# Patient Record
Sex: Female | Born: 1950 | Race: White | Hispanic: No | Marital: Married
Health system: Southern US, Academic
[De-identification: ages and names within clinical notes are randomized; demographics above are authoritative.]

## PROBLEM LIST (undated history)

## (undated) ENCOUNTER — Encounter: Attending: Nephrology | Primary: Nephrology

## (undated) ENCOUNTER — Encounter

## (undated) ENCOUNTER — Telehealth

## (undated) ENCOUNTER — Ambulatory Visit: Payer: MEDICARE

## (undated) ENCOUNTER — Ambulatory Visit: Payer: MEDICARE | Attending: Internal Medicine | Primary: Internal Medicine

## (undated) ENCOUNTER — Encounter: Attending: Cardiovascular Disease | Primary: Cardiovascular Disease

## (undated) ENCOUNTER — Encounter: Attending: Adult Health | Primary: Adult Health

## (undated) ENCOUNTER — Telehealth: Attending: Hematology & Oncology | Primary: Hematology & Oncology

## (undated) ENCOUNTER — Encounter: Attending: Infectious Disease | Primary: Infectious Disease

## (undated) ENCOUNTER — Encounter: Attending: Oncology | Primary: Oncology

## (undated) ENCOUNTER — Ambulatory Visit

## (undated) ENCOUNTER — Ambulatory Visit: Attending: Clinical | Primary: Clinical

## (undated) ENCOUNTER — Ambulatory Visit: Payer: MEDICARE | Attending: Nephrology | Primary: Nephrology

## (undated) ENCOUNTER — Encounter: Attending: Internal Medicine | Primary: Internal Medicine

## (undated) ENCOUNTER — Encounter: Attending: Hematology & Oncology | Primary: Hematology & Oncology

## (undated) ENCOUNTER — Other Ambulatory Visit: Attending: Clinical | Primary: Clinical

## (undated) ENCOUNTER — Ambulatory Visit: Attending: Nurse Practitioner | Primary: Nurse Practitioner

## (undated) ENCOUNTER — Ambulatory Visit: Payer: MEDICARE | Attending: Adult Health | Primary: Adult Health

## (undated) ENCOUNTER — Encounter: Attending: Dermatology | Primary: Dermatology

## (undated) ENCOUNTER — Ambulatory Visit
Payer: MEDICARE | Attending: Adult Reconstructive Orthopaedic Surgery | Primary: Adult Reconstructive Orthopaedic Surgery

## (undated) ENCOUNTER — Ambulatory Visit: Payer: MEDICARE | Attending: MOHS-Micrographic Surgery | Primary: MOHS-Micrographic Surgery

## (undated) ENCOUNTER — Ambulatory Visit: Payer: MEDICARE | Attending: Oncology | Primary: Oncology

## (undated) ENCOUNTER — Telehealth: Attending: Nephrology | Primary: Nephrology

## (undated) ENCOUNTER — Telehealth: Attending: Internal Medicine | Primary: Internal Medicine

## (undated) ENCOUNTER — Encounter: Attending: Clinical | Primary: Clinical

## (undated) ENCOUNTER — Encounter: Attending: Nurse Practitioner | Primary: Nurse Practitioner

## (undated) ENCOUNTER — Encounter
Attending: Student in an Organized Health Care Education/Training Program | Primary: Student in an Organized Health Care Education/Training Program

## (undated) ENCOUNTER — Ambulatory Visit: Payer: MEDICARE | Attending: Gerontology | Primary: Gerontology

## (undated) ENCOUNTER — Telehealth: Attending: Adult Health | Primary: Adult Health

## (undated) ENCOUNTER — Ambulatory Visit: Attending: Radiation Oncology | Primary: Radiation Oncology

## (undated) ENCOUNTER — Ambulatory Visit: Payer: MEDICARE | Attending: Dermatology | Primary: Dermatology

## (undated) ENCOUNTER — Ambulatory Visit
Payer: MEDICARE | Attending: Student in an Organized Health Care Education/Training Program | Primary: Student in an Organized Health Care Education/Training Program

## (undated) ENCOUNTER — Encounter: Attending: Diagnostic Radiology | Primary: Diagnostic Radiology

## (undated) ENCOUNTER — Ambulatory Visit
Payer: MEDICARE | Attending: Rehabilitative and Restorative Service Providers" | Primary: Rehabilitative and Restorative Service Providers"

## (undated) ENCOUNTER — Telehealth: Attending: MOHS-Micrographic Surgery | Primary: MOHS-Micrographic Surgery

## (undated) ENCOUNTER — Other Ambulatory Visit

## (undated) DIAGNOSIS — N19 Unspecified kidney failure: Secondary | ICD-10-CM

## (undated) DIAGNOSIS — M109 Gout, unspecified: Secondary | ICD-10-CM

## (undated) DIAGNOSIS — D473 Essential (hemorrhagic) thrombocythemia: Secondary | ICD-10-CM

## (undated) DIAGNOSIS — D649 Anemia, unspecified: Secondary | ICD-10-CM

## (undated) DIAGNOSIS — G4730 Sleep apnea, unspecified: Secondary | ICD-10-CM

## (undated) DIAGNOSIS — N189 Chronic kidney disease, unspecified: Secondary | ICD-10-CM

## (undated) DIAGNOSIS — R011 Cardiac murmur, unspecified: Secondary | ICD-10-CM

## (undated) DIAGNOSIS — M1990 Unspecified osteoarthritis, unspecified site: Secondary | ICD-10-CM

## (undated) DIAGNOSIS — I509 Heart failure, unspecified: Secondary | ICD-10-CM

## (undated) DIAGNOSIS — J811 Chronic pulmonary edema: Principal | ICD-10-CM

## (undated) DIAGNOSIS — N390 Urinary tract infection, site not specified: Secondary | ICD-10-CM

## (undated) DIAGNOSIS — I10 Essential (primary) hypertension: Secondary | ICD-10-CM

## (undated) HISTORY — PX: TUBAL LIGATION: SHX77

## (undated) HISTORY — DX: Chronic kidney disease, unspecified: N18.9

## (undated) HISTORY — DX: Sleep apnea, unspecified: G47.30

## (undated) HISTORY — DX: Chronic pulmonary edema: J81.1

## (undated) HISTORY — DX: Anemia, unspecified: D64.9

## (undated) HISTORY — PX: HAND SURGERY: SHX662

## (undated) HISTORY — DX: Gout, unspecified: M10.9

## (undated) HISTORY — PX: COLONOSCOPY: SHX174

## (undated) HISTORY — PX: PORT-A-CATH REMOVAL: SHX5289

## (undated) HISTORY — DX: Unspecified kidney failure: N19

## (undated) HISTORY — DX: Essential (hemorrhagic) thrombocythemia: D47.3

## (undated) HISTORY — PX: PORTACATH PLACEMENT: SHX2246

## (undated) MED ORDER — HYDROXYUREA 500 MG CAPSULE: 0 days

## (undated) MED ORDER — OXYGEN-AIR DELIVERY SYSTEMS MISC: 0 days

## (undated) MED ORDER — MEDICAL SUPPLY ITEM: Freq: Every evening | NASAL | 0 days

## (undated) MED ORDER — ARANESP 200 MCG/0.4 ML (IN POLYSORBATE) INJECTION SYRINGE: Freq: Once | SUBCUTANEOUS | 0.00000 days | PRN

---
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
## 1984-07-12 HISTORY — PX: BREAST BIOPSY: SHX20

## 2004-04-11 ENCOUNTER — Ambulatory Visit: Payer: Self-pay | Admitting: Internal Medicine

## 2004-06-12 ENCOUNTER — Ambulatory Visit: Payer: Self-pay | Admitting: Internal Medicine

## 2004-07-27 ENCOUNTER — Ambulatory Visit: Payer: Self-pay | Admitting: Internal Medicine

## 2004-10-09 ENCOUNTER — Ambulatory Visit: Payer: Self-pay | Admitting: Internal Medicine

## 2005-03-10 ENCOUNTER — Ambulatory Visit: Payer: Self-pay | Admitting: Internal Medicine

## 2005-03-12 ENCOUNTER — Ambulatory Visit: Payer: Self-pay | Admitting: Internal Medicine

## 2005-05-21 ENCOUNTER — Ambulatory Visit: Payer: Self-pay | Admitting: Internal Medicine

## 2005-06-07 ENCOUNTER — Ambulatory Visit: Payer: Self-pay | Admitting: Internal Medicine

## 2005-07-12 DIAGNOSIS — N189 Chronic kidney disease, unspecified: Secondary | ICD-10-CM

## 2005-07-12 HISTORY — DX: Chronic kidney disease, unspecified: N18.9

## 2005-08-23 ENCOUNTER — Ambulatory Visit: Payer: Self-pay | Admitting: Internal Medicine

## 2005-09-09 ENCOUNTER — Ambulatory Visit: Payer: Self-pay | Admitting: Internal Medicine

## 2005-10-10 ENCOUNTER — Ambulatory Visit: Payer: Self-pay | Admitting: Internal Medicine

## 2005-11-23 ENCOUNTER — Ambulatory Visit: Payer: Self-pay | Admitting: Internal Medicine

## 2006-01-13 ENCOUNTER — Ambulatory Visit: Payer: Self-pay | Admitting: Internal Medicine

## 2006-04-07 ENCOUNTER — Ambulatory Visit: Payer: Self-pay | Admitting: Internal Medicine

## 2006-04-11 ENCOUNTER — Ambulatory Visit: Payer: Self-pay | Admitting: Internal Medicine

## 2006-05-12 ENCOUNTER — Ambulatory Visit: Payer: Self-pay | Admitting: Internal Medicine

## 2006-06-11 ENCOUNTER — Ambulatory Visit: Payer: Self-pay | Admitting: Internal Medicine

## 2006-06-22 ENCOUNTER — Ambulatory Visit: Payer: Self-pay | Admitting: Gastroenterology

## 2006-07-12 ENCOUNTER — Ambulatory Visit: Payer: Self-pay | Admitting: Internal Medicine

## 2006-08-10 ENCOUNTER — Ambulatory Visit: Payer: Self-pay | Admitting: Internal Medicine

## 2006-08-12 ENCOUNTER — Ambulatory Visit: Payer: Self-pay | Admitting: Internal Medicine

## 2006-09-10 ENCOUNTER — Ambulatory Visit: Payer: Self-pay | Admitting: Internal Medicine

## 2006-10-11 ENCOUNTER — Ambulatory Visit: Payer: Self-pay | Admitting: Internal Medicine

## 2006-11-02 ENCOUNTER — Ambulatory Visit: Payer: Self-pay | Admitting: Internal Medicine

## 2006-11-10 ENCOUNTER — Ambulatory Visit: Payer: Self-pay | Admitting: Internal Medicine

## 2006-12-11 ENCOUNTER — Ambulatory Visit: Payer: Self-pay | Admitting: Internal Medicine

## 2007-01-16 ENCOUNTER — Ambulatory Visit: Payer: Self-pay | Admitting: Internal Medicine

## 2007-02-10 ENCOUNTER — Ambulatory Visit: Payer: Self-pay | Admitting: Internal Medicine

## 2007-03-13 ENCOUNTER — Ambulatory Visit: Payer: Self-pay | Admitting: Internal Medicine

## 2007-04-12 ENCOUNTER — Ambulatory Visit: Payer: Self-pay | Admitting: Internal Medicine

## 2007-05-13 ENCOUNTER — Ambulatory Visit: Payer: Self-pay | Admitting: Internal Medicine

## 2007-06-12 ENCOUNTER — Ambulatory Visit: Payer: Self-pay | Admitting: Internal Medicine

## 2007-07-13 ENCOUNTER — Ambulatory Visit: Payer: Self-pay | Admitting: Internal Medicine

## 2007-08-13 ENCOUNTER — Ambulatory Visit: Payer: Self-pay | Admitting: Internal Medicine

## 2007-09-10 ENCOUNTER — Ambulatory Visit: Payer: Self-pay | Admitting: Internal Medicine

## 2007-10-11 ENCOUNTER — Ambulatory Visit: Payer: Self-pay | Admitting: Internal Medicine

## 2007-11-10 ENCOUNTER — Ambulatory Visit: Payer: Self-pay | Admitting: Internal Medicine

## 2007-12-11 ENCOUNTER — Ambulatory Visit: Payer: Self-pay | Admitting: Internal Medicine

## 2008-01-10 ENCOUNTER — Ambulatory Visit: Payer: Self-pay | Admitting: Internal Medicine

## 2008-02-10 ENCOUNTER — Ambulatory Visit: Payer: Self-pay | Admitting: Internal Medicine

## 2008-03-12 ENCOUNTER — Ambulatory Visit: Payer: Self-pay | Admitting: Internal Medicine

## 2008-04-11 ENCOUNTER — Ambulatory Visit: Payer: Self-pay | Admitting: Internal Medicine

## 2008-05-12 ENCOUNTER — Ambulatory Visit: Payer: Self-pay | Admitting: Internal Medicine

## 2008-05-15 ENCOUNTER — Ambulatory Visit: Payer: Self-pay | Admitting: Internal Medicine

## 2008-06-11 ENCOUNTER — Ambulatory Visit: Payer: Self-pay | Admitting: Internal Medicine

## 2008-07-12 ENCOUNTER — Ambulatory Visit: Payer: Self-pay | Admitting: Internal Medicine

## 2008-07-12 LAB — HM COLONOSCOPY

## 2008-08-12 ENCOUNTER — Ambulatory Visit: Payer: Self-pay | Admitting: Internal Medicine

## 2008-08-28 ENCOUNTER — Ambulatory Visit: Payer: Self-pay | Admitting: General Practice

## 2008-09-09 ENCOUNTER — Ambulatory Visit: Payer: Self-pay | Admitting: Internal Medicine

## 2008-10-10 ENCOUNTER — Ambulatory Visit: Payer: Self-pay | Admitting: Internal Medicine

## 2008-11-09 ENCOUNTER — Ambulatory Visit: Payer: Self-pay | Admitting: Internal Medicine

## 2008-12-10 ENCOUNTER — Ambulatory Visit: Payer: Self-pay | Admitting: Internal Medicine

## 2009-01-09 ENCOUNTER — Ambulatory Visit: Payer: Self-pay | Admitting: Internal Medicine

## 2009-02-09 ENCOUNTER — Ambulatory Visit: Payer: Self-pay | Admitting: Internal Medicine

## 2009-03-12 ENCOUNTER — Ambulatory Visit: Payer: Self-pay | Admitting: Internal Medicine

## 2009-04-08 ENCOUNTER — Ambulatory Visit: Payer: Self-pay | Admitting: Internal Medicine

## 2009-04-11 ENCOUNTER — Ambulatory Visit: Payer: Self-pay | Admitting: Internal Medicine

## 2009-05-15 ENCOUNTER — Emergency Department: Payer: Self-pay | Admitting: Emergency Medicine

## 2009-07-12 ENCOUNTER — Ambulatory Visit: Payer: Self-pay | Admitting: Internal Medicine

## 2009-07-28 ENCOUNTER — Ambulatory Visit: Payer: Self-pay | Admitting: Internal Medicine

## 2009-08-12 ENCOUNTER — Ambulatory Visit: Payer: Self-pay | Admitting: Internal Medicine

## 2009-09-02 ENCOUNTER — Ambulatory Visit: Payer: Self-pay | Admitting: Internal Medicine

## 2009-11-09 ENCOUNTER — Ambulatory Visit: Payer: Self-pay | Admitting: Internal Medicine

## 2009-11-17 ENCOUNTER — Ambulatory Visit: Payer: Self-pay | Admitting: Internal Medicine

## 2010-01-09 ENCOUNTER — Ambulatory Visit: Payer: Self-pay | Admitting: Internal Medicine

## 2010-05-04 ENCOUNTER — Ambulatory Visit: Payer: Self-pay | Admitting: Internal Medicine

## 2010-05-12 ENCOUNTER — Ambulatory Visit: Payer: Self-pay | Admitting: Internal Medicine

## 2010-09-09 ENCOUNTER — Ambulatory Visit: Payer: Self-pay | Admitting: Internal Medicine

## 2010-10-14 ENCOUNTER — Ambulatory Visit: Payer: Self-pay | Admitting: Internal Medicine

## 2010-11-10 ENCOUNTER — Ambulatory Visit: Payer: Self-pay | Admitting: Internal Medicine

## 2011-03-31 ENCOUNTER — Ambulatory Visit: Payer: Self-pay | Admitting: Internal Medicine

## 2011-04-12 ENCOUNTER — Ambulatory Visit: Payer: Self-pay | Admitting: Internal Medicine

## 2011-07-15 DIAGNOSIS — N189 Chronic kidney disease, unspecified: Secondary | ICD-10-CM | POA: Diagnosis not present

## 2011-07-15 DIAGNOSIS — D631 Anemia in chronic kidney disease: Secondary | ICD-10-CM | POA: Diagnosis not present

## 2011-07-27 DIAGNOSIS — N039 Chronic nephritic syndrome with unspecified morphologic changes: Secondary | ICD-10-CM | POA: Diagnosis not present

## 2011-07-27 DIAGNOSIS — N184 Chronic kidney disease, stage 4 (severe): Secondary | ICD-10-CM | POA: Diagnosis not present

## 2011-07-27 DIAGNOSIS — N189 Chronic kidney disease, unspecified: Secondary | ICD-10-CM | POA: Diagnosis not present

## 2011-08-10 DIAGNOSIS — N039 Chronic nephritic syndrome with unspecified morphologic changes: Secondary | ICD-10-CM | POA: Diagnosis not present

## 2011-08-10 DIAGNOSIS — N189 Chronic kidney disease, unspecified: Secondary | ICD-10-CM | POA: Diagnosis not present

## 2011-08-17 DIAGNOSIS — D631 Anemia in chronic kidney disease: Secondary | ICD-10-CM | POA: Diagnosis not present

## 2011-08-17 DIAGNOSIS — N189 Chronic kidney disease, unspecified: Secondary | ICD-10-CM | POA: Diagnosis not present

## 2011-08-24 DIAGNOSIS — N039 Chronic nephritic syndrome with unspecified morphologic changes: Secondary | ICD-10-CM | POA: Diagnosis not present

## 2011-08-24 DIAGNOSIS — N189 Chronic kidney disease, unspecified: Secondary | ICD-10-CM | POA: Diagnosis not present

## 2011-08-24 DIAGNOSIS — N184 Chronic kidney disease, stage 4 (severe): Secondary | ICD-10-CM | POA: Diagnosis not present

## 2011-09-01 DIAGNOSIS — N184 Chronic kidney disease, stage 4 (severe): Secondary | ICD-10-CM | POA: Diagnosis not present

## 2011-09-07 DIAGNOSIS — N184 Chronic kidney disease, stage 4 (severe): Secondary | ICD-10-CM | POA: Diagnosis not present

## 2011-09-07 DIAGNOSIS — N189 Chronic kidney disease, unspecified: Secondary | ICD-10-CM | POA: Diagnosis not present

## 2011-09-07 DIAGNOSIS — N039 Chronic nephritic syndrome with unspecified morphologic changes: Secondary | ICD-10-CM | POA: Diagnosis not present

## 2011-09-07 DIAGNOSIS — N200 Calculus of kidney: Secondary | ICD-10-CM | POA: Diagnosis not present

## 2011-09-15 ENCOUNTER — Ambulatory Visit: Payer: Self-pay | Admitting: Internal Medicine

## 2011-09-15 DIAGNOSIS — Z1231 Encounter for screening mammogram for malignant neoplasm of breast: Secondary | ICD-10-CM | POA: Diagnosis not present

## 2011-09-15 LAB — HM MAMMOGRAPHY: HM MAMMO: NEGATIVE

## 2011-09-20 ENCOUNTER — Ambulatory Visit: Payer: Self-pay | Admitting: Internal Medicine

## 2011-09-20 DIAGNOSIS — I129 Hypertensive chronic kidney disease with stage 1 through stage 4 chronic kidney disease, or unspecified chronic kidney disease: Secondary | ICD-10-CM | POA: Diagnosis not present

## 2011-09-20 DIAGNOSIS — N186 End stage renal disease: Secondary | ICD-10-CM | POA: Diagnosis not present

## 2011-09-20 DIAGNOSIS — E669 Obesity, unspecified: Secondary | ICD-10-CM | POA: Diagnosis not present

## 2011-09-20 DIAGNOSIS — Z79899 Other long term (current) drug therapy: Secondary | ICD-10-CM | POA: Diagnosis not present

## 2011-09-20 DIAGNOSIS — D473 Essential (hemorrhagic) thrombocythemia: Secondary | ICD-10-CM | POA: Diagnosis not present

## 2011-09-20 DIAGNOSIS — D631 Anemia in chronic kidney disease: Secondary | ICD-10-CM | POA: Diagnosis not present

## 2011-09-21 DIAGNOSIS — N039 Chronic nephritic syndrome with unspecified morphologic changes: Secondary | ICD-10-CM | POA: Diagnosis not present

## 2011-09-21 DIAGNOSIS — D473 Essential (hemorrhagic) thrombocythemia: Secondary | ICD-10-CM | POA: Diagnosis not present

## 2011-09-28 DIAGNOSIS — N039 Chronic nephritic syndrome with unspecified morphologic changes: Secondary | ICD-10-CM | POA: Diagnosis not present

## 2011-09-28 DIAGNOSIS — M109 Gout, unspecified: Secondary | ICD-10-CM | POA: Diagnosis not present

## 2011-09-28 DIAGNOSIS — D473 Essential (hemorrhagic) thrombocythemia: Secondary | ICD-10-CM | POA: Diagnosis not present

## 2011-10-11 ENCOUNTER — Ambulatory Visit: Payer: Self-pay | Admitting: Internal Medicine

## 2011-10-12 DIAGNOSIS — N039 Chronic nephritic syndrome with unspecified morphologic changes: Secondary | ICD-10-CM | POA: Diagnosis not present

## 2011-10-12 DIAGNOSIS — D473 Essential (hemorrhagic) thrombocythemia: Secondary | ICD-10-CM | POA: Diagnosis not present

## 2011-10-26 DIAGNOSIS — M109 Gout, unspecified: Secondary | ICD-10-CM | POA: Diagnosis not present

## 2011-10-26 DIAGNOSIS — D631 Anemia in chronic kidney disease: Secondary | ICD-10-CM | POA: Diagnosis not present

## 2011-11-09 DIAGNOSIS — N184 Chronic kidney disease, stage 4 (severe): Secondary | ICD-10-CM | POA: Diagnosis not present

## 2011-11-09 DIAGNOSIS — D631 Anemia in chronic kidney disease: Secondary | ICD-10-CM | POA: Diagnosis not present

## 2011-11-09 DIAGNOSIS — D473 Essential (hemorrhagic) thrombocythemia: Secondary | ICD-10-CM | POA: Diagnosis not present

## 2011-11-23 DIAGNOSIS — D473 Essential (hemorrhagic) thrombocythemia: Secondary | ICD-10-CM | POA: Diagnosis not present

## 2011-11-23 DIAGNOSIS — D631 Anemia in chronic kidney disease: Secondary | ICD-10-CM | POA: Diagnosis not present

## 2011-12-07 DIAGNOSIS — N039 Chronic nephritic syndrome with unspecified morphologic changes: Secondary | ICD-10-CM | POA: Diagnosis not present

## 2011-12-07 DIAGNOSIS — N184 Chronic kidney disease, stage 4 (severe): Secondary | ICD-10-CM | POA: Diagnosis not present

## 2011-12-07 DIAGNOSIS — D473 Essential (hemorrhagic) thrombocythemia: Secondary | ICD-10-CM | POA: Diagnosis not present

## 2011-12-08 DIAGNOSIS — M1990 Unspecified osteoarthritis, unspecified site: Secondary | ICD-10-CM | POA: Diagnosis not present

## 2011-12-21 DIAGNOSIS — D631 Anemia in chronic kidney disease: Secondary | ICD-10-CM | POA: Diagnosis not present

## 2011-12-21 DIAGNOSIS — D473 Essential (hemorrhagic) thrombocythemia: Secondary | ICD-10-CM | POA: Diagnosis not present

## 2012-01-04 DIAGNOSIS — D473 Essential (hemorrhagic) thrombocythemia: Secondary | ICD-10-CM | POA: Diagnosis not present

## 2012-01-04 DIAGNOSIS — N039 Chronic nephritic syndrome with unspecified morphologic changes: Secondary | ICD-10-CM | POA: Diagnosis not present

## 2012-01-05 DIAGNOSIS — N184 Chronic kidney disease, stage 4 (severe): Secondary | ICD-10-CM | POA: Diagnosis not present

## 2012-01-25 DIAGNOSIS — N184 Chronic kidney disease, stage 4 (severe): Secondary | ICD-10-CM | POA: Diagnosis not present

## 2012-01-25 DIAGNOSIS — D631 Anemia in chronic kidney disease: Secondary | ICD-10-CM | POA: Diagnosis not present

## 2012-01-25 DIAGNOSIS — D473 Essential (hemorrhagic) thrombocythemia: Secondary | ICD-10-CM | POA: Diagnosis not present

## 2012-02-01 DIAGNOSIS — N184 Chronic kidney disease, stage 4 (severe): Secondary | ICD-10-CM | POA: Diagnosis not present

## 2012-02-02 DIAGNOSIS — D473 Essential (hemorrhagic) thrombocythemia: Secondary | ICD-10-CM | POA: Diagnosis not present

## 2012-02-02 DIAGNOSIS — N039 Chronic nephritic syndrome with unspecified morphologic changes: Secondary | ICD-10-CM | POA: Diagnosis not present

## 2012-02-15 DIAGNOSIS — D473 Essential (hemorrhagic) thrombocythemia: Secondary | ICD-10-CM | POA: Diagnosis not present

## 2012-02-15 DIAGNOSIS — D631 Anemia in chronic kidney disease: Secondary | ICD-10-CM | POA: Diagnosis not present

## 2012-02-29 DIAGNOSIS — D473 Essential (hemorrhagic) thrombocythemia: Secondary | ICD-10-CM | POA: Diagnosis not present

## 2012-02-29 DIAGNOSIS — D631 Anemia in chronic kidney disease: Secondary | ICD-10-CM | POA: Diagnosis not present

## 2012-03-01 DIAGNOSIS — D473 Essential (hemorrhagic) thrombocythemia: Secondary | ICD-10-CM | POA: Diagnosis not present

## 2012-03-01 DIAGNOSIS — D631 Anemia in chronic kidney disease: Secondary | ICD-10-CM | POA: Diagnosis not present

## 2012-03-06 ENCOUNTER — Ambulatory Visit: Payer: Self-pay | Admitting: Internal Medicine

## 2012-03-06 DIAGNOSIS — Z79899 Other long term (current) drug therapy: Secondary | ICD-10-CM | POA: Diagnosis not present

## 2012-03-06 DIAGNOSIS — D473 Essential (hemorrhagic) thrombocythemia: Secondary | ICD-10-CM | POA: Diagnosis not present

## 2012-03-06 DIAGNOSIS — N189 Chronic kidney disease, unspecified: Secondary | ICD-10-CM | POA: Diagnosis not present

## 2012-03-06 DIAGNOSIS — D631 Anemia in chronic kidney disease: Secondary | ICD-10-CM | POA: Diagnosis not present

## 2012-03-06 DIAGNOSIS — E669 Obesity, unspecified: Secondary | ICD-10-CM | POA: Diagnosis not present

## 2012-03-06 DIAGNOSIS — I129 Hypertensive chronic kidney disease with stage 1 through stage 4 chronic kidney disease, or unspecified chronic kidney disease: Secondary | ICD-10-CM | POA: Diagnosis not present

## 2012-03-07 DIAGNOSIS — N186 End stage renal disease: Secondary | ICD-10-CM | POA: Diagnosis not present

## 2012-03-07 DIAGNOSIS — N184 Chronic kidney disease, stage 4 (severe): Secondary | ICD-10-CM | POA: Diagnosis not present

## 2012-03-07 DIAGNOSIS — D473 Essential (hemorrhagic) thrombocythemia: Secondary | ICD-10-CM | POA: Diagnosis not present

## 2012-03-07 DIAGNOSIS — D631 Anemia in chronic kidney disease: Secondary | ICD-10-CM | POA: Diagnosis not present

## 2012-03-12 ENCOUNTER — Ambulatory Visit: Payer: Self-pay | Admitting: Internal Medicine

## 2012-03-21 DIAGNOSIS — D473 Essential (hemorrhagic) thrombocythemia: Secondary | ICD-10-CM | POA: Diagnosis not present

## 2012-03-21 DIAGNOSIS — N039 Chronic nephritic syndrome with unspecified morphologic changes: Secondary | ICD-10-CM | POA: Diagnosis not present

## 2012-03-28 DIAGNOSIS — N039 Chronic nephritic syndrome with unspecified morphologic changes: Secondary | ICD-10-CM | POA: Diagnosis not present

## 2012-03-28 DIAGNOSIS — D473 Essential (hemorrhagic) thrombocythemia: Secondary | ICD-10-CM | POA: Diagnosis not present

## 2012-03-28 DIAGNOSIS — N184 Chronic kidney disease, stage 4 (severe): Secondary | ICD-10-CM | POA: Diagnosis not present

## 2012-04-10 DIAGNOSIS — N039 Chronic nephritic syndrome with unspecified morphologic changes: Secondary | ICD-10-CM | POA: Diagnosis not present

## 2012-04-10 DIAGNOSIS — D473 Essential (hemorrhagic) thrombocythemia: Secondary | ICD-10-CM | POA: Diagnosis not present

## 2012-04-25 DIAGNOSIS — D473 Essential (hemorrhagic) thrombocythemia: Secondary | ICD-10-CM | POA: Diagnosis not present

## 2012-04-25 DIAGNOSIS — N039 Chronic nephritic syndrome with unspecified morphologic changes: Secondary | ICD-10-CM | POA: Diagnosis not present

## 2012-05-01 DIAGNOSIS — R05 Cough: Secondary | ICD-10-CM | POA: Diagnosis not present

## 2012-05-01 DIAGNOSIS — Z23 Encounter for immunization: Secondary | ICD-10-CM | POA: Diagnosis not present

## 2012-05-02 ENCOUNTER — Ambulatory Visit: Payer: Self-pay | Admitting: Internal Medicine

## 2012-05-02 DIAGNOSIS — R059 Cough, unspecified: Secondary | ICD-10-CM | POA: Diagnosis not present

## 2012-05-02 DIAGNOSIS — D473 Essential (hemorrhagic) thrombocythemia: Secondary | ICD-10-CM | POA: Diagnosis not present

## 2012-05-02 DIAGNOSIS — I517 Cardiomegaly: Secondary | ICD-10-CM | POA: Diagnosis not present

## 2012-05-02 DIAGNOSIS — N039 Chronic nephritic syndrome with unspecified morphologic changes: Secondary | ICD-10-CM | POA: Diagnosis not present

## 2012-05-02 DIAGNOSIS — R05 Cough: Secondary | ICD-10-CM | POA: Diagnosis not present

## 2012-05-02 DIAGNOSIS — N184 Chronic kidney disease, stage 4 (severe): Secondary | ICD-10-CM | POA: Diagnosis not present

## 2012-05-16 DIAGNOSIS — I517 Cardiomegaly: Secondary | ICD-10-CM | POA: Diagnosis not present

## 2012-05-16 DIAGNOSIS — Z01818 Encounter for other preprocedural examination: Secondary | ICD-10-CM | POA: Diagnosis not present

## 2012-05-16 DIAGNOSIS — R9431 Abnormal electrocardiogram [ECG] [EKG]: Secondary | ICD-10-CM | POA: Diagnosis not present

## 2012-05-16 DIAGNOSIS — N184 Chronic kidney disease, stage 4 (severe): Secondary | ICD-10-CM | POA: Diagnosis not present

## 2012-05-16 DIAGNOSIS — I2510 Atherosclerotic heart disease of native coronary artery without angina pectoris: Secondary | ICD-10-CM | POA: Diagnosis not present

## 2012-05-23 DIAGNOSIS — N039 Chronic nephritic syndrome with unspecified morphologic changes: Secondary | ICD-10-CM | POA: Diagnosis not present

## 2012-05-23 DIAGNOSIS — D473 Essential (hemorrhagic) thrombocythemia: Secondary | ICD-10-CM | POA: Diagnosis not present

## 2012-06-13 DIAGNOSIS — D473 Essential (hemorrhagic) thrombocythemia: Secondary | ICD-10-CM | POA: Diagnosis not present

## 2012-06-13 DIAGNOSIS — N184 Chronic kidney disease, stage 4 (severe): Secondary | ICD-10-CM | POA: Diagnosis not present

## 2012-06-13 DIAGNOSIS — D631 Anemia in chronic kidney disease: Secondary | ICD-10-CM | POA: Diagnosis not present

## 2012-06-27 DIAGNOSIS — D473 Essential (hemorrhagic) thrombocythemia: Secondary | ICD-10-CM | POA: Diagnosis not present

## 2012-06-27 DIAGNOSIS — D631 Anemia in chronic kidney disease: Secondary | ICD-10-CM | POA: Diagnosis not present

## 2012-07-03 DIAGNOSIS — D473 Essential (hemorrhagic) thrombocythemia: Secondary | ICD-10-CM | POA: Diagnosis not present

## 2012-07-03 DIAGNOSIS — D631 Anemia in chronic kidney disease: Secondary | ICD-10-CM | POA: Diagnosis not present

## 2012-07-12 DIAGNOSIS — N19 Unspecified kidney failure: Secondary | ICD-10-CM

## 2012-07-12 DIAGNOSIS — J811 Chronic pulmonary edema: Secondary | ICD-10-CM

## 2012-07-12 HISTORY — DX: Unspecified kidney failure: N19

## 2012-07-12 HISTORY — DX: Chronic pulmonary edema: J81.1

## 2012-07-17 DIAGNOSIS — N039 Chronic nephritic syndrome with unspecified morphologic changes: Secondary | ICD-10-CM | POA: Diagnosis not present

## 2012-07-17 DIAGNOSIS — D473 Essential (hemorrhagic) thrombocythemia: Secondary | ICD-10-CM | POA: Diagnosis not present

## 2012-07-25 DIAGNOSIS — N184 Chronic kidney disease, stage 4 (severe): Secondary | ICD-10-CM | POA: Diagnosis not present

## 2012-07-25 DIAGNOSIS — N039 Chronic nephritic syndrome with unspecified morphologic changes: Secondary | ICD-10-CM | POA: Diagnosis not present

## 2012-08-07 DIAGNOSIS — D631 Anemia in chronic kidney disease: Secondary | ICD-10-CM | POA: Diagnosis not present

## 2012-08-07 DIAGNOSIS — D473 Essential (hemorrhagic) thrombocythemia: Secondary | ICD-10-CM | POA: Diagnosis not present

## 2012-08-12 ENCOUNTER — Ambulatory Visit: Payer: Self-pay | Admitting: Internal Medicine

## 2012-08-12 DIAGNOSIS — E669 Obesity, unspecified: Secondary | ICD-10-CM | POA: Diagnosis not present

## 2012-08-12 DIAGNOSIS — D631 Anemia in chronic kidney disease: Secondary | ICD-10-CM | POA: Diagnosis not present

## 2012-08-12 DIAGNOSIS — D473 Essential (hemorrhagic) thrombocythemia: Secondary | ICD-10-CM | POA: Diagnosis not present

## 2012-08-12 DIAGNOSIS — I129 Hypertensive chronic kidney disease with stage 1 through stage 4 chronic kidney disease, or unspecified chronic kidney disease: Secondary | ICD-10-CM | POA: Diagnosis not present

## 2012-08-12 DIAGNOSIS — N189 Chronic kidney disease, unspecified: Secondary | ICD-10-CM | POA: Diagnosis not present

## 2012-08-12 DIAGNOSIS — Z79899 Other long term (current) drug therapy: Secondary | ICD-10-CM | POA: Diagnosis not present

## 2012-08-12 DIAGNOSIS — R5381 Other malaise: Secondary | ICD-10-CM | POA: Diagnosis not present

## 2012-08-12 LAB — HM MAMMOGRAPHY

## 2012-08-15 DIAGNOSIS — N184 Chronic kidney disease, stage 4 (severe): Secondary | ICD-10-CM | POA: Diagnosis not present

## 2012-08-15 DIAGNOSIS — D631 Anemia in chronic kidney disease: Secondary | ICD-10-CM | POA: Diagnosis not present

## 2012-08-15 DIAGNOSIS — D473 Essential (hemorrhagic) thrombocythemia: Secondary | ICD-10-CM | POA: Diagnosis not present

## 2012-08-17 DIAGNOSIS — N189 Chronic kidney disease, unspecified: Secondary | ICD-10-CM | POA: Diagnosis not present

## 2012-08-23 DIAGNOSIS — D631 Anemia in chronic kidney disease: Secondary | ICD-10-CM | POA: Diagnosis not present

## 2012-08-23 DIAGNOSIS — D473 Essential (hemorrhagic) thrombocythemia: Secondary | ICD-10-CM | POA: Diagnosis not present

## 2012-08-25 DIAGNOSIS — D631 Anemia in chronic kidney disease: Secondary | ICD-10-CM | POA: Diagnosis not present

## 2012-08-25 DIAGNOSIS — Z79899 Other long term (current) drug therapy: Secondary | ICD-10-CM | POA: Diagnosis not present

## 2012-08-25 DIAGNOSIS — D473 Essential (hemorrhagic) thrombocythemia: Secondary | ICD-10-CM | POA: Diagnosis not present

## 2012-08-25 DIAGNOSIS — N189 Chronic kidney disease, unspecified: Secondary | ICD-10-CM | POA: Diagnosis not present

## 2012-08-29 DIAGNOSIS — N189 Chronic kidney disease, unspecified: Secondary | ICD-10-CM | POA: Diagnosis not present

## 2012-08-29 DIAGNOSIS — N184 Chronic kidney disease, stage 4 (severe): Secondary | ICD-10-CM | POA: Diagnosis not present

## 2012-08-29 DIAGNOSIS — D62 Acute posthemorrhagic anemia: Secondary | ICD-10-CM | POA: Diagnosis not present

## 2012-08-29 DIAGNOSIS — Z79899 Other long term (current) drug therapy: Secondary | ICD-10-CM | POA: Diagnosis not present

## 2012-08-29 DIAGNOSIS — D473 Essential (hemorrhagic) thrombocythemia: Secondary | ICD-10-CM | POA: Diagnosis not present

## 2012-08-29 DIAGNOSIS — I129 Hypertensive chronic kidney disease with stage 1 through stage 4 chronic kidney disease, or unspecified chronic kidney disease: Secondary | ICD-10-CM | POA: Diagnosis not present

## 2012-08-29 DIAGNOSIS — M109 Gout, unspecified: Secondary | ICD-10-CM | POA: Diagnosis not present

## 2012-09-05 DIAGNOSIS — D62 Acute posthemorrhagic anemia: Secondary | ICD-10-CM | POA: Diagnosis not present

## 2012-09-09 ENCOUNTER — Ambulatory Visit: Payer: Self-pay | Admitting: Internal Medicine

## 2012-09-12 DIAGNOSIS — D473 Essential (hemorrhagic) thrombocythemia: Secondary | ICD-10-CM | POA: Diagnosis not present

## 2012-09-21 ENCOUNTER — Ambulatory Visit: Payer: Self-pay | Admitting: Internal Medicine

## 2012-09-21 DIAGNOSIS — Z1231 Encounter for screening mammogram for malignant neoplasm of breast: Secondary | ICD-10-CM | POA: Diagnosis not present

## 2012-09-22 ENCOUNTER — Ambulatory Visit: Payer: Self-pay | Admitting: Internal Medicine

## 2012-09-22 DIAGNOSIS — Z79899 Other long term (current) drug therapy: Secondary | ICD-10-CM | POA: Diagnosis not present

## 2012-09-22 DIAGNOSIS — I129 Hypertensive chronic kidney disease with stage 1 through stage 4 chronic kidney disease, or unspecified chronic kidney disease: Secondary | ICD-10-CM | POA: Diagnosis not present

## 2012-09-22 DIAGNOSIS — R5381 Other malaise: Secondary | ICD-10-CM | POA: Diagnosis not present

## 2012-09-22 DIAGNOSIS — E669 Obesity, unspecified: Secondary | ICD-10-CM | POA: Diagnosis not present

## 2012-09-22 DIAGNOSIS — N186 End stage renal disease: Secondary | ICD-10-CM | POA: Diagnosis not present

## 2012-09-22 DIAGNOSIS — D631 Anemia in chronic kidney disease: Secondary | ICD-10-CM | POA: Diagnosis not present

## 2012-09-22 DIAGNOSIS — D473 Essential (hemorrhagic) thrombocythemia: Secondary | ICD-10-CM | POA: Diagnosis not present

## 2012-10-03 ENCOUNTER — Ambulatory Visit: Payer: Self-pay | Admitting: Internal Medicine

## 2012-10-03 DIAGNOSIS — J811 Chronic pulmonary edema: Secondary | ICD-10-CM | POA: Diagnosis not present

## 2012-10-03 DIAGNOSIS — R059 Cough, unspecified: Secondary | ICD-10-CM | POA: Diagnosis not present

## 2012-10-03 DIAGNOSIS — I517 Cardiomegaly: Secondary | ICD-10-CM | POA: Diagnosis not present

## 2012-10-06 DIAGNOSIS — I319 Disease of pericardium, unspecified: Secondary | ICD-10-CM | POA: Diagnosis not present

## 2012-10-06 DIAGNOSIS — Z87891 Personal history of nicotine dependence: Secondary | ICD-10-CM | POA: Diagnosis not present

## 2012-10-06 DIAGNOSIS — J984 Other disorders of lung: Secondary | ICD-10-CM | POA: Diagnosis not present

## 2012-10-06 DIAGNOSIS — I517 Cardiomegaly: Secondary | ICD-10-CM | POA: Diagnosis not present

## 2012-10-06 DIAGNOSIS — N184 Chronic kidney disease, stage 4 (severe): Secondary | ICD-10-CM | POA: Diagnosis not present

## 2012-10-06 DIAGNOSIS — R079 Chest pain, unspecified: Secondary | ICD-10-CM | POA: Diagnosis not present

## 2012-10-06 DIAGNOSIS — J90 Pleural effusion, not elsewhere classified: Secondary | ICD-10-CM | POA: Diagnosis not present

## 2012-10-06 DIAGNOSIS — N186 End stage renal disease: Secondary | ICD-10-CM | POA: Diagnosis not present

## 2012-10-06 DIAGNOSIS — N19 Unspecified kidney failure: Secondary | ICD-10-CM | POA: Diagnosis not present

## 2012-10-06 DIAGNOSIS — I2789 Other specified pulmonary heart diseases: Secondary | ICD-10-CM | POA: Diagnosis not present

## 2012-10-06 DIAGNOSIS — K7689 Other specified diseases of liver: Secondary | ICD-10-CM | POA: Diagnosis not present

## 2012-10-06 DIAGNOSIS — N039 Chronic nephritic syndrome with unspecified morphologic changes: Secondary | ICD-10-CM | POA: Diagnosis not present

## 2012-10-06 DIAGNOSIS — Z7682 Awaiting organ transplant status: Secondary | ICD-10-CM | POA: Diagnosis not present

## 2012-10-06 DIAGNOSIS — G4733 Obstructive sleep apnea (adult) (pediatric): Secondary | ICD-10-CM | POA: Diagnosis present

## 2012-10-06 DIAGNOSIS — R0602 Shortness of breath: Secondary | ICD-10-CM | POA: Diagnosis not present

## 2012-10-06 DIAGNOSIS — N008 Acute nephritic syndrome with other morphologic changes: Secondary | ICD-10-CM | POA: Diagnosis not present

## 2012-10-06 DIAGNOSIS — R0609 Other forms of dyspnea: Secondary | ICD-10-CM | POA: Diagnosis not present

## 2012-10-06 DIAGNOSIS — N179 Acute kidney failure, unspecified: Secondary | ICD-10-CM | POA: Diagnosis not present

## 2012-10-06 DIAGNOSIS — E669 Obesity, unspecified: Secondary | ICD-10-CM | POA: Diagnosis present

## 2012-10-06 DIAGNOSIS — I428 Other cardiomyopathies: Secondary | ICD-10-CM | POA: Diagnosis not present

## 2012-10-06 DIAGNOSIS — J810 Acute pulmonary edema: Secondary | ICD-10-CM | POA: Diagnosis present

## 2012-10-06 DIAGNOSIS — Q2572 Congenital pulmonary arteriovenous malformation: Secondary | ICD-10-CM | POA: Diagnosis not present

## 2012-10-06 DIAGNOSIS — D649 Anemia, unspecified: Secondary | ICD-10-CM | POA: Diagnosis not present

## 2012-10-06 DIAGNOSIS — R918 Other nonspecific abnormal finding of lung field: Secondary | ICD-10-CM | POA: Diagnosis not present

## 2012-10-06 DIAGNOSIS — D631 Anemia in chronic kidney disease: Secondary | ICD-10-CM | POA: Diagnosis present

## 2012-10-06 DIAGNOSIS — I120 Hypertensive chronic kidney disease with stage 5 chronic kidney disease or end stage renal disease: Secondary | ICD-10-CM | POA: Diagnosis not present

## 2012-10-06 DIAGNOSIS — R0902 Hypoxemia: Secondary | ICD-10-CM | POA: Diagnosis not present

## 2012-10-06 DIAGNOSIS — M7989 Other specified soft tissue disorders: Secondary | ICD-10-CM | POA: Diagnosis not present

## 2012-10-06 DIAGNOSIS — I509 Heart failure, unspecified: Secondary | ICD-10-CM | POA: Diagnosis not present

## 2012-10-06 DIAGNOSIS — I780 Hereditary hemorrhagic telangiectasia: Secondary | ICD-10-CM | POA: Diagnosis present

## 2012-10-06 DIAGNOSIS — J9819 Other pulmonary collapse: Secondary | ICD-10-CM | POA: Diagnosis not present

## 2012-10-06 DIAGNOSIS — I270 Primary pulmonary hypertension: Secondary | ICD-10-CM | POA: Diagnosis not present

## 2012-10-06 DIAGNOSIS — I129 Hypertensive chronic kidney disease with stage 1 through stage 4 chronic kidney disease, or unspecified chronic kidney disease: Secondary | ICD-10-CM | POA: Diagnosis not present

## 2012-10-06 DIAGNOSIS — D473 Essential (hemorrhagic) thrombocythemia: Secondary | ICD-10-CM | POA: Diagnosis present

## 2012-10-06 DIAGNOSIS — Z992 Dependence on renal dialysis: Secondary | ICD-10-CM | POA: Diagnosis not present

## 2012-10-06 DIAGNOSIS — M109 Gout, unspecified: Secondary | ICD-10-CM | POA: Diagnosis present

## 2012-10-06 DIAGNOSIS — J811 Chronic pulmonary edema: Secondary | ICD-10-CM | POA: Diagnosis not present

## 2012-10-06 DIAGNOSIS — Z452 Encounter for adjustment and management of vascular access device: Secondary | ICD-10-CM | POA: Diagnosis not present

## 2012-10-06 DIAGNOSIS — I5030 Unspecified diastolic (congestive) heart failure: Secondary | ICD-10-CM | POA: Diagnosis not present

## 2012-10-10 ENCOUNTER — Ambulatory Visit: Payer: Self-pay | Admitting: Internal Medicine

## 2012-10-28 DIAGNOSIS — D473 Essential (hemorrhagic) thrombocythemia: Secondary | ICD-10-CM | POA: Diagnosis not present

## 2012-10-28 DIAGNOSIS — D509 Iron deficiency anemia, unspecified: Secondary | ICD-10-CM | POA: Diagnosis not present

## 2012-10-28 DIAGNOSIS — N039 Chronic nephritic syndrome with unspecified morphologic changes: Secondary | ICD-10-CM | POA: Diagnosis not present

## 2012-10-28 DIAGNOSIS — D631 Anemia in chronic kidney disease: Secondary | ICD-10-CM | POA: Diagnosis not present

## 2012-10-28 DIAGNOSIS — N186 End stage renal disease: Secondary | ICD-10-CM | POA: Diagnosis not present

## 2012-10-31 DIAGNOSIS — D509 Iron deficiency anemia, unspecified: Secondary | ICD-10-CM | POA: Diagnosis not present

## 2012-10-31 DIAGNOSIS — N186 End stage renal disease: Secondary | ICD-10-CM | POA: Diagnosis not present

## 2012-10-31 DIAGNOSIS — D473 Essential (hemorrhagic) thrombocythemia: Secondary | ICD-10-CM | POA: Diagnosis not present

## 2012-10-31 DIAGNOSIS — D631 Anemia in chronic kidney disease: Secondary | ICD-10-CM | POA: Diagnosis not present

## 2012-11-02 DIAGNOSIS — N039 Chronic nephritic syndrome with unspecified morphologic changes: Secondary | ICD-10-CM | POA: Diagnosis not present

## 2012-11-02 DIAGNOSIS — N186 End stage renal disease: Secondary | ICD-10-CM | POA: Diagnosis not present

## 2012-11-02 DIAGNOSIS — D473 Essential (hemorrhagic) thrombocythemia: Secondary | ICD-10-CM | POA: Diagnosis not present

## 2012-11-02 DIAGNOSIS — D509 Iron deficiency anemia, unspecified: Secondary | ICD-10-CM | POA: Diagnosis not present

## 2012-11-03 DIAGNOSIS — J8410 Pulmonary fibrosis, unspecified: Secondary | ICD-10-CM | POA: Diagnosis not present

## 2012-11-03 DIAGNOSIS — E8779 Other fluid overload: Secondary | ICD-10-CM | POA: Diagnosis not present

## 2012-11-03 DIAGNOSIS — D649 Anemia, unspecified: Secondary | ICD-10-CM | POA: Diagnosis not present

## 2012-11-03 DIAGNOSIS — R0602 Shortness of breath: Secondary | ICD-10-CM | POA: Diagnosis not present

## 2012-11-03 DIAGNOSIS — J90 Pleural effusion, not elsewhere classified: Secondary | ICD-10-CM | POA: Diagnosis not present

## 2012-11-03 DIAGNOSIS — R0609 Other forms of dyspnea: Secondary | ICD-10-CM | POA: Diagnosis not present

## 2012-11-03 DIAGNOSIS — J811 Chronic pulmonary edema: Secondary | ICD-10-CM | POA: Diagnosis not present

## 2012-11-03 DIAGNOSIS — I517 Cardiomegaly: Secondary | ICD-10-CM | POA: Diagnosis not present

## 2012-11-03 DIAGNOSIS — R0902 Hypoxemia: Secondary | ICD-10-CM | POA: Diagnosis not present

## 2012-11-03 DIAGNOSIS — R0989 Other specified symptoms and signs involving the circulatory and respiratory systems: Secondary | ICD-10-CM | POA: Diagnosis not present

## 2012-11-04 DIAGNOSIS — D473 Essential (hemorrhagic) thrombocythemia: Secondary | ICD-10-CM | POA: Diagnosis not present

## 2012-11-04 DIAGNOSIS — N186 End stage renal disease: Secondary | ICD-10-CM | POA: Diagnosis not present

## 2012-11-04 DIAGNOSIS — N039 Chronic nephritic syndrome with unspecified morphologic changes: Secondary | ICD-10-CM | POA: Diagnosis not present

## 2012-11-04 DIAGNOSIS — D509 Iron deficiency anemia, unspecified: Secondary | ICD-10-CM | POA: Diagnosis not present

## 2012-11-07 DIAGNOSIS — D509 Iron deficiency anemia, unspecified: Secondary | ICD-10-CM | POA: Diagnosis not present

## 2012-11-07 DIAGNOSIS — D473 Essential (hemorrhagic) thrombocythemia: Secondary | ICD-10-CM | POA: Diagnosis not present

## 2012-11-07 DIAGNOSIS — N186 End stage renal disease: Secondary | ICD-10-CM | POA: Diagnosis not present

## 2012-11-07 DIAGNOSIS — D631 Anemia in chronic kidney disease: Secondary | ICD-10-CM | POA: Diagnosis not present

## 2012-11-08 DIAGNOSIS — J90 Pleural effusion, not elsewhere classified: Secondary | ICD-10-CM | POA: Diagnosis not present

## 2012-11-08 DIAGNOSIS — R9389 Abnormal findings on diagnostic imaging of other specified body structures: Secondary | ICD-10-CM | POA: Diagnosis not present

## 2012-11-08 DIAGNOSIS — I319 Disease of pericardium, unspecified: Secondary | ICD-10-CM | POA: Diagnosis not present

## 2012-11-08 DIAGNOSIS — I700 Atherosclerosis of aorta: Secondary | ICD-10-CM | POA: Diagnosis not present

## 2012-11-08 DIAGNOSIS — I517 Cardiomegaly: Secondary | ICD-10-CM | POA: Diagnosis not present

## 2012-11-08 DIAGNOSIS — R918 Other nonspecific abnormal finding of lung field: Secondary | ICD-10-CM | POA: Diagnosis not present

## 2012-11-08 DIAGNOSIS — J9819 Other pulmonary collapse: Secondary | ICD-10-CM | POA: Diagnosis not present

## 2012-11-09 DIAGNOSIS — N039 Chronic nephritic syndrome with unspecified morphologic changes: Secondary | ICD-10-CM | POA: Diagnosis not present

## 2012-11-09 DIAGNOSIS — D473 Essential (hemorrhagic) thrombocythemia: Secondary | ICD-10-CM | POA: Diagnosis not present

## 2012-11-09 DIAGNOSIS — D631 Anemia in chronic kidney disease: Secondary | ICD-10-CM | POA: Diagnosis not present

## 2012-11-09 DIAGNOSIS — N186 End stage renal disease: Secondary | ICD-10-CM | POA: Diagnosis not present

## 2012-11-10 DIAGNOSIS — I517 Cardiomegaly: Secondary | ICD-10-CM | POA: Diagnosis not present

## 2012-11-10 DIAGNOSIS — J811 Chronic pulmonary edema: Secondary | ICD-10-CM | POA: Diagnosis not present

## 2012-11-10 DIAGNOSIS — E8779 Other fluid overload: Secondary | ICD-10-CM | POA: Diagnosis not present

## 2012-11-10 DIAGNOSIS — R0902 Hypoxemia: Secondary | ICD-10-CM | POA: Diagnosis not present

## 2012-11-10 DIAGNOSIS — I10 Essential (primary) hypertension: Secondary | ICD-10-CM | POA: Diagnosis not present

## 2012-11-11 DIAGNOSIS — N039 Chronic nephritic syndrome with unspecified morphologic changes: Secondary | ICD-10-CM | POA: Diagnosis not present

## 2012-11-11 DIAGNOSIS — N186 End stage renal disease: Secondary | ICD-10-CM | POA: Diagnosis not present

## 2012-11-11 DIAGNOSIS — D473 Essential (hemorrhagic) thrombocythemia: Secondary | ICD-10-CM | POA: Diagnosis not present

## 2012-11-14 DIAGNOSIS — D473 Essential (hemorrhagic) thrombocythemia: Secondary | ICD-10-CM | POA: Diagnosis not present

## 2012-11-14 DIAGNOSIS — N186 End stage renal disease: Secondary | ICD-10-CM | POA: Diagnosis not present

## 2012-11-14 DIAGNOSIS — N039 Chronic nephritic syndrome with unspecified morphologic changes: Secondary | ICD-10-CM | POA: Diagnosis not present

## 2012-11-16 DIAGNOSIS — D631 Anemia in chronic kidney disease: Secondary | ICD-10-CM | POA: Diagnosis not present

## 2012-11-16 DIAGNOSIS — D473 Essential (hemorrhagic) thrombocythemia: Secondary | ICD-10-CM | POA: Diagnosis not present

## 2012-11-16 DIAGNOSIS — N186 End stage renal disease: Secondary | ICD-10-CM | POA: Diagnosis not present

## 2012-11-18 DIAGNOSIS — N039 Chronic nephritic syndrome with unspecified morphologic changes: Secondary | ICD-10-CM | POA: Diagnosis not present

## 2012-11-18 DIAGNOSIS — N186 End stage renal disease: Secondary | ICD-10-CM | POA: Diagnosis not present

## 2012-11-18 DIAGNOSIS — D473 Essential (hemorrhagic) thrombocythemia: Secondary | ICD-10-CM | POA: Diagnosis not present

## 2012-11-21 DIAGNOSIS — I509 Heart failure, unspecified: Secondary | ICD-10-CM | POA: Diagnosis not present

## 2012-11-21 DIAGNOSIS — N186 End stage renal disease: Secondary | ICD-10-CM | POA: Diagnosis not present

## 2012-11-21 DIAGNOSIS — N039 Chronic nephritic syndrome with unspecified morphologic changes: Secondary | ICD-10-CM | POA: Diagnosis not present

## 2012-11-23 DIAGNOSIS — I509 Heart failure, unspecified: Secondary | ICD-10-CM | POA: Diagnosis not present

## 2012-11-23 DIAGNOSIS — N039 Chronic nephritic syndrome with unspecified morphologic changes: Secondary | ICD-10-CM | POA: Diagnosis not present

## 2012-11-23 DIAGNOSIS — N186 End stage renal disease: Secondary | ICD-10-CM | POA: Diagnosis not present

## 2012-11-25 DIAGNOSIS — D631 Anemia in chronic kidney disease: Secondary | ICD-10-CM | POA: Diagnosis not present

## 2012-11-25 DIAGNOSIS — N186 End stage renal disease: Secondary | ICD-10-CM | POA: Diagnosis not present

## 2012-11-25 DIAGNOSIS — I509 Heart failure, unspecified: Secondary | ICD-10-CM | POA: Diagnosis not present

## 2012-11-28 DIAGNOSIS — D473 Essential (hemorrhagic) thrombocythemia: Secondary | ICD-10-CM | POA: Diagnosis not present

## 2012-11-28 DIAGNOSIS — N186 End stage renal disease: Secondary | ICD-10-CM | POA: Diagnosis not present

## 2012-11-28 DIAGNOSIS — N039 Chronic nephritic syndrome with unspecified morphologic changes: Secondary | ICD-10-CM | POA: Diagnosis not present

## 2012-11-29 DIAGNOSIS — D649 Anemia, unspecified: Secondary | ICD-10-CM | POA: Diagnosis not present

## 2012-11-30 DIAGNOSIS — N186 End stage renal disease: Secondary | ICD-10-CM | POA: Diagnosis not present

## 2012-11-30 DIAGNOSIS — D473 Essential (hemorrhagic) thrombocythemia: Secondary | ICD-10-CM | POA: Diagnosis not present

## 2012-11-30 DIAGNOSIS — N039 Chronic nephritic syndrome with unspecified morphologic changes: Secondary | ICD-10-CM | POA: Diagnosis not present

## 2012-12-01 DIAGNOSIS — Z79899 Other long term (current) drug therapy: Secondary | ICD-10-CM | POA: Diagnosis not present

## 2012-12-01 DIAGNOSIS — N184 Chronic kidney disease, stage 4 (severe): Secondary | ICD-10-CM | POA: Diagnosis not present

## 2012-12-01 DIAGNOSIS — D509 Iron deficiency anemia, unspecified: Secondary | ICD-10-CM | POA: Diagnosis not present

## 2012-12-01 DIAGNOSIS — R0902 Hypoxemia: Secondary | ICD-10-CM | POA: Diagnosis not present

## 2012-12-01 DIAGNOSIS — I780 Hereditary hemorrhagic telangiectasia: Secondary | ICD-10-CM | POA: Diagnosis not present

## 2012-12-01 DIAGNOSIS — I10 Essential (primary) hypertension: Secondary | ICD-10-CM | POA: Diagnosis not present

## 2012-12-01 DIAGNOSIS — I129 Hypertensive chronic kidney disease with stage 1 through stage 4 chronic kidney disease, or unspecified chronic kidney disease: Secondary | ICD-10-CM | POA: Diagnosis not present

## 2012-12-01 DIAGNOSIS — R0609 Other forms of dyspnea: Secondary | ICD-10-CM | POA: Diagnosis not present

## 2012-12-02 DIAGNOSIS — N186 End stage renal disease: Secondary | ICD-10-CM | POA: Diagnosis not present

## 2012-12-02 DIAGNOSIS — D473 Essential (hemorrhagic) thrombocythemia: Secondary | ICD-10-CM | POA: Diagnosis not present

## 2012-12-02 DIAGNOSIS — N039 Chronic nephritic syndrome with unspecified morphologic changes: Secondary | ICD-10-CM | POA: Diagnosis not present

## 2012-12-05 DIAGNOSIS — D631 Anemia in chronic kidney disease: Secondary | ICD-10-CM | POA: Diagnosis not present

## 2012-12-05 DIAGNOSIS — D473 Essential (hemorrhagic) thrombocythemia: Secondary | ICD-10-CM | POA: Diagnosis not present

## 2012-12-05 DIAGNOSIS — N186 End stage renal disease: Secondary | ICD-10-CM | POA: Diagnosis not present

## 2012-12-06 DIAGNOSIS — H538 Other visual disturbances: Secondary | ICD-10-CM | POA: Diagnosis not present

## 2012-12-07 DIAGNOSIS — N186 End stage renal disease: Secondary | ICD-10-CM | POA: Diagnosis not present

## 2012-12-07 DIAGNOSIS — D473 Essential (hemorrhagic) thrombocythemia: Secondary | ICD-10-CM | POA: Diagnosis not present

## 2012-12-07 DIAGNOSIS — N039 Chronic nephritic syndrome with unspecified morphologic changes: Secondary | ICD-10-CM | POA: Diagnosis not present

## 2012-12-08 DIAGNOSIS — N186 End stage renal disease: Secondary | ICD-10-CM | POA: Diagnosis not present

## 2012-12-08 DIAGNOSIS — Z5181 Encounter for therapeutic drug level monitoring: Secondary | ICD-10-CM | POA: Diagnosis not present

## 2012-12-08 DIAGNOSIS — I780 Hereditary hemorrhagic telangiectasia: Secondary | ICD-10-CM | POA: Diagnosis not present

## 2012-12-08 DIAGNOSIS — Z79899 Other long term (current) drug therapy: Secondary | ICD-10-CM | POA: Diagnosis not present

## 2012-12-08 DIAGNOSIS — D473 Essential (hemorrhagic) thrombocythemia: Secondary | ICD-10-CM | POA: Diagnosis not present

## 2012-12-09 DIAGNOSIS — D631 Anemia in chronic kidney disease: Secondary | ICD-10-CM | POA: Diagnosis not present

## 2012-12-09 DIAGNOSIS — D473 Essential (hemorrhagic) thrombocythemia: Secondary | ICD-10-CM | POA: Diagnosis not present

## 2012-12-09 DIAGNOSIS — N186 End stage renal disease: Secondary | ICD-10-CM | POA: Diagnosis not present

## 2012-12-12 DIAGNOSIS — D509 Iron deficiency anemia, unspecified: Secondary | ICD-10-CM | POA: Diagnosis not present

## 2012-12-12 DIAGNOSIS — D473 Essential (hemorrhagic) thrombocythemia: Secondary | ICD-10-CM | POA: Diagnosis not present

## 2012-12-12 DIAGNOSIS — N039 Chronic nephritic syndrome with unspecified morphologic changes: Secondary | ICD-10-CM | POA: Diagnosis not present

## 2012-12-12 DIAGNOSIS — N186 End stage renal disease: Secondary | ICD-10-CM | POA: Diagnosis not present

## 2012-12-14 DIAGNOSIS — N186 End stage renal disease: Secondary | ICD-10-CM | POA: Diagnosis not present

## 2012-12-14 DIAGNOSIS — D509 Iron deficiency anemia, unspecified: Secondary | ICD-10-CM | POA: Diagnosis not present

## 2012-12-14 DIAGNOSIS — D631 Anemia in chronic kidney disease: Secondary | ICD-10-CM | POA: Diagnosis not present

## 2012-12-14 DIAGNOSIS — D473 Essential (hemorrhagic) thrombocythemia: Secondary | ICD-10-CM | POA: Diagnosis not present

## 2012-12-16 DIAGNOSIS — D509 Iron deficiency anemia, unspecified: Secondary | ICD-10-CM | POA: Diagnosis not present

## 2012-12-16 DIAGNOSIS — N039 Chronic nephritic syndrome with unspecified morphologic changes: Secondary | ICD-10-CM | POA: Diagnosis not present

## 2012-12-16 DIAGNOSIS — D473 Essential (hemorrhagic) thrombocythemia: Secondary | ICD-10-CM | POA: Diagnosis not present

## 2012-12-16 DIAGNOSIS — N186 End stage renal disease: Secondary | ICD-10-CM | POA: Diagnosis not present

## 2012-12-18 DIAGNOSIS — H3560 Retinal hemorrhage, unspecified eye: Secondary | ICD-10-CM | POA: Diagnosis not present

## 2012-12-18 DIAGNOSIS — H43819 Vitreous degeneration, unspecified eye: Secondary | ICD-10-CM | POA: Diagnosis not present

## 2012-12-19 DIAGNOSIS — D473 Essential (hemorrhagic) thrombocythemia: Secondary | ICD-10-CM | POA: Diagnosis not present

## 2012-12-19 DIAGNOSIS — N186 End stage renal disease: Secondary | ICD-10-CM | POA: Diagnosis not present

## 2012-12-19 DIAGNOSIS — D509 Iron deficiency anemia, unspecified: Secondary | ICD-10-CM | POA: Diagnosis not present

## 2012-12-19 DIAGNOSIS — D631 Anemia in chronic kidney disease: Secondary | ICD-10-CM | POA: Diagnosis not present

## 2012-12-20 DIAGNOSIS — D473 Essential (hemorrhagic) thrombocythemia: Secondary | ICD-10-CM | POA: Diagnosis not present

## 2012-12-20 DIAGNOSIS — Z01818 Encounter for other preprocedural examination: Secondary | ICD-10-CM | POA: Diagnosis not present

## 2012-12-20 DIAGNOSIS — N186 End stage renal disease: Secondary | ICD-10-CM | POA: Diagnosis not present

## 2012-12-21 ENCOUNTER — Ambulatory Visit: Payer: Self-pay | Admitting: Internal Medicine

## 2012-12-21 DIAGNOSIS — D631 Anemia in chronic kidney disease: Secondary | ICD-10-CM | POA: Diagnosis not present

## 2012-12-21 DIAGNOSIS — N186 End stage renal disease: Secondary | ICD-10-CM | POA: Diagnosis not present

## 2012-12-21 DIAGNOSIS — D509 Iron deficiency anemia, unspecified: Secondary | ICD-10-CM | POA: Diagnosis not present

## 2012-12-21 DIAGNOSIS — D473 Essential (hemorrhagic) thrombocythemia: Secondary | ICD-10-CM | POA: Diagnosis not present

## 2012-12-22 DIAGNOSIS — IMO0001 Reserved for inherently not codable concepts without codable children: Secondary | ICD-10-CM | POA: Diagnosis not present

## 2012-12-22 DIAGNOSIS — R262 Difficulty in walking, not elsewhere classified: Secondary | ICD-10-CM | POA: Diagnosis not present

## 2012-12-22 DIAGNOSIS — R0609 Other forms of dyspnea: Secondary | ICD-10-CM | POA: Diagnosis not present

## 2012-12-22 DIAGNOSIS — R0902 Hypoxemia: Secondary | ICD-10-CM | POA: Diagnosis not present

## 2012-12-22 DIAGNOSIS — R0989 Other specified symptoms and signs involving the circulatory and respiratory systems: Secondary | ICD-10-CM | POA: Diagnosis not present

## 2012-12-23 DIAGNOSIS — N186 End stage renal disease: Secondary | ICD-10-CM | POA: Diagnosis not present

## 2012-12-23 DIAGNOSIS — D631 Anemia in chronic kidney disease: Secondary | ICD-10-CM | POA: Diagnosis not present

## 2012-12-23 DIAGNOSIS — D473 Essential (hemorrhagic) thrombocythemia: Secondary | ICD-10-CM | POA: Diagnosis not present

## 2012-12-23 DIAGNOSIS — D509 Iron deficiency anemia, unspecified: Secondary | ICD-10-CM | POA: Diagnosis not present

## 2012-12-26 DIAGNOSIS — N186 End stage renal disease: Secondary | ICD-10-CM | POA: Diagnosis not present

## 2012-12-26 DIAGNOSIS — D509 Iron deficiency anemia, unspecified: Secondary | ICD-10-CM | POA: Diagnosis not present

## 2012-12-26 DIAGNOSIS — D473 Essential (hemorrhagic) thrombocythemia: Secondary | ICD-10-CM | POA: Diagnosis not present

## 2012-12-26 DIAGNOSIS — N039 Chronic nephritic syndrome with unspecified morphologic changes: Secondary | ICD-10-CM | POA: Diagnosis not present

## 2012-12-27 DIAGNOSIS — Z01818 Encounter for other preprocedural examination: Secondary | ICD-10-CM | POA: Diagnosis not present

## 2012-12-27 DIAGNOSIS — R9431 Abnormal electrocardiogram [ECG] [EKG]: Secondary | ICD-10-CM | POA: Diagnosis not present

## 2012-12-27 DIAGNOSIS — N186 End stage renal disease: Secondary | ICD-10-CM | POA: Diagnosis not present

## 2012-12-28 DIAGNOSIS — N186 End stage renal disease: Secondary | ICD-10-CM | POA: Diagnosis not present

## 2012-12-28 DIAGNOSIS — D473 Essential (hemorrhagic) thrombocythemia: Secondary | ICD-10-CM | POA: Diagnosis not present

## 2012-12-28 DIAGNOSIS — T82898A Other specified complication of vascular prosthetic devices, implants and grafts, initial encounter: Secondary | ICD-10-CM | POA: Diagnosis not present

## 2012-12-28 DIAGNOSIS — D509 Iron deficiency anemia, unspecified: Secondary | ICD-10-CM | POA: Diagnosis not present

## 2012-12-28 DIAGNOSIS — D631 Anemia in chronic kidney disease: Secondary | ICD-10-CM | POA: Diagnosis not present

## 2012-12-28 DIAGNOSIS — I871 Compression of vein: Secondary | ICD-10-CM | POA: Diagnosis not present

## 2012-12-29 DIAGNOSIS — R0609 Other forms of dyspnea: Secondary | ICD-10-CM | POA: Diagnosis not present

## 2012-12-29 DIAGNOSIS — IMO0001 Reserved for inherently not codable concepts without codable children: Secondary | ICD-10-CM | POA: Diagnosis not present

## 2012-12-29 DIAGNOSIS — R262 Difficulty in walking, not elsewhere classified: Secondary | ICD-10-CM | POA: Diagnosis not present

## 2012-12-29 DIAGNOSIS — R0902 Hypoxemia: Secondary | ICD-10-CM | POA: Diagnosis not present

## 2012-12-30 DIAGNOSIS — D509 Iron deficiency anemia, unspecified: Secondary | ICD-10-CM | POA: Diagnosis not present

## 2012-12-30 DIAGNOSIS — D473 Essential (hemorrhagic) thrombocythemia: Secondary | ICD-10-CM | POA: Diagnosis not present

## 2012-12-30 DIAGNOSIS — D631 Anemia in chronic kidney disease: Secondary | ICD-10-CM | POA: Diagnosis not present

## 2012-12-30 DIAGNOSIS — N186 End stage renal disease: Secondary | ICD-10-CM | POA: Diagnosis not present

## 2013-01-02 DIAGNOSIS — D473 Essential (hemorrhagic) thrombocythemia: Secondary | ICD-10-CM | POA: Diagnosis not present

## 2013-01-02 DIAGNOSIS — N186 End stage renal disease: Secondary | ICD-10-CM | POA: Diagnosis not present

## 2013-01-02 DIAGNOSIS — D509 Iron deficiency anemia, unspecified: Secondary | ICD-10-CM | POA: Diagnosis not present

## 2013-01-02 DIAGNOSIS — N039 Chronic nephritic syndrome with unspecified morphologic changes: Secondary | ICD-10-CM | POA: Diagnosis not present

## 2013-01-04 DIAGNOSIS — N186 End stage renal disease: Secondary | ICD-10-CM | POA: Diagnosis not present

## 2013-01-04 DIAGNOSIS — D473 Essential (hemorrhagic) thrombocythemia: Secondary | ICD-10-CM | POA: Diagnosis not present

## 2013-01-04 DIAGNOSIS — D509 Iron deficiency anemia, unspecified: Secondary | ICD-10-CM | POA: Diagnosis not present

## 2013-01-04 DIAGNOSIS — D631 Anemia in chronic kidney disease: Secondary | ICD-10-CM | POA: Diagnosis not present

## 2013-01-05 ENCOUNTER — Ambulatory Visit (INDEPENDENT_AMBULATORY_CARE_PROVIDER_SITE_OTHER): Payer: Medicare Other | Admitting: Internal Medicine

## 2013-01-05 ENCOUNTER — Encounter: Payer: Self-pay | Admitting: Internal Medicine

## 2013-01-05 VITALS — BP 118/50 | HR 84 | Temp 97.8°F | Ht 62.5 in | Wt 181.5 lb

## 2013-01-05 DIAGNOSIS — Q2730 Arteriovenous malformation, site unspecified: Secondary | ICD-10-CM

## 2013-01-05 DIAGNOSIS — J811 Chronic pulmonary edema: Secondary | ICD-10-CM | POA: Diagnosis not present

## 2013-01-05 DIAGNOSIS — G4733 Obstructive sleep apnea (adult) (pediatric): Secondary | ICD-10-CM

## 2013-01-05 DIAGNOSIS — D473 Essential (hemorrhagic) thrombocythemia: Secondary | ICD-10-CM | POA: Diagnosis not present

## 2013-01-05 DIAGNOSIS — M109 Gout, unspecified: Secondary | ICD-10-CM

## 2013-01-05 DIAGNOSIS — D631 Anemia in chronic kidney disease: Secondary | ICD-10-CM

## 2013-01-05 DIAGNOSIS — Q279 Congenital malformation of peripheral vascular system, unspecified: Secondary | ICD-10-CM

## 2013-01-05 DIAGNOSIS — N189 Chronic kidney disease, unspecified: Secondary | ICD-10-CM

## 2013-01-05 DIAGNOSIS — N039 Chronic nephritic syndrome with unspecified morphologic changes: Secondary | ICD-10-CM | POA: Diagnosis not present

## 2013-01-06 DIAGNOSIS — D509 Iron deficiency anemia, unspecified: Secondary | ICD-10-CM | POA: Diagnosis not present

## 2013-01-06 DIAGNOSIS — D473 Essential (hemorrhagic) thrombocythemia: Secondary | ICD-10-CM | POA: Diagnosis not present

## 2013-01-06 DIAGNOSIS — D631 Anemia in chronic kidney disease: Secondary | ICD-10-CM | POA: Diagnosis not present

## 2013-01-06 DIAGNOSIS — N186 End stage renal disease: Secondary | ICD-10-CM | POA: Diagnosis not present

## 2013-01-07 ENCOUNTER — Encounter: Payer: Self-pay | Admitting: Internal Medicine

## 2013-01-07 DIAGNOSIS — M109 Gout, unspecified: Secondary | ICD-10-CM | POA: Insufficient documentation

## 2013-01-07 DIAGNOSIS — J811 Chronic pulmonary edema: Secondary | ICD-10-CM | POA: Insufficient documentation

## 2013-01-07 DIAGNOSIS — N189 Chronic kidney disease, unspecified: Secondary | ICD-10-CM | POA: Insufficient documentation

## 2013-01-07 DIAGNOSIS — D473 Essential (hemorrhagic) thrombocythemia: Secondary | ICD-10-CM | POA: Insufficient documentation

## 2013-01-07 DIAGNOSIS — Q2730 Arteriovenous malformation, site unspecified: Secondary | ICD-10-CM | POA: Insufficient documentation

## 2013-01-07 DIAGNOSIS — D631 Anemia in chronic kidney disease: Secondary | ICD-10-CM | POA: Insufficient documentation

## 2013-01-07 DIAGNOSIS — G4733 Obstructive sleep apnea (adult) (pediatric): Secondary | ICD-10-CM | POA: Insufficient documentation

## 2013-01-07 NOTE — Assessment & Plan Note (Signed)
No recent attacks On allopurinol 

## 2013-01-07 NOTE — Assessment & Plan Note (Signed)
Currently receiving hemodialysis.  Planning to have a PD catheter placed 01/08/13.  Followed by Dr Raechel Ache.  Obtain records.

## 2013-01-07 NOTE — Assessment & Plan Note (Signed)
Found in the small bowel.  Stable.  Last hgb 12.  Follow.

## 2013-01-07 NOTE — Assessment & Plan Note (Signed)
Last hemoglobin check 12.  Follow.

## 2013-01-07 NOTE — Assessment & Plan Note (Signed)
Breathing better.  Followed by Dr Jeanne Ivan.  Basically off oxygen.  Exercising.

## 2013-01-07 NOTE — Assessment & Plan Note (Signed)
Followed by Dr Ma Hillock.  He is following counts.

## 2013-01-07 NOTE — Assessment & Plan Note (Signed)
Using CPAP.  Stable.   

## 2013-01-07 NOTE — Progress Notes (Signed)
Subjective:    Patient ID: Diane Guerra, female    DOB: 1950-09-15, 62 y.o.   MRN: [id number]  HPI 61 year old female with past history of chronic kidney disease (with recent acute renal failure) on hemodialysis now.  Planning to have a PD cath placed 01/08/13 by Dr Waunita Schooner.  She also is followed by Dr Jeanne Ivan for her pulmonary edema.  Doing better now from a pulmonary standpoint.  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  She comes in today to follow up on these issues as well as to establish care.  Previously was being followed by Dr Hall Busing.  States Dr Ma Hillock sees her every six months.  Counts have been stable.  She was admitted 3/14 and found to have pulmonary edema.  Was diuresed.  Breathing better now.  Not really using oxygen now.  She is exercising.  Riding her bike.  Oxygen saturation stays around 94%.  She is currently being dialyzed every Tuesday, Thursday and Saturday.  Planning to have a PD catheter placed 01/08/13.  Is on the transplant list.  Being followed closely.  She also has sleep apnea and utilizes CPAP.  Stable.  Overall she appears to be doing better and feels things are stable.     Past Medical History  Diagnosis Date  . Sleep apnea with use of continuous positive airway pressure (CPAP)   . Gout   . Anemia   . Chronic kidney disease 2007    followed by Dr Juanito Doom  . Pulmonary edema 2014  . Kidney failure 2014  . Essential thrombocytosis     Outpatient Encounter Prescriptions as of 01/05/2013  Medication Sig Dispense Refill  . allopurinol (ZYLOPRIM) 100 MG tablet Take 100 mg by mouth daily. Take 2 every M,W,F & 1 all other days      . anagrelide (AGRYLIN) 1 MG capsule Take 1 mg by mouth 2 (two) times daily.      . calcium carbonate (TUMS EX) 750 MG chewable tablet Chew 1 tablet by mouth daily.      . hydroxyurea (HYDREA) 500 MG capsule Take 500 mg by mouth 2 (two) times daily. May take with food to minimize GI side effects.      . Multiple  Vitamins-Minerals (RENAL) TABS Take by mouth.      . predniSONE (DELTASONE) 10 MG tablet Take 10 mg by mouth as needed.      . Probiotic Product (PROBIOTIC DAILY PO) Take by mouth.      . Vitamin D, Ergocalciferol, (DRISDOL) 50000 UNITS CAPS Take 50,000 Units by mouth every 30 (thirty) days.      Marland Kitchen zolpidem (AMBIEN) 5 MG tablet Take 5 mg by mouth at bedtime as needed for sleep.       No facility-administered encounter medications on file as of 01/05/2013.    Review of Systems Patient denies any headache, lightheadedness or dizziness.  No significant sinus or allergy symptoms.  No chest pain, tightness or palpitations.  No increased shortness of breath, cough or congestion. Breathing much improved.  No nausea or vomiting. No acid reflux.  No abdominal pain or cramping. No bowel change, such as diarrhea, constipation, BRBPR or melana.  No urine change.        Objective:   Physical Exam Filed Vitals:   01/05/13 1004  BP: 118/50  Pulse: 84  Temp: 97.8 F (36.6 C)   Blood pressure recheck:  118/58, pulse 71  62 year old female in no acute  distress.   HEENT:  Nares- clear.  Oropharynx - without lesions. NECK:  Supple.  Nontender.  No audible bruit.  HEART:  Appears to be regular. LUNGS:  No crackles or wheezing audible.  Respirations even and unlabored.  RADIAL PULSE:  Equal bilaterally. ABDOMEN:  Soft, nontender.  Bowel sounds present and normal.  No audible abdominal bruit.   EXTREMITIES:  No increased edema present.  DP pulses palpable and equal bilaterally.          Assessment & Plan:  HEALTH MAINTENANCE.  Schedule her for a physical when due.  Obtain records to review.  States up to date with her physicals.  Colonoscopy four years ago.  Ok.   I spent 45 minutes with the patient and her husband and more than 50% of the time was spent in consultation regarding the above.

## 2013-01-08 DIAGNOSIS — Z992 Dependence on renal dialysis: Secondary | ICD-10-CM | POA: Diagnosis not present

## 2013-01-08 DIAGNOSIS — G4730 Sleep apnea, unspecified: Secondary | ICD-10-CM | POA: Diagnosis not present

## 2013-01-08 DIAGNOSIS — N186 End stage renal disease: Secondary | ICD-10-CM | POA: Diagnosis not present

## 2013-01-08 DIAGNOSIS — M109 Gout, unspecified: Secondary | ICD-10-CM | POA: Diagnosis not present

## 2013-01-08 DIAGNOSIS — Z79899 Other long term (current) drug therapy: Secondary | ICD-10-CM | POA: Diagnosis not present

## 2013-01-08 DIAGNOSIS — Z841 Family history of disorders of kidney and ureter: Secondary | ICD-10-CM | POA: Diagnosis not present

## 2013-01-09 DIAGNOSIS — D631 Anemia in chronic kidney disease: Secondary | ICD-10-CM | POA: Diagnosis not present

## 2013-01-09 DIAGNOSIS — Z23 Encounter for immunization: Secondary | ICD-10-CM | POA: Diagnosis not present

## 2013-01-09 DIAGNOSIS — M109 Gout, unspecified: Secondary | ICD-10-CM | POA: Diagnosis not present

## 2013-01-09 DIAGNOSIS — D473 Essential (hemorrhagic) thrombocythemia: Secondary | ICD-10-CM | POA: Diagnosis not present

## 2013-01-09 DIAGNOSIS — Z841 Family history of disorders of kidney and ureter: Secondary | ICD-10-CM | POA: Diagnosis not present

## 2013-01-09 DIAGNOSIS — G4730 Sleep apnea, unspecified: Secondary | ICD-10-CM | POA: Diagnosis not present

## 2013-01-09 DIAGNOSIS — Z992 Dependence on renal dialysis: Secondary | ICD-10-CM | POA: Diagnosis not present

## 2013-01-09 DIAGNOSIS — N186 End stage renal disease: Secondary | ICD-10-CM | POA: Diagnosis not present

## 2013-01-09 DIAGNOSIS — Z79899 Other long term (current) drug therapy: Secondary | ICD-10-CM | POA: Diagnosis not present

## 2013-01-11 DIAGNOSIS — D473 Essential (hemorrhagic) thrombocythemia: Secondary | ICD-10-CM | POA: Diagnosis not present

## 2013-01-11 DIAGNOSIS — Z23 Encounter for immunization: Secondary | ICD-10-CM | POA: Diagnosis not present

## 2013-01-11 DIAGNOSIS — M109 Gout, unspecified: Secondary | ICD-10-CM | POA: Diagnosis not present

## 2013-01-11 DIAGNOSIS — D631 Anemia in chronic kidney disease: Secondary | ICD-10-CM | POA: Diagnosis not present

## 2013-01-11 DIAGNOSIS — N186 End stage renal disease: Secondary | ICD-10-CM | POA: Diagnosis not present

## 2013-01-13 DIAGNOSIS — D631 Anemia in chronic kidney disease: Secondary | ICD-10-CM | POA: Diagnosis not present

## 2013-01-13 DIAGNOSIS — M109 Gout, unspecified: Secondary | ICD-10-CM | POA: Diagnosis not present

## 2013-01-13 DIAGNOSIS — D473 Essential (hemorrhagic) thrombocythemia: Secondary | ICD-10-CM | POA: Diagnosis not present

## 2013-01-13 DIAGNOSIS — Z23 Encounter for immunization: Secondary | ICD-10-CM | POA: Diagnosis not present

## 2013-01-13 DIAGNOSIS — N186 End stage renal disease: Secondary | ICD-10-CM | POA: Diagnosis not present

## 2013-01-16 DIAGNOSIS — N186 End stage renal disease: Secondary | ICD-10-CM | POA: Diagnosis not present

## 2013-01-16 DIAGNOSIS — D473 Essential (hemorrhagic) thrombocythemia: Secondary | ICD-10-CM | POA: Diagnosis not present

## 2013-01-16 DIAGNOSIS — Z23 Encounter for immunization: Secondary | ICD-10-CM | POA: Diagnosis not present

## 2013-01-16 DIAGNOSIS — N039 Chronic nephritic syndrome with unspecified morphologic changes: Secondary | ICD-10-CM | POA: Diagnosis not present

## 2013-01-16 DIAGNOSIS — M109 Gout, unspecified: Secondary | ICD-10-CM | POA: Diagnosis not present

## 2013-01-18 DIAGNOSIS — M109 Gout, unspecified: Secondary | ICD-10-CM | POA: Diagnosis not present

## 2013-01-18 DIAGNOSIS — Z23 Encounter for immunization: Secondary | ICD-10-CM | POA: Diagnosis not present

## 2013-01-18 DIAGNOSIS — D473 Essential (hemorrhagic) thrombocythemia: Secondary | ICD-10-CM | POA: Diagnosis not present

## 2013-01-18 DIAGNOSIS — N186 End stage renal disease: Secondary | ICD-10-CM | POA: Diagnosis not present

## 2013-01-18 DIAGNOSIS — D631 Anemia in chronic kidney disease: Secondary | ICD-10-CM | POA: Diagnosis not present

## 2013-01-19 DIAGNOSIS — I5030 Unspecified diastolic (congestive) heart failure: Secondary | ICD-10-CM | POA: Diagnosis not present

## 2013-01-19 DIAGNOSIS — I509 Heart failure, unspecified: Secondary | ICD-10-CM | POA: Diagnosis not present

## 2013-01-19 DIAGNOSIS — I120 Hypertensive chronic kidney disease with stage 5 chronic kidney disease or end stage renal disease: Secondary | ICD-10-CM | POA: Diagnosis not present

## 2013-01-19 DIAGNOSIS — R0902 Hypoxemia: Secondary | ICD-10-CM | POA: Diagnosis not present

## 2013-01-19 DIAGNOSIS — G4733 Obstructive sleep apnea (adult) (pediatric): Secondary | ICD-10-CM | POA: Diagnosis not present

## 2013-01-19 DIAGNOSIS — N186 End stage renal disease: Secondary | ICD-10-CM | POA: Diagnosis not present

## 2013-01-20 DIAGNOSIS — N186 End stage renal disease: Secondary | ICD-10-CM | POA: Diagnosis not present

## 2013-01-20 DIAGNOSIS — Z23 Encounter for immunization: Secondary | ICD-10-CM | POA: Diagnosis not present

## 2013-01-20 DIAGNOSIS — D473 Essential (hemorrhagic) thrombocythemia: Secondary | ICD-10-CM | POA: Diagnosis not present

## 2013-01-20 DIAGNOSIS — D631 Anemia in chronic kidney disease: Secondary | ICD-10-CM | POA: Diagnosis not present

## 2013-01-20 DIAGNOSIS — M109 Gout, unspecified: Secondary | ICD-10-CM | POA: Diagnosis not present

## 2013-01-23 DIAGNOSIS — N186 End stage renal disease: Secondary | ICD-10-CM | POA: Diagnosis not present

## 2013-01-23 DIAGNOSIS — N039 Chronic nephritic syndrome with unspecified morphologic changes: Secondary | ICD-10-CM | POA: Diagnosis not present

## 2013-01-25 DIAGNOSIS — D631 Anemia in chronic kidney disease: Secondary | ICD-10-CM | POA: Diagnosis not present

## 2013-01-25 DIAGNOSIS — N186 End stage renal disease: Secondary | ICD-10-CM | POA: Diagnosis not present

## 2013-01-27 DIAGNOSIS — N039 Chronic nephritic syndrome with unspecified morphologic changes: Secondary | ICD-10-CM | POA: Diagnosis not present

## 2013-01-27 DIAGNOSIS — N186 End stage renal disease: Secondary | ICD-10-CM | POA: Diagnosis not present

## 2013-01-30 DIAGNOSIS — N039 Chronic nephritic syndrome with unspecified morphologic changes: Secondary | ICD-10-CM | POA: Diagnosis not present

## 2013-01-30 DIAGNOSIS — Z23 Encounter for immunization: Secondary | ICD-10-CM | POA: Diagnosis not present

## 2013-01-30 DIAGNOSIS — D473 Essential (hemorrhagic) thrombocythemia: Secondary | ICD-10-CM | POA: Diagnosis not present

## 2013-01-30 DIAGNOSIS — M109 Gout, unspecified: Secondary | ICD-10-CM | POA: Diagnosis not present

## 2013-01-30 DIAGNOSIS — N186 End stage renal disease: Secondary | ICD-10-CM | POA: Diagnosis not present

## 2013-02-01 DIAGNOSIS — M109 Gout, unspecified: Secondary | ICD-10-CM | POA: Diagnosis not present

## 2013-02-01 DIAGNOSIS — N039 Chronic nephritic syndrome with unspecified morphologic changes: Secondary | ICD-10-CM | POA: Diagnosis not present

## 2013-02-01 DIAGNOSIS — D473 Essential (hemorrhagic) thrombocythemia: Secondary | ICD-10-CM | POA: Diagnosis not present

## 2013-02-01 DIAGNOSIS — Z23 Encounter for immunization: Secondary | ICD-10-CM | POA: Diagnosis not present

## 2013-02-01 DIAGNOSIS — N186 End stage renal disease: Secondary | ICD-10-CM | POA: Diagnosis not present

## 2013-02-03 DIAGNOSIS — D473 Essential (hemorrhagic) thrombocythemia: Secondary | ICD-10-CM | POA: Diagnosis not present

## 2013-02-03 DIAGNOSIS — N186 End stage renal disease: Secondary | ICD-10-CM | POA: Diagnosis not present

## 2013-02-03 DIAGNOSIS — M109 Gout, unspecified: Secondary | ICD-10-CM | POA: Diagnosis not present

## 2013-02-03 DIAGNOSIS — Z23 Encounter for immunization: Secondary | ICD-10-CM | POA: Diagnosis not present

## 2013-02-03 DIAGNOSIS — N039 Chronic nephritic syndrome with unspecified morphologic changes: Secondary | ICD-10-CM | POA: Diagnosis not present

## 2013-02-05 DIAGNOSIS — N186 End stage renal disease: Secondary | ICD-10-CM | POA: Diagnosis not present

## 2013-02-05 DIAGNOSIS — D473 Essential (hemorrhagic) thrombocythemia: Secondary | ICD-10-CM | POA: Diagnosis not present

## 2013-02-05 DIAGNOSIS — D631 Anemia in chronic kidney disease: Secondary | ICD-10-CM | POA: Diagnosis not present

## 2013-02-06 ENCOUNTER — Other Ambulatory Visit: Payer: Self-pay | Admitting: Nephrology

## 2013-02-06 DIAGNOSIS — D473 Essential (hemorrhagic) thrombocythemia: Secondary | ICD-10-CM | POA: Diagnosis not present

## 2013-02-06 DIAGNOSIS — N186 End stage renal disease: Secondary | ICD-10-CM | POA: Diagnosis not present

## 2013-02-06 DIAGNOSIS — D631 Anemia in chronic kidney disease: Secondary | ICD-10-CM | POA: Diagnosis not present

## 2013-02-06 LAB — CBC
HCT: 33.2 % — ABNORMAL LOW (ref 35.0–47.0)
MCH: 36.6 pg — ABNORMAL HIGH (ref 26.0–34.0)
MCHC: 33.9 g/dL (ref 32.0–36.0)
MCV: 108 fL — ABNORMAL HIGH (ref 80–100)
Platelet: 460 10*3/uL — ABNORMAL HIGH (ref 150–440)
RBC: 3.07 10*6/uL — ABNORMAL LOW (ref 3.80–5.20)
RDW: 20.2 % — ABNORMAL HIGH (ref 11.5–14.5)
WBC: 6.3 10*3/uL (ref 3.6–11.0)

## 2013-02-07 DIAGNOSIS — N039 Chronic nephritic syndrome with unspecified morphologic changes: Secondary | ICD-10-CM | POA: Diagnosis not present

## 2013-02-07 DIAGNOSIS — D473 Essential (hemorrhagic) thrombocythemia: Secondary | ICD-10-CM | POA: Diagnosis not present

## 2013-02-07 DIAGNOSIS — N186 End stage renal disease: Secondary | ICD-10-CM | POA: Diagnosis not present

## 2013-02-08 DIAGNOSIS — N186 End stage renal disease: Secondary | ICD-10-CM | POA: Diagnosis not present

## 2013-02-08 DIAGNOSIS — D473 Essential (hemorrhagic) thrombocythemia: Secondary | ICD-10-CM | POA: Diagnosis not present

## 2013-02-08 DIAGNOSIS — D631 Anemia in chronic kidney disease: Secondary | ICD-10-CM | POA: Diagnosis not present

## 2013-02-09 DIAGNOSIS — D509 Iron deficiency anemia, unspecified: Secondary | ICD-10-CM | POA: Diagnosis not present

## 2013-02-09 DIAGNOSIS — D473 Essential (hemorrhagic) thrombocythemia: Secondary | ICD-10-CM | POA: Diagnosis not present

## 2013-02-09 DIAGNOSIS — N186 End stage renal disease: Secondary | ICD-10-CM | POA: Diagnosis not present

## 2013-02-09 DIAGNOSIS — Z452 Encounter for adjustment and management of vascular access device: Secondary | ICD-10-CM | POA: Diagnosis not present

## 2013-02-09 DIAGNOSIS — Z23 Encounter for immunization: Secondary | ICD-10-CM | POA: Diagnosis not present

## 2013-02-09 DIAGNOSIS — D631 Anemia in chronic kidney disease: Secondary | ICD-10-CM | POA: Diagnosis not present

## 2013-02-09 DIAGNOSIS — N039 Chronic nephritic syndrome with unspecified morphologic changes: Secondary | ICD-10-CM | POA: Diagnosis not present

## 2013-02-12 ENCOUNTER — Other Ambulatory Visit: Payer: Self-pay | Admitting: Nephrology

## 2013-02-12 DIAGNOSIS — N186 End stage renal disease: Secondary | ICD-10-CM | POA: Diagnosis not present

## 2013-02-12 DIAGNOSIS — D473 Essential (hemorrhagic) thrombocythemia: Secondary | ICD-10-CM | POA: Diagnosis not present

## 2013-02-12 DIAGNOSIS — Z23 Encounter for immunization: Secondary | ICD-10-CM | POA: Diagnosis not present

## 2013-02-12 DIAGNOSIS — N039 Chronic nephritic syndrome with unspecified morphologic changes: Secondary | ICD-10-CM | POA: Diagnosis not present

## 2013-02-12 DIAGNOSIS — D509 Iron deficiency anemia, unspecified: Secondary | ICD-10-CM | POA: Diagnosis not present

## 2013-02-12 LAB — CBC WITH DIFFERENTIAL/PLATELET
Basophil %: 0.7 %
Eosinophil #: 0.1 10*3/uL (ref 0.0–0.7)
HCT: 36.3 % (ref 35.0–47.0)
HGB: 12.3 g/dL (ref 12.0–16.0)
Lymphocyte %: 14.9 %
MCHC: 33.9 g/dL (ref 32.0–36.0)
MCV: 107 fL — ABNORMAL HIGH (ref 80–100)
Monocyte #: 0.5 x10 3/mm (ref 0.2–0.9)
Neutrophil #: 4.2 10*3/uL (ref 1.4–6.5)
Neutrophil %: 74.8 %
RBC: 3.4 10*6/uL — ABNORMAL LOW (ref 3.80–5.20)
RDW: 21.7 % — ABNORMAL HIGH (ref 11.5–14.5)
WBC: 5.6 10*3/uL (ref 3.6–11.0)

## 2013-02-13 DIAGNOSIS — N039 Chronic nephritic syndrome with unspecified morphologic changes: Secondary | ICD-10-CM | POA: Diagnosis not present

## 2013-02-13 DIAGNOSIS — N186 End stage renal disease: Secondary | ICD-10-CM | POA: Diagnosis not present

## 2013-02-13 DIAGNOSIS — D473 Essential (hemorrhagic) thrombocythemia: Secondary | ICD-10-CM | POA: Diagnosis not present

## 2013-02-13 DIAGNOSIS — D631 Anemia in chronic kidney disease: Secondary | ICD-10-CM | POA: Diagnosis not present

## 2013-02-14 DIAGNOSIS — N186 End stage renal disease: Secondary | ICD-10-CM | POA: Diagnosis not present

## 2013-02-14 DIAGNOSIS — D473 Essential (hemorrhagic) thrombocythemia: Secondary | ICD-10-CM | POA: Diagnosis not present

## 2013-02-14 DIAGNOSIS — D631 Anemia in chronic kidney disease: Secondary | ICD-10-CM | POA: Diagnosis not present

## 2013-02-15 DIAGNOSIS — N039 Chronic nephritic syndrome with unspecified morphologic changes: Secondary | ICD-10-CM | POA: Diagnosis not present

## 2013-02-15 DIAGNOSIS — N186 End stage renal disease: Secondary | ICD-10-CM | POA: Diagnosis not present

## 2013-02-15 DIAGNOSIS — D473 Essential (hemorrhagic) thrombocythemia: Secondary | ICD-10-CM | POA: Diagnosis not present

## 2013-02-16 DIAGNOSIS — D631 Anemia in chronic kidney disease: Secondary | ICD-10-CM | POA: Diagnosis not present

## 2013-02-16 DIAGNOSIS — D473 Essential (hemorrhagic) thrombocythemia: Secondary | ICD-10-CM | POA: Diagnosis not present

## 2013-02-16 DIAGNOSIS — N186 End stage renal disease: Secondary | ICD-10-CM | POA: Diagnosis not present

## 2013-02-17 DIAGNOSIS — D473 Essential (hemorrhagic) thrombocythemia: Secondary | ICD-10-CM | POA: Diagnosis not present

## 2013-02-17 DIAGNOSIS — D631 Anemia in chronic kidney disease: Secondary | ICD-10-CM | POA: Diagnosis not present

## 2013-02-17 DIAGNOSIS — N186 End stage renal disease: Secondary | ICD-10-CM | POA: Diagnosis not present

## 2013-02-18 DIAGNOSIS — N186 End stage renal disease: Secondary | ICD-10-CM | POA: Diagnosis not present

## 2013-02-18 DIAGNOSIS — D631 Anemia in chronic kidney disease: Secondary | ICD-10-CM | POA: Diagnosis not present

## 2013-02-18 DIAGNOSIS — D473 Essential (hemorrhagic) thrombocythemia: Secondary | ICD-10-CM | POA: Diagnosis not present

## 2013-02-19 DIAGNOSIS — D473 Essential (hemorrhagic) thrombocythemia: Secondary | ICD-10-CM | POA: Diagnosis not present

## 2013-02-19 DIAGNOSIS — N186 End stage renal disease: Secondary | ICD-10-CM | POA: Diagnosis not present

## 2013-02-19 DIAGNOSIS — D631 Anemia in chronic kidney disease: Secondary | ICD-10-CM | POA: Diagnosis not present

## 2013-02-20 DIAGNOSIS — D473 Essential (hemorrhagic) thrombocythemia: Secondary | ICD-10-CM | POA: Diagnosis not present

## 2013-02-20 DIAGNOSIS — N186 End stage renal disease: Secondary | ICD-10-CM | POA: Diagnosis not present

## 2013-02-20 DIAGNOSIS — D631 Anemia in chronic kidney disease: Secondary | ICD-10-CM | POA: Diagnosis not present

## 2013-02-21 DIAGNOSIS — N186 End stage renal disease: Secondary | ICD-10-CM | POA: Diagnosis not present

## 2013-02-21 DIAGNOSIS — D473 Essential (hemorrhagic) thrombocythemia: Secondary | ICD-10-CM | POA: Diagnosis not present

## 2013-02-21 DIAGNOSIS — D631 Anemia in chronic kidney disease: Secondary | ICD-10-CM | POA: Diagnosis not present

## 2013-02-22 DIAGNOSIS — N039 Chronic nephritic syndrome with unspecified morphologic changes: Secondary | ICD-10-CM | POA: Diagnosis not present

## 2013-02-22 DIAGNOSIS — N186 End stage renal disease: Secondary | ICD-10-CM | POA: Diagnosis not present

## 2013-02-22 DIAGNOSIS — D473 Essential (hemorrhagic) thrombocythemia: Secondary | ICD-10-CM | POA: Diagnosis not present

## 2013-02-23 ENCOUNTER — Other Ambulatory Visit: Payer: Self-pay | Admitting: Nephrology

## 2013-02-23 DIAGNOSIS — N186 End stage renal disease: Secondary | ICD-10-CM | POA: Diagnosis not present

## 2013-02-23 DIAGNOSIS — D473 Essential (hemorrhagic) thrombocythemia: Secondary | ICD-10-CM | POA: Diagnosis not present

## 2013-02-23 DIAGNOSIS — N039 Chronic nephritic syndrome with unspecified morphologic changes: Secondary | ICD-10-CM | POA: Diagnosis not present

## 2013-02-24 DIAGNOSIS — D631 Anemia in chronic kidney disease: Secondary | ICD-10-CM | POA: Diagnosis not present

## 2013-02-24 DIAGNOSIS — D473 Essential (hemorrhagic) thrombocythemia: Secondary | ICD-10-CM | POA: Diagnosis not present

## 2013-02-24 DIAGNOSIS — N186 End stage renal disease: Secondary | ICD-10-CM | POA: Diagnosis not present

## 2013-02-25 DIAGNOSIS — N039 Chronic nephritic syndrome with unspecified morphologic changes: Secondary | ICD-10-CM | POA: Diagnosis not present

## 2013-02-25 DIAGNOSIS — D473 Essential (hemorrhagic) thrombocythemia: Secondary | ICD-10-CM | POA: Diagnosis not present

## 2013-02-25 DIAGNOSIS — N186 End stage renal disease: Secondary | ICD-10-CM | POA: Diagnosis not present

## 2013-02-26 DIAGNOSIS — D631 Anemia in chronic kidney disease: Secondary | ICD-10-CM | POA: Diagnosis not present

## 2013-02-26 DIAGNOSIS — D473 Essential (hemorrhagic) thrombocythemia: Secondary | ICD-10-CM | POA: Diagnosis not present

## 2013-02-26 DIAGNOSIS — N186 End stage renal disease: Secondary | ICD-10-CM | POA: Diagnosis not present

## 2013-02-27 DIAGNOSIS — D631 Anemia in chronic kidney disease: Secondary | ICD-10-CM | POA: Diagnosis not present

## 2013-02-27 DIAGNOSIS — N186 End stage renal disease: Secondary | ICD-10-CM | POA: Diagnosis not present

## 2013-02-27 DIAGNOSIS — D473 Essential (hemorrhagic) thrombocythemia: Secondary | ICD-10-CM | POA: Diagnosis not present

## 2013-02-28 DIAGNOSIS — D473 Essential (hemorrhagic) thrombocythemia: Secondary | ICD-10-CM | POA: Diagnosis not present

## 2013-02-28 DIAGNOSIS — N186 End stage renal disease: Secondary | ICD-10-CM | POA: Diagnosis not present

## 2013-02-28 DIAGNOSIS — D631 Anemia in chronic kidney disease: Secondary | ICD-10-CM | POA: Diagnosis not present

## 2013-03-01 DIAGNOSIS — N186 End stage renal disease: Secondary | ICD-10-CM | POA: Diagnosis not present

## 2013-03-01 DIAGNOSIS — D473 Essential (hemorrhagic) thrombocythemia: Secondary | ICD-10-CM | POA: Diagnosis not present

## 2013-03-01 DIAGNOSIS — D631 Anemia in chronic kidney disease: Secondary | ICD-10-CM | POA: Diagnosis not present

## 2013-03-02 DIAGNOSIS — N186 End stage renal disease: Secondary | ICD-10-CM | POA: Diagnosis not present

## 2013-03-02 DIAGNOSIS — D631 Anemia in chronic kidney disease: Secondary | ICD-10-CM | POA: Diagnosis not present

## 2013-03-02 DIAGNOSIS — D473 Essential (hemorrhagic) thrombocythemia: Secondary | ICD-10-CM | POA: Diagnosis not present

## 2013-03-03 DIAGNOSIS — D473 Essential (hemorrhagic) thrombocythemia: Secondary | ICD-10-CM | POA: Diagnosis not present

## 2013-03-03 DIAGNOSIS — D631 Anemia in chronic kidney disease: Secondary | ICD-10-CM | POA: Diagnosis not present

## 2013-03-03 DIAGNOSIS — N186 End stage renal disease: Secondary | ICD-10-CM | POA: Diagnosis not present

## 2013-03-04 DIAGNOSIS — D631 Anemia in chronic kidney disease: Secondary | ICD-10-CM | POA: Diagnosis not present

## 2013-03-04 DIAGNOSIS — N186 End stage renal disease: Secondary | ICD-10-CM | POA: Diagnosis not present

## 2013-03-04 DIAGNOSIS — D473 Essential (hemorrhagic) thrombocythemia: Secondary | ICD-10-CM | POA: Diagnosis not present

## 2013-03-05 DIAGNOSIS — D473 Essential (hemorrhagic) thrombocythemia: Secondary | ICD-10-CM | POA: Diagnosis not present

## 2013-03-05 DIAGNOSIS — R7309 Other abnormal glucose: Secondary | ICD-10-CM | POA: Diagnosis not present

## 2013-03-05 DIAGNOSIS — N186 End stage renal disease: Secondary | ICD-10-CM | POA: Diagnosis not present

## 2013-03-05 DIAGNOSIS — D631 Anemia in chronic kidney disease: Secondary | ICD-10-CM | POA: Diagnosis not present

## 2013-03-06 DIAGNOSIS — N186 End stage renal disease: Secondary | ICD-10-CM | POA: Diagnosis not present

## 2013-03-06 DIAGNOSIS — D473 Essential (hemorrhagic) thrombocythemia: Secondary | ICD-10-CM | POA: Diagnosis not present

## 2013-03-06 DIAGNOSIS — D631 Anemia in chronic kidney disease: Secondary | ICD-10-CM | POA: Diagnosis not present

## 2013-03-07 DIAGNOSIS — D631 Anemia in chronic kidney disease: Secondary | ICD-10-CM | POA: Diagnosis not present

## 2013-03-07 DIAGNOSIS — N186 End stage renal disease: Secondary | ICD-10-CM | POA: Diagnosis not present

## 2013-03-07 DIAGNOSIS — D473 Essential (hemorrhagic) thrombocythemia: Secondary | ICD-10-CM | POA: Diagnosis not present

## 2013-03-08 DIAGNOSIS — N186 End stage renal disease: Secondary | ICD-10-CM | POA: Diagnosis not present

## 2013-03-08 DIAGNOSIS — D473 Essential (hemorrhagic) thrombocythemia: Secondary | ICD-10-CM | POA: Diagnosis not present

## 2013-03-08 DIAGNOSIS — D631 Anemia in chronic kidney disease: Secondary | ICD-10-CM | POA: Diagnosis not present

## 2013-03-09 DIAGNOSIS — D631 Anemia in chronic kidney disease: Secondary | ICD-10-CM | POA: Diagnosis not present

## 2013-03-09 DIAGNOSIS — D473 Essential (hemorrhagic) thrombocythemia: Secondary | ICD-10-CM | POA: Diagnosis not present

## 2013-03-09 DIAGNOSIS — N186 End stage renal disease: Secondary | ICD-10-CM | POA: Diagnosis not present

## 2013-03-10 DIAGNOSIS — N186 End stage renal disease: Secondary | ICD-10-CM | POA: Diagnosis not present

## 2013-03-10 DIAGNOSIS — D473 Essential (hemorrhagic) thrombocythemia: Secondary | ICD-10-CM | POA: Diagnosis not present

## 2013-03-10 DIAGNOSIS — D631 Anemia in chronic kidney disease: Secondary | ICD-10-CM | POA: Diagnosis not present

## 2013-03-11 DIAGNOSIS — D631 Anemia in chronic kidney disease: Secondary | ICD-10-CM | POA: Diagnosis not present

## 2013-03-11 DIAGNOSIS — D473 Essential (hemorrhagic) thrombocythemia: Secondary | ICD-10-CM | POA: Diagnosis not present

## 2013-03-11 DIAGNOSIS — N186 End stage renal disease: Secondary | ICD-10-CM | POA: Diagnosis not present

## 2013-03-12 DIAGNOSIS — D473 Essential (hemorrhagic) thrombocythemia: Secondary | ICD-10-CM | POA: Diagnosis not present

## 2013-03-12 DIAGNOSIS — N186 End stage renal disease: Secondary | ICD-10-CM | POA: Diagnosis not present

## 2013-03-13 DIAGNOSIS — N186 End stage renal disease: Secondary | ICD-10-CM | POA: Diagnosis not present

## 2013-03-13 DIAGNOSIS — D631 Anemia in chronic kidney disease: Secondary | ICD-10-CM | POA: Diagnosis not present

## 2013-03-13 DIAGNOSIS — R7309 Other abnormal glucose: Secondary | ICD-10-CM | POA: Diagnosis not present

## 2013-03-13 DIAGNOSIS — D473 Essential (hemorrhagic) thrombocythemia: Secondary | ICD-10-CM | POA: Diagnosis not present

## 2013-03-14 DIAGNOSIS — N186 End stage renal disease: Secondary | ICD-10-CM | POA: Diagnosis not present

## 2013-03-14 DIAGNOSIS — D631 Anemia in chronic kidney disease: Secondary | ICD-10-CM | POA: Diagnosis not present

## 2013-03-14 DIAGNOSIS — D473 Essential (hemorrhagic) thrombocythemia: Secondary | ICD-10-CM | POA: Diagnosis not present

## 2013-03-15 DIAGNOSIS — D631 Anemia in chronic kidney disease: Secondary | ICD-10-CM | POA: Diagnosis not present

## 2013-03-15 DIAGNOSIS — N186 End stage renal disease: Secondary | ICD-10-CM | POA: Diagnosis not present

## 2013-03-15 DIAGNOSIS — D473 Essential (hemorrhagic) thrombocythemia: Secondary | ICD-10-CM | POA: Diagnosis not present

## 2013-03-16 DIAGNOSIS — N186 End stage renal disease: Secondary | ICD-10-CM | POA: Diagnosis not present

## 2013-03-16 DIAGNOSIS — D631 Anemia in chronic kidney disease: Secondary | ICD-10-CM | POA: Diagnosis not present

## 2013-03-16 DIAGNOSIS — D473 Essential (hemorrhagic) thrombocythemia: Secondary | ICD-10-CM | POA: Diagnosis not present

## 2013-03-17 DIAGNOSIS — N186 End stage renal disease: Secondary | ICD-10-CM | POA: Diagnosis not present

## 2013-03-17 DIAGNOSIS — D631 Anemia in chronic kidney disease: Secondary | ICD-10-CM | POA: Diagnosis not present

## 2013-03-17 DIAGNOSIS — D473 Essential (hemorrhagic) thrombocythemia: Secondary | ICD-10-CM | POA: Diagnosis not present

## 2013-03-18 DIAGNOSIS — N186 End stage renal disease: Secondary | ICD-10-CM | POA: Diagnosis not present

## 2013-03-18 DIAGNOSIS — D631 Anemia in chronic kidney disease: Secondary | ICD-10-CM | POA: Diagnosis not present

## 2013-03-18 DIAGNOSIS — D473 Essential (hemorrhagic) thrombocythemia: Secondary | ICD-10-CM | POA: Diagnosis not present

## 2013-03-19 DIAGNOSIS — R7309 Other abnormal glucose: Secondary | ICD-10-CM | POA: Diagnosis not present

## 2013-03-19 DIAGNOSIS — D473 Essential (hemorrhagic) thrombocythemia: Secondary | ICD-10-CM | POA: Diagnosis not present

## 2013-03-19 DIAGNOSIS — D631 Anemia in chronic kidney disease: Secondary | ICD-10-CM | POA: Diagnosis not present

## 2013-03-19 DIAGNOSIS — N186 End stage renal disease: Secondary | ICD-10-CM | POA: Diagnosis not present

## 2013-03-20 DIAGNOSIS — N186 End stage renal disease: Secondary | ICD-10-CM | POA: Diagnosis not present

## 2013-03-20 DIAGNOSIS — D631 Anemia in chronic kidney disease: Secondary | ICD-10-CM | POA: Diagnosis not present

## 2013-03-20 DIAGNOSIS — D473 Essential (hemorrhagic) thrombocythemia: Secondary | ICD-10-CM | POA: Diagnosis not present

## 2013-03-21 DIAGNOSIS — D473 Essential (hemorrhagic) thrombocythemia: Secondary | ICD-10-CM | POA: Diagnosis not present

## 2013-03-21 DIAGNOSIS — N186 End stage renal disease: Secondary | ICD-10-CM | POA: Diagnosis not present

## 2013-03-21 DIAGNOSIS — D631 Anemia in chronic kidney disease: Secondary | ICD-10-CM | POA: Diagnosis not present

## 2013-03-22 DIAGNOSIS — N186 End stage renal disease: Secondary | ICD-10-CM | POA: Diagnosis not present

## 2013-03-22 DIAGNOSIS — D473 Essential (hemorrhagic) thrombocythemia: Secondary | ICD-10-CM | POA: Diagnosis not present

## 2013-03-22 DIAGNOSIS — D631 Anemia in chronic kidney disease: Secondary | ICD-10-CM | POA: Diagnosis not present

## 2013-03-23 DIAGNOSIS — D631 Anemia in chronic kidney disease: Secondary | ICD-10-CM | POA: Diagnosis not present

## 2013-03-23 DIAGNOSIS — D473 Essential (hemorrhagic) thrombocythemia: Secondary | ICD-10-CM | POA: Diagnosis not present

## 2013-03-23 DIAGNOSIS — N186 End stage renal disease: Secondary | ICD-10-CM | POA: Diagnosis not present

## 2013-03-24 DIAGNOSIS — D473 Essential (hemorrhagic) thrombocythemia: Secondary | ICD-10-CM | POA: Diagnosis not present

## 2013-03-24 DIAGNOSIS — D631 Anemia in chronic kidney disease: Secondary | ICD-10-CM | POA: Diagnosis not present

## 2013-03-24 DIAGNOSIS — N186 End stage renal disease: Secondary | ICD-10-CM | POA: Diagnosis not present

## 2013-03-25 DIAGNOSIS — D473 Essential (hemorrhagic) thrombocythemia: Secondary | ICD-10-CM | POA: Diagnosis not present

## 2013-03-25 DIAGNOSIS — N186 End stage renal disease: Secondary | ICD-10-CM | POA: Diagnosis not present

## 2013-03-25 DIAGNOSIS — D631 Anemia in chronic kidney disease: Secondary | ICD-10-CM | POA: Diagnosis not present

## 2013-03-26 DIAGNOSIS — D631 Anemia in chronic kidney disease: Secondary | ICD-10-CM | POA: Diagnosis not present

## 2013-03-26 DIAGNOSIS — D473 Essential (hemorrhagic) thrombocythemia: Secondary | ICD-10-CM | POA: Diagnosis not present

## 2013-03-26 DIAGNOSIS — N186 End stage renal disease: Secondary | ICD-10-CM | POA: Diagnosis not present

## 2013-03-27 DIAGNOSIS — N186 End stage renal disease: Secondary | ICD-10-CM | POA: Diagnosis not present

## 2013-03-27 DIAGNOSIS — D631 Anemia in chronic kidney disease: Secondary | ICD-10-CM | POA: Diagnosis not present

## 2013-03-27 DIAGNOSIS — D473 Essential (hemorrhagic) thrombocythemia: Secondary | ICD-10-CM | POA: Diagnosis not present

## 2013-03-28 DIAGNOSIS — D631 Anemia in chronic kidney disease: Secondary | ICD-10-CM | POA: Diagnosis not present

## 2013-03-28 DIAGNOSIS — D473 Essential (hemorrhagic) thrombocythemia: Secondary | ICD-10-CM | POA: Diagnosis not present

## 2013-03-28 DIAGNOSIS — N186 End stage renal disease: Secondary | ICD-10-CM | POA: Diagnosis not present

## 2013-03-29 DIAGNOSIS — D473 Essential (hemorrhagic) thrombocythemia: Secondary | ICD-10-CM | POA: Diagnosis not present

## 2013-03-29 DIAGNOSIS — D631 Anemia in chronic kidney disease: Secondary | ICD-10-CM | POA: Diagnosis not present

## 2013-03-29 DIAGNOSIS — N186 End stage renal disease: Secondary | ICD-10-CM | POA: Diagnosis not present

## 2013-03-30 DIAGNOSIS — D631 Anemia in chronic kidney disease: Secondary | ICD-10-CM | POA: Diagnosis not present

## 2013-03-30 DIAGNOSIS — N186 End stage renal disease: Secondary | ICD-10-CM | POA: Diagnosis not present

## 2013-03-30 DIAGNOSIS — D473 Essential (hemorrhagic) thrombocythemia: Secondary | ICD-10-CM | POA: Diagnosis not present

## 2013-03-31 DIAGNOSIS — N186 End stage renal disease: Secondary | ICD-10-CM | POA: Diagnosis not present

## 2013-03-31 DIAGNOSIS — D473 Essential (hemorrhagic) thrombocythemia: Secondary | ICD-10-CM | POA: Diagnosis not present

## 2013-03-31 DIAGNOSIS — D631 Anemia in chronic kidney disease: Secondary | ICD-10-CM | POA: Diagnosis not present

## 2013-04-01 DIAGNOSIS — D631 Anemia in chronic kidney disease: Secondary | ICD-10-CM | POA: Diagnosis not present

## 2013-04-01 DIAGNOSIS — D473 Essential (hemorrhagic) thrombocythemia: Secondary | ICD-10-CM | POA: Diagnosis not present

## 2013-04-01 DIAGNOSIS — N186 End stage renal disease: Secondary | ICD-10-CM | POA: Diagnosis not present

## 2013-04-02 DIAGNOSIS — D473 Essential (hemorrhagic) thrombocythemia: Secondary | ICD-10-CM | POA: Diagnosis not present

## 2013-04-02 DIAGNOSIS — D631 Anemia in chronic kidney disease: Secondary | ICD-10-CM | POA: Diagnosis not present

## 2013-04-02 DIAGNOSIS — N186 End stage renal disease: Secondary | ICD-10-CM | POA: Diagnosis not present

## 2013-04-03 DIAGNOSIS — T8610 Unspecified complication of kidney transplant: Secondary | ICD-10-CM | POA: Diagnosis not present

## 2013-04-03 DIAGNOSIS — Z940 Kidney transplant status: Secondary | ICD-10-CM | POA: Diagnosis not present

## 2013-04-03 DIAGNOSIS — D509 Iron deficiency anemia, unspecified: Secondary | ICD-10-CM | POA: Diagnosis present

## 2013-04-03 DIAGNOSIS — J90 Pleural effusion, not elsewhere classified: Secondary | ICD-10-CM | POA: Diagnosis not present

## 2013-04-03 DIAGNOSIS — D696 Thrombocytopenia, unspecified: Secondary | ICD-10-CM | POA: Diagnosis not present

## 2013-04-03 DIAGNOSIS — G8918 Other acute postprocedural pain: Secondary | ICD-10-CM | POA: Diagnosis not present

## 2013-04-03 DIAGNOSIS — N186 End stage renal disease: Secondary | ICD-10-CM | POA: Diagnosis not present

## 2013-04-03 DIAGNOSIS — Z452 Encounter for adjustment and management of vascular access device: Secondary | ICD-10-CM | POA: Diagnosis not present

## 2013-04-03 DIAGNOSIS — I5032 Chronic diastolic (congestive) heart failure: Secondary | ICD-10-CM | POA: Diagnosis not present

## 2013-04-03 DIAGNOSIS — Z79899 Other long term (current) drug therapy: Secondary | ICD-10-CM | POA: Diagnosis not present

## 2013-04-03 DIAGNOSIS — G4733 Obstructive sleep apnea (adult) (pediatric): Secondary | ICD-10-CM | POA: Diagnosis not present

## 2013-04-03 DIAGNOSIS — I120 Hypertensive chronic kidney disease with stage 5 chronic kidney disease or end stage renal disease: Secondary | ICD-10-CM | POA: Diagnosis not present

## 2013-04-03 DIAGNOSIS — M109 Gout, unspecified: Secondary | ICD-10-CM | POA: Diagnosis present

## 2013-04-03 DIAGNOSIS — J984 Other disorders of lung: Secondary | ICD-10-CM | POA: Diagnosis not present

## 2013-04-03 DIAGNOSIS — I10 Essential (primary) hypertension: Secondary | ICD-10-CM | POA: Diagnosis not present

## 2013-04-03 DIAGNOSIS — Z8249 Family history of ischemic heart disease and other diseases of the circulatory system: Secondary | ICD-10-CM | POA: Diagnosis not present

## 2013-04-03 DIAGNOSIS — Z09 Encounter for follow-up examination after completed treatment for conditions other than malignant neoplasm: Secondary | ICD-10-CM | POA: Diagnosis not present

## 2013-04-03 DIAGNOSIS — Z87891 Personal history of nicotine dependence: Secondary | ICD-10-CM | POA: Diagnosis not present

## 2013-04-03 DIAGNOSIS — R7309 Other abnormal glucose: Secondary | ICD-10-CM | POA: Diagnosis not present

## 2013-04-03 DIAGNOSIS — Z48298 Encounter for aftercare following other organ transplant: Secondary | ICD-10-CM | POA: Diagnosis not present

## 2013-04-03 DIAGNOSIS — R34 Anuria and oliguria: Secondary | ICD-10-CM | POA: Diagnosis not present

## 2013-04-03 DIAGNOSIS — R935 Abnormal findings on diagnostic imaging of other abdominal regions, including retroperitoneum: Secondary | ICD-10-CM | POA: Diagnosis not present

## 2013-04-03 DIAGNOSIS — Z01818 Encounter for other preprocedural examination: Secondary | ICD-10-CM | POA: Diagnosis not present

## 2013-04-03 DIAGNOSIS — N19 Unspecified kidney failure: Secondary | ICD-10-CM | POA: Diagnosis not present

## 2013-04-03 DIAGNOSIS — I519 Heart disease, unspecified: Secondary | ICD-10-CM | POA: Diagnosis not present

## 2013-04-03 DIAGNOSIS — I2789 Other specified pulmonary heart diseases: Secondary | ICD-10-CM | POA: Diagnosis not present

## 2013-04-03 DIAGNOSIS — Z5181 Encounter for therapeutic drug level monitoring: Secondary | ICD-10-CM | POA: Diagnosis not present

## 2013-04-03 DIAGNOSIS — I499 Cardiac arrhythmia, unspecified: Secondary | ICD-10-CM | POA: Diagnosis not present

## 2013-04-03 DIAGNOSIS — N179 Acute kidney failure, unspecified: Secondary | ICD-10-CM | POA: Diagnosis not present

## 2013-04-03 DIAGNOSIS — D631 Anemia in chronic kidney disease: Secondary | ICD-10-CM | POA: Diagnosis not present

## 2013-04-03 DIAGNOSIS — D473 Essential (hemorrhagic) thrombocythemia: Secondary | ICD-10-CM | POA: Diagnosis present

## 2013-04-03 DIAGNOSIS — R079 Chest pain, unspecified: Secondary | ICD-10-CM | POA: Diagnosis not present

## 2013-04-03 DIAGNOSIS — Z992 Dependence on renal dialysis: Secondary | ICD-10-CM | POA: Diagnosis not present

## 2013-04-03 DIAGNOSIS — D62 Acute posthemorrhagic anemia: Secondary | ICD-10-CM | POA: Diagnosis not present

## 2013-04-03 DIAGNOSIS — I700 Atherosclerosis of aorta: Secondary | ICD-10-CM | POA: Diagnosis not present

## 2013-04-03 DIAGNOSIS — I959 Hypotension, unspecified: Secondary | ICD-10-CM | POA: Diagnosis not present

## 2013-04-03 DIAGNOSIS — I517 Cardiomegaly: Secondary | ICD-10-CM | POA: Diagnosis not present

## 2013-04-04 HISTORY — PX: KIDNEY TRANSPLANT: SHX239

## 2013-04-18 DIAGNOSIS — Z79899 Other long term (current) drug therapy: Secondary | ICD-10-CM | POA: Diagnosis not present

## 2013-04-18 DIAGNOSIS — Z940 Kidney transplant status: Secondary | ICD-10-CM | POA: Diagnosis not present

## 2013-04-19 DIAGNOSIS — D473 Essential (hemorrhagic) thrombocythemia: Secondary | ICD-10-CM | POA: Diagnosis not present

## 2013-04-19 DIAGNOSIS — T82898A Other specified complication of vascular prosthetic devices, implants and grafts, initial encounter: Secondary | ICD-10-CM | POA: Diagnosis not present

## 2013-04-19 DIAGNOSIS — N186 End stage renal disease: Secondary | ICD-10-CM | POA: Diagnosis not present

## 2013-04-19 DIAGNOSIS — D631 Anemia in chronic kidney disease: Secondary | ICD-10-CM | POA: Diagnosis not present

## 2013-04-19 DIAGNOSIS — I509 Heart failure, unspecified: Secondary | ICD-10-CM | POA: Diagnosis not present

## 2013-04-21 DIAGNOSIS — D631 Anemia in chronic kidney disease: Secondary | ICD-10-CM | POA: Diagnosis not present

## 2013-04-21 DIAGNOSIS — T82898A Other specified complication of vascular prosthetic devices, implants and grafts, initial encounter: Secondary | ICD-10-CM | POA: Diagnosis not present

## 2013-04-21 DIAGNOSIS — I509 Heart failure, unspecified: Secondary | ICD-10-CM | POA: Diagnosis not present

## 2013-04-21 DIAGNOSIS — N186 End stage renal disease: Secondary | ICD-10-CM | POA: Diagnosis not present

## 2013-04-21 DIAGNOSIS — D473 Essential (hemorrhagic) thrombocythemia: Secondary | ICD-10-CM | POA: Diagnosis not present

## 2013-04-23 DIAGNOSIS — Z940 Kidney transplant status: Secondary | ICD-10-CM | POA: Diagnosis not present

## 2013-04-23 DIAGNOSIS — T82898A Other specified complication of vascular prosthetic devices, implants and grafts, initial encounter: Secondary | ICD-10-CM | POA: Diagnosis not present

## 2013-04-23 DIAGNOSIS — I509 Heart failure, unspecified: Secondary | ICD-10-CM | POA: Diagnosis not present

## 2013-04-23 DIAGNOSIS — N186 End stage renal disease: Secondary | ICD-10-CM | POA: Diagnosis not present

## 2013-04-23 DIAGNOSIS — D473 Essential (hemorrhagic) thrombocythemia: Secondary | ICD-10-CM | POA: Diagnosis not present

## 2013-04-23 DIAGNOSIS — Z79899 Other long term (current) drug therapy: Secondary | ICD-10-CM | POA: Diagnosis not present

## 2013-04-23 DIAGNOSIS — D631 Anemia in chronic kidney disease: Secondary | ICD-10-CM | POA: Diagnosis not present

## 2013-04-24 ENCOUNTER — Encounter: Payer: Medicare Other | Admitting: Internal Medicine

## 2013-04-24 DIAGNOSIS — T82898A Other specified complication of vascular prosthetic devices, implants and grafts, initial encounter: Secondary | ICD-10-CM | POA: Diagnosis not present

## 2013-04-24 DIAGNOSIS — N186 End stage renal disease: Secondary | ICD-10-CM | POA: Diagnosis not present

## 2013-04-24 DIAGNOSIS — D473 Essential (hemorrhagic) thrombocythemia: Secondary | ICD-10-CM | POA: Diagnosis not present

## 2013-04-24 DIAGNOSIS — D631 Anemia in chronic kidney disease: Secondary | ICD-10-CM | POA: Diagnosis not present

## 2013-04-24 DIAGNOSIS — I509 Heart failure, unspecified: Secondary | ICD-10-CM | POA: Diagnosis not present

## 2013-04-25 DIAGNOSIS — E873 Alkalosis: Secondary | ICD-10-CM | POA: Diagnosis not present

## 2013-04-25 DIAGNOSIS — J449 Chronic obstructive pulmonary disease, unspecified: Secondary | ICD-10-CM | POA: Diagnosis not present

## 2013-04-25 DIAGNOSIS — I959 Hypotension, unspecified: Secondary | ICD-10-CM | POA: Diagnosis not present

## 2013-04-25 DIAGNOSIS — Z940 Kidney transplant status: Secondary | ICD-10-CM | POA: Diagnosis not present

## 2013-04-25 DIAGNOSIS — T8610 Unspecified complication of kidney transplant: Secondary | ICD-10-CM | POA: Diagnosis not present

## 2013-04-25 DIAGNOSIS — Z48298 Encounter for aftercare following other organ transplant: Secondary | ICD-10-CM | POA: Diagnosis not present

## 2013-04-25 DIAGNOSIS — Z5181 Encounter for therapeutic drug level monitoring: Secondary | ICD-10-CM | POA: Diagnosis not present

## 2013-04-25 DIAGNOSIS — Z992 Dependence on renal dialysis: Secondary | ICD-10-CM | POA: Diagnosis not present

## 2013-04-25 DIAGNOSIS — Z09 Encounter for follow-up examination after completed treatment for conditions other than malignant neoplasm: Secondary | ICD-10-CM | POA: Diagnosis not present

## 2013-04-25 DIAGNOSIS — D649 Anemia, unspecified: Secondary | ICD-10-CM | POA: Diagnosis not present

## 2013-04-25 DIAGNOSIS — E872 Acidosis: Secondary | ICD-10-CM | POA: Diagnosis not present

## 2013-04-25 DIAGNOSIS — Z79899 Other long term (current) drug therapy: Secondary | ICD-10-CM | POA: Diagnosis not present

## 2013-04-26 DIAGNOSIS — Z79899 Other long term (current) drug therapy: Secondary | ICD-10-CM | POA: Diagnosis not present

## 2013-04-26 DIAGNOSIS — Z940 Kidney transplant status: Secondary | ICD-10-CM | POA: Diagnosis not present

## 2013-04-27 DIAGNOSIS — Z940 Kidney transplant status: Secondary | ICD-10-CM | POA: Diagnosis not present

## 2013-04-27 DIAGNOSIS — Z79899 Other long term (current) drug therapy: Secondary | ICD-10-CM | POA: Diagnosis not present

## 2013-04-30 DIAGNOSIS — Z79899 Other long term (current) drug therapy: Secondary | ICD-10-CM | POA: Diagnosis not present

## 2013-04-30 DIAGNOSIS — Z940 Kidney transplant status: Secondary | ICD-10-CM | POA: Diagnosis not present

## 2013-05-01 DIAGNOSIS — D473 Essential (hemorrhagic) thrombocythemia: Secondary | ICD-10-CM | POA: Diagnosis not present

## 2013-05-02 DIAGNOSIS — Z79899 Other long term (current) drug therapy: Secondary | ICD-10-CM | POA: Diagnosis not present

## 2013-05-02 DIAGNOSIS — Z940 Kidney transplant status: Secondary | ICD-10-CM | POA: Diagnosis not present

## 2013-05-04 DIAGNOSIS — Z79899 Other long term (current) drug therapy: Secondary | ICD-10-CM | POA: Diagnosis not present

## 2013-05-04 DIAGNOSIS — Z940 Kidney transplant status: Secondary | ICD-10-CM | POA: Diagnosis not present

## 2013-05-07 DIAGNOSIS — Z940 Kidney transplant status: Secondary | ICD-10-CM | POA: Diagnosis not present

## 2013-05-07 DIAGNOSIS — Z79899 Other long term (current) drug therapy: Secondary | ICD-10-CM | POA: Diagnosis not present

## 2013-05-09 DIAGNOSIS — Z940 Kidney transplant status: Secondary | ICD-10-CM | POA: Diagnosis not present

## 2013-05-09 DIAGNOSIS — Z452 Encounter for adjustment and management of vascular access device: Secondary | ICD-10-CM | POA: Diagnosis not present

## 2013-05-10 DIAGNOSIS — I509 Heart failure, unspecified: Secondary | ICD-10-CM | POA: Diagnosis not present

## 2013-05-10 DIAGNOSIS — J449 Chronic obstructive pulmonary disease, unspecified: Secondary | ICD-10-CM | POA: Diagnosis not present

## 2013-05-10 DIAGNOSIS — Z5181 Encounter for therapeutic drug level monitoring: Secondary | ICD-10-CM | POA: Diagnosis not present

## 2013-05-10 DIAGNOSIS — D6489 Other specified anemias: Secondary | ICD-10-CM | POA: Diagnosis not present

## 2013-05-10 DIAGNOSIS — Z09 Encounter for follow-up examination after completed treatment for conditions other than malignant neoplasm: Secondary | ICD-10-CM | POA: Diagnosis not present

## 2013-05-10 DIAGNOSIS — M109 Gout, unspecified: Secondary | ICD-10-CM | POA: Diagnosis not present

## 2013-05-10 DIAGNOSIS — Z79899 Other long term (current) drug therapy: Secondary | ICD-10-CM | POA: Diagnosis not present

## 2013-05-10 DIAGNOSIS — Z940 Kidney transplant status: Secondary | ICD-10-CM | POA: Diagnosis not present

## 2013-05-10 DIAGNOSIS — I10 Essential (primary) hypertension: Secondary | ICD-10-CM | POA: Diagnosis not present

## 2013-05-10 DIAGNOSIS — D473 Essential (hemorrhagic) thrombocythemia: Secondary | ICD-10-CM | POA: Diagnosis not present

## 2013-05-10 DIAGNOSIS — Z48298 Encounter for aftercare following other organ transplant: Secondary | ICD-10-CM | POA: Diagnosis not present

## 2013-05-14 DIAGNOSIS — Z79899 Other long term (current) drug therapy: Secondary | ICD-10-CM | POA: Diagnosis not present

## 2013-05-14 DIAGNOSIS — Z940 Kidney transplant status: Secondary | ICD-10-CM | POA: Diagnosis not present

## 2013-05-15 DIAGNOSIS — G4733 Obstructive sleep apnea (adult) (pediatric): Secondary | ICD-10-CM | POA: Diagnosis not present

## 2013-05-15 DIAGNOSIS — I129 Hypertensive chronic kidney disease with stage 1 through stage 4 chronic kidney disease, or unspecified chronic kidney disease: Secondary | ICD-10-CM | POA: Diagnosis not present

## 2013-05-15 DIAGNOSIS — I509 Heart failure, unspecified: Secondary | ICD-10-CM | POA: Diagnosis not present

## 2013-05-15 DIAGNOSIS — K5520 Angiodysplasia of colon without hemorrhage: Secondary | ICD-10-CM | POA: Diagnosis not present

## 2013-05-15 DIAGNOSIS — Z79899 Other long term (current) drug therapy: Secondary | ICD-10-CM | POA: Diagnosis not present

## 2013-05-15 DIAGNOSIS — I5032 Chronic diastolic (congestive) heart failure: Secondary | ICD-10-CM | POA: Diagnosis not present

## 2013-05-15 DIAGNOSIS — Z4902 Encounter for fitting and adjustment of peritoneal dialysis catheter: Secondary | ICD-10-CM | POA: Diagnosis not present

## 2013-05-15 DIAGNOSIS — N184 Chronic kidney disease, stage 4 (severe): Secondary | ICD-10-CM | POA: Diagnosis not present

## 2013-05-15 DIAGNOSIS — D473 Essential (hemorrhagic) thrombocythemia: Secondary | ICD-10-CM | POA: Diagnosis not present

## 2013-05-15 DIAGNOSIS — Z940 Kidney transplant status: Secondary | ICD-10-CM | POA: Diagnosis not present

## 2013-05-15 DIAGNOSIS — Z87891 Personal history of nicotine dependence: Secondary | ICD-10-CM | POA: Diagnosis not present

## 2013-05-15 DIAGNOSIS — T190XXA Foreign body in urethra, initial encounter: Secondary | ICD-10-CM | POA: Diagnosis not present

## 2013-05-15 DIAGNOSIS — J449 Chronic obstructive pulmonary disease, unspecified: Secondary | ICD-10-CM | POA: Diagnosis not present

## 2013-05-15 DIAGNOSIS — N135 Crossing vessel and stricture of ureter without hydronephrosis: Secondary | ICD-10-CM | POA: Diagnosis not present

## 2013-05-15 DIAGNOSIS — Z466 Encounter for fitting and adjustment of urinary device: Secondary | ICD-10-CM | POA: Diagnosis not present

## 2013-05-15 DIAGNOSIS — M109 Gout, unspecified: Secondary | ICD-10-CM | POA: Diagnosis not present

## 2013-05-15 DIAGNOSIS — N186 End stage renal disease: Secondary | ICD-10-CM | POA: Diagnosis not present

## 2013-05-15 DIAGNOSIS — D631 Anemia in chronic kidney disease: Secondary | ICD-10-CM | POA: Diagnosis not present

## 2013-05-17 ENCOUNTER — Other Ambulatory Visit: Payer: Self-pay

## 2013-05-17 DIAGNOSIS — Z940 Kidney transplant status: Secondary | ICD-10-CM | POA: Diagnosis not present

## 2013-05-17 DIAGNOSIS — Z79899 Other long term (current) drug therapy: Secondary | ICD-10-CM | POA: Diagnosis not present

## 2013-05-21 DIAGNOSIS — Z940 Kidney transplant status: Secondary | ICD-10-CM | POA: Diagnosis not present

## 2013-05-21 DIAGNOSIS — Z79899 Other long term (current) drug therapy: Secondary | ICD-10-CM | POA: Diagnosis not present

## 2013-05-21 DIAGNOSIS — H35059 Retinal neovascularization, unspecified, unspecified eye: Secondary | ICD-10-CM | POA: Diagnosis not present

## 2013-05-24 DIAGNOSIS — Z79899 Other long term (current) drug therapy: Secondary | ICD-10-CM | POA: Diagnosis not present

## 2013-05-24 DIAGNOSIS — Z940 Kidney transplant status: Secondary | ICD-10-CM | POA: Diagnosis not present

## 2013-05-25 DIAGNOSIS — I509 Heart failure, unspecified: Secondary | ICD-10-CM | POA: Diagnosis not present

## 2013-05-25 DIAGNOSIS — G4733 Obstructive sleep apnea (adult) (pediatric): Secondary | ICD-10-CM | POA: Diagnosis not present

## 2013-05-25 DIAGNOSIS — R0902 Hypoxemia: Secondary | ICD-10-CM | POA: Diagnosis not present

## 2013-05-29 DIAGNOSIS — Z940 Kidney transplant status: Secondary | ICD-10-CM | POA: Diagnosis not present

## 2013-05-29 DIAGNOSIS — D649 Anemia, unspecified: Secondary | ICD-10-CM | POA: Diagnosis not present

## 2013-05-29 DIAGNOSIS — N269 Renal sclerosis, unspecified: Secondary | ICD-10-CM | POA: Diagnosis not present

## 2013-05-29 DIAGNOSIS — Z48298 Encounter for aftercare following other organ transplant: Secondary | ICD-10-CM | POA: Diagnosis not present

## 2013-05-29 DIAGNOSIS — R259 Unspecified abnormal involuntary movements: Secondary | ICD-10-CM | POA: Diagnosis not present

## 2013-05-29 DIAGNOSIS — J449 Chronic obstructive pulmonary disease, unspecified: Secondary | ICD-10-CM | POA: Diagnosis not present

## 2013-05-29 DIAGNOSIS — N289 Disorder of kidney and ureter, unspecified: Secondary | ICD-10-CM | POA: Diagnosis not present

## 2013-05-29 DIAGNOSIS — R05 Cough: Secondary | ICD-10-CM | POA: Diagnosis not present

## 2013-05-29 DIAGNOSIS — R0989 Other specified symptoms and signs involving the circulatory and respiratory systems: Secondary | ICD-10-CM | POA: Diagnosis not present

## 2013-05-29 DIAGNOSIS — R894 Abnormal immunological findings in specimens from other organs, systems and tissues: Secondary | ICD-10-CM | POA: Diagnosis not present

## 2013-05-29 DIAGNOSIS — Z79899 Other long term (current) drug therapy: Secondary | ICD-10-CM | POA: Diagnosis not present

## 2013-05-29 DIAGNOSIS — E875 Hyperkalemia: Secondary | ICD-10-CM | POA: Diagnosis not present

## 2013-05-29 DIAGNOSIS — M109 Gout, unspecified: Secondary | ICD-10-CM | POA: Diagnosis not present

## 2013-05-29 DIAGNOSIS — D473 Essential (hemorrhagic) thrombocythemia: Secondary | ICD-10-CM | POA: Diagnosis not present

## 2013-05-29 DIAGNOSIS — Z09 Encounter for follow-up examination after completed treatment for conditions other than malignant neoplasm: Secondary | ICD-10-CM | POA: Diagnosis not present

## 2013-05-29 DIAGNOSIS — I10 Essential (primary) hypertension: Secondary | ICD-10-CM | POA: Diagnosis not present

## 2013-05-31 DIAGNOSIS — Z79899 Other long term (current) drug therapy: Secondary | ICD-10-CM | POA: Diagnosis not present

## 2013-05-31 DIAGNOSIS — Z940 Kidney transplant status: Secondary | ICD-10-CM | POA: Diagnosis not present

## 2013-06-04 DIAGNOSIS — R7309 Other abnormal glucose: Secondary | ICD-10-CM | POA: Diagnosis not present

## 2013-06-04 DIAGNOSIS — N186 End stage renal disease: Secondary | ICD-10-CM | POA: Diagnosis not present

## 2013-06-08 DIAGNOSIS — Z940 Kidney transplant status: Secondary | ICD-10-CM | POA: Diagnosis not present

## 2013-06-08 DIAGNOSIS — Z79899 Other long term (current) drug therapy: Secondary | ICD-10-CM | POA: Diagnosis not present

## 2013-06-11 DIAGNOSIS — Z940 Kidney transplant status: Secondary | ICD-10-CM | POA: Diagnosis not present

## 2013-06-11 DIAGNOSIS — Z79899 Other long term (current) drug therapy: Secondary | ICD-10-CM | POA: Diagnosis not present

## 2013-06-14 DIAGNOSIS — Z940 Kidney transplant status: Secondary | ICD-10-CM | POA: Diagnosis not present

## 2013-06-14 DIAGNOSIS — Z79899 Other long term (current) drug therapy: Secondary | ICD-10-CM | POA: Diagnosis not present

## 2013-06-18 DIAGNOSIS — Z940 Kidney transplant status: Secondary | ICD-10-CM | POA: Diagnosis not present

## 2013-06-18 DIAGNOSIS — Z79899 Other long term (current) drug therapy: Secondary | ICD-10-CM | POA: Diagnosis not present

## 2013-06-21 DIAGNOSIS — Z940 Kidney transplant status: Secondary | ICD-10-CM | POA: Diagnosis not present

## 2013-06-21 DIAGNOSIS — Z79899 Other long term (current) drug therapy: Secondary | ICD-10-CM | POA: Diagnosis not present

## 2013-06-26 DIAGNOSIS — Z79899 Other long term (current) drug therapy: Secondary | ICD-10-CM | POA: Diagnosis not present

## 2013-06-26 DIAGNOSIS — J449 Chronic obstructive pulmonary disease, unspecified: Secondary | ICD-10-CM | POA: Diagnosis not present

## 2013-06-26 DIAGNOSIS — I509 Heart failure, unspecified: Secondary | ICD-10-CM | POA: Diagnosis not present

## 2013-06-26 DIAGNOSIS — Z48298 Encounter for aftercare following other organ transplant: Secondary | ICD-10-CM | POA: Diagnosis not present

## 2013-06-26 DIAGNOSIS — Z5181 Encounter for therapeutic drug level monitoring: Secondary | ICD-10-CM | POA: Diagnosis not present

## 2013-06-26 DIAGNOSIS — D473 Essential (hemorrhagic) thrombocythemia: Secondary | ICD-10-CM | POA: Diagnosis not present

## 2013-06-26 DIAGNOSIS — D6489 Other specified anemias: Secondary | ICD-10-CM | POA: Diagnosis not present

## 2013-06-26 DIAGNOSIS — D72819 Decreased white blood cell count, unspecified: Secondary | ICD-10-CM | POA: Diagnosis not present

## 2013-06-26 DIAGNOSIS — I10 Essential (primary) hypertension: Secondary | ICD-10-CM | POA: Diagnosis not present

## 2013-06-26 DIAGNOSIS — Z09 Encounter for follow-up examination after completed treatment for conditions other than malignant neoplasm: Secondary | ICD-10-CM | POA: Diagnosis not present

## 2013-06-26 DIAGNOSIS — J3489 Other specified disorders of nose and nasal sinuses: Secondary | ICD-10-CM | POA: Diagnosis not present

## 2013-06-26 DIAGNOSIS — Z940 Kidney transplant status: Secondary | ICD-10-CM | POA: Diagnosis not present

## 2013-06-28 DIAGNOSIS — Z79899 Other long term (current) drug therapy: Secondary | ICD-10-CM | POA: Diagnosis not present

## 2013-06-28 DIAGNOSIS — Z940 Kidney transplant status: Secondary | ICD-10-CM | POA: Diagnosis not present

## 2013-07-02 DIAGNOSIS — Z79899 Other long term (current) drug therapy: Secondary | ICD-10-CM | POA: Diagnosis not present

## 2013-07-02 DIAGNOSIS — Z940 Kidney transplant status: Secondary | ICD-10-CM | POA: Diagnosis not present

## 2013-07-09 ENCOUNTER — Encounter: Payer: Medicare Other | Admitting: Internal Medicine

## 2013-07-09 DIAGNOSIS — Z940 Kidney transplant status: Secondary | ICD-10-CM | POA: Diagnosis not present

## 2013-07-09 DIAGNOSIS — Z79899 Other long term (current) drug therapy: Secondary | ICD-10-CM | POA: Diagnosis not present

## 2013-07-13 DIAGNOSIS — Z79899 Other long term (current) drug therapy: Secondary | ICD-10-CM | POA: Diagnosis not present

## 2013-07-13 DIAGNOSIS — Z940 Kidney transplant status: Secondary | ICD-10-CM | POA: Diagnosis not present

## 2013-07-17 DIAGNOSIS — Z940 Kidney transplant status: Secondary | ICD-10-CM | POA: Diagnosis not present

## 2013-07-17 DIAGNOSIS — Z79899 Other long term (current) drug therapy: Secondary | ICD-10-CM | POA: Diagnosis not present

## 2013-07-23 DIAGNOSIS — Z79899 Other long term (current) drug therapy: Secondary | ICD-10-CM | POA: Diagnosis not present

## 2013-07-23 DIAGNOSIS — Z940 Kidney transplant status: Secondary | ICD-10-CM | POA: Diagnosis not present

## 2013-07-26 DIAGNOSIS — D473 Essential (hemorrhagic) thrombocythemia: Secondary | ICD-10-CM | POA: Diagnosis not present

## 2013-07-27 DIAGNOSIS — Z940 Kidney transplant status: Secondary | ICD-10-CM | POA: Diagnosis not present

## 2013-07-27 DIAGNOSIS — Z79899 Other long term (current) drug therapy: Secondary | ICD-10-CM | POA: Diagnosis not present

## 2013-07-30 DIAGNOSIS — Z940 Kidney transplant status: Secondary | ICD-10-CM | POA: Diagnosis not present

## 2013-07-30 DIAGNOSIS — Z79899 Other long term (current) drug therapy: Secondary | ICD-10-CM | POA: Diagnosis not present

## 2013-07-30 DIAGNOSIS — G4733 Obstructive sleep apnea (adult) (pediatric): Secondary | ICD-10-CM | POA: Diagnosis not present

## 2013-08-02 DIAGNOSIS — Z79899 Other long term (current) drug therapy: Secondary | ICD-10-CM | POA: Diagnosis not present

## 2013-08-02 DIAGNOSIS — Z940 Kidney transplant status: Secondary | ICD-10-CM | POA: Diagnosis not present

## 2013-08-06 DIAGNOSIS — Z79899 Other long term (current) drug therapy: Secondary | ICD-10-CM | POA: Diagnosis not present

## 2013-08-06 DIAGNOSIS — Z940 Kidney transplant status: Secondary | ICD-10-CM | POA: Diagnosis not present

## 2013-08-10 DIAGNOSIS — Z940 Kidney transplant status: Secondary | ICD-10-CM | POA: Diagnosis not present

## 2013-08-10 DIAGNOSIS — Z79899 Other long term (current) drug therapy: Secondary | ICD-10-CM | POA: Diagnosis not present

## 2013-08-13 DIAGNOSIS — Z79899 Other long term (current) drug therapy: Secondary | ICD-10-CM | POA: Diagnosis not present

## 2013-08-13 DIAGNOSIS — Z940 Kidney transplant status: Secondary | ICD-10-CM | POA: Diagnosis not present

## 2013-08-16 DIAGNOSIS — Z940 Kidney transplant status: Secondary | ICD-10-CM | POA: Diagnosis not present

## 2013-08-16 DIAGNOSIS — Z79899 Other long term (current) drug therapy: Secondary | ICD-10-CM | POA: Diagnosis not present

## 2013-08-21 DIAGNOSIS — I10 Essential (primary) hypertension: Secondary | ICD-10-CM | POA: Diagnosis not present

## 2013-08-21 DIAGNOSIS — Z09 Encounter for follow-up examination after completed treatment for conditions other than malignant neoplasm: Secondary | ICD-10-CM | POA: Diagnosis not present

## 2013-08-21 DIAGNOSIS — D473 Essential (hemorrhagic) thrombocythemia: Secondary | ICD-10-CM | POA: Diagnosis not present

## 2013-08-21 DIAGNOSIS — Z5181 Encounter for therapeutic drug level monitoring: Secondary | ICD-10-CM | POA: Diagnosis not present

## 2013-08-21 DIAGNOSIS — D72819 Decreased white blood cell count, unspecified: Secondary | ICD-10-CM | POA: Diagnosis not present

## 2013-08-21 DIAGNOSIS — R011 Cardiac murmur, unspecified: Secondary | ICD-10-CM | POA: Diagnosis not present

## 2013-08-21 DIAGNOSIS — J449 Chronic obstructive pulmonary disease, unspecified: Secondary | ICD-10-CM | POA: Diagnosis not present

## 2013-08-21 DIAGNOSIS — Z940 Kidney transplant status: Secondary | ICD-10-CM | POA: Diagnosis not present

## 2013-08-21 DIAGNOSIS — Z79899 Other long term (current) drug therapy: Secondary | ICD-10-CM | POA: Diagnosis not present

## 2013-08-21 DIAGNOSIS — Z48298 Encounter for aftercare following other organ transplant: Secondary | ICD-10-CM | POA: Diagnosis not present

## 2013-08-21 DIAGNOSIS — M109 Gout, unspecified: Secondary | ICD-10-CM | POA: Diagnosis not present

## 2013-08-21 DIAGNOSIS — I509 Heart failure, unspecified: Secondary | ICD-10-CM | POA: Diagnosis not present

## 2013-08-27 DIAGNOSIS — Z940 Kidney transplant status: Secondary | ICD-10-CM | POA: Diagnosis not present

## 2013-08-27 DIAGNOSIS — Z79899 Other long term (current) drug therapy: Secondary | ICD-10-CM | POA: Diagnosis not present

## 2013-09-03 DIAGNOSIS — Z79899 Other long term (current) drug therapy: Secondary | ICD-10-CM | POA: Diagnosis not present

## 2013-09-03 DIAGNOSIS — Z940 Kidney transplant status: Secondary | ICD-10-CM | POA: Diagnosis not present

## 2013-09-04 ENCOUNTER — Encounter: Payer: Medicare Other | Admitting: Internal Medicine

## 2013-09-10 DIAGNOSIS — Z79899 Other long term (current) drug therapy: Secondary | ICD-10-CM | POA: Diagnosis not present

## 2013-09-10 DIAGNOSIS — Z940 Kidney transplant status: Secondary | ICD-10-CM | POA: Diagnosis not present

## 2013-09-13 ENCOUNTER — Ambulatory Visit: Payer: Self-pay | Admitting: Internal Medicine

## 2013-09-18 DIAGNOSIS — I10 Essential (primary) hypertension: Secondary | ICD-10-CM | POA: Diagnosis not present

## 2013-09-18 DIAGNOSIS — D72819 Decreased white blood cell count, unspecified: Secondary | ICD-10-CM | POA: Diagnosis not present

## 2013-09-18 DIAGNOSIS — Z09 Encounter for follow-up examination after completed treatment for conditions other than malignant neoplasm: Secondary | ICD-10-CM | POA: Diagnosis not present

## 2013-09-18 DIAGNOSIS — Z48298 Encounter for aftercare following other organ transplant: Secondary | ICD-10-CM | POA: Diagnosis not present

## 2013-09-18 DIAGNOSIS — Z5181 Encounter for therapeutic drug level monitoring: Secondary | ICD-10-CM | POA: Diagnosis not present

## 2013-09-18 DIAGNOSIS — I509 Heart failure, unspecified: Secondary | ICD-10-CM | POA: Diagnosis not present

## 2013-09-18 DIAGNOSIS — M109 Gout, unspecified: Secondary | ICD-10-CM | POA: Diagnosis not present

## 2013-09-18 DIAGNOSIS — Z79899 Other long term (current) drug therapy: Secondary | ICD-10-CM | POA: Diagnosis not present

## 2013-09-18 DIAGNOSIS — D649 Anemia, unspecified: Secondary | ICD-10-CM | POA: Diagnosis not present

## 2013-09-18 DIAGNOSIS — J449 Chronic obstructive pulmonary disease, unspecified: Secondary | ICD-10-CM | POA: Diagnosis not present

## 2013-09-18 DIAGNOSIS — D473 Essential (hemorrhagic) thrombocythemia: Secondary | ICD-10-CM | POA: Diagnosis not present

## 2013-09-18 DIAGNOSIS — D6489 Other specified anemias: Secondary | ICD-10-CM | POA: Diagnosis not present

## 2013-09-18 DIAGNOSIS — Z940 Kidney transplant status: Secondary | ICD-10-CM | POA: Diagnosis not present

## 2013-09-24 DIAGNOSIS — Z79899 Other long term (current) drug therapy: Secondary | ICD-10-CM | POA: Diagnosis not present

## 2013-09-24 DIAGNOSIS — Z940 Kidney transplant status: Secondary | ICD-10-CM | POA: Diagnosis not present

## 2013-09-27 ENCOUNTER — Encounter: Payer: Medicare Other | Admitting: Internal Medicine

## 2013-10-01 DIAGNOSIS — Z940 Kidney transplant status: Secondary | ICD-10-CM | POA: Diagnosis not present

## 2013-10-01 DIAGNOSIS — Z79899 Other long term (current) drug therapy: Secondary | ICD-10-CM | POA: Diagnosis not present

## 2013-10-09 ENCOUNTER — Encounter: Payer: Self-pay | Admitting: Internal Medicine

## 2013-10-09 ENCOUNTER — Ambulatory Visit (INDEPENDENT_AMBULATORY_CARE_PROVIDER_SITE_OTHER): Payer: Medicare Other | Admitting: Internal Medicine

## 2013-10-09 ENCOUNTER — Other Ambulatory Visit (HOSPITAL_COMMUNITY)
Admission: RE | Admit: 2013-10-09 | Discharge: 2013-10-09 | Disposition: A | Discharge status: Home / Self Care | Payer: Medicare Other | Source: Ambulatory Visit | Attending: Internal Medicine | Admitting: Internal Medicine

## 2013-10-09 VITALS — BP 128/56 | HR 75 | Temp 98.1°F | Resp 16 | Ht 61.75 in | Wt 186.0 lb

## 2013-10-09 DIAGNOSIS — Z124 Encounter for screening for malignant neoplasm of cervix: Secondary | ICD-10-CM

## 2013-10-09 DIAGNOSIS — D631 Anemia in chronic kidney disease: Secondary | ICD-10-CM | POA: Diagnosis not present

## 2013-10-09 DIAGNOSIS — Z940 Kidney transplant status: Secondary | ICD-10-CM | POA: Diagnosis not present

## 2013-10-09 DIAGNOSIS — M25569 Pain in unspecified knee: Secondary | ICD-10-CM

## 2013-10-09 DIAGNOSIS — G4733 Obstructive sleep apnea (adult) (pediatric): Secondary | ICD-10-CM

## 2013-10-09 DIAGNOSIS — D473 Essential (hemorrhagic) thrombocythemia: Secondary | ICD-10-CM

## 2013-10-09 DIAGNOSIS — Q279 Congenital malformation of peripheral vascular system, unspecified: Secondary | ICD-10-CM

## 2013-10-09 DIAGNOSIS — Z1151 Encounter for screening for human papillomavirus (HPV): Secondary | ICD-10-CM | POA: Insufficient documentation

## 2013-10-09 DIAGNOSIS — Z79899 Other long term (current) drug therapy: Secondary | ICD-10-CM | POA: Diagnosis not present

## 2013-10-09 DIAGNOSIS — Q2730 Arteriovenous malformation, site unspecified: Secondary | ICD-10-CM

## 2013-10-09 DIAGNOSIS — N039 Chronic nephritic syndrome with unspecified morphologic changes: Secondary | ICD-10-CM

## 2013-10-09 DIAGNOSIS — N189 Chronic kidney disease, unspecified: Secondary | ICD-10-CM

## 2013-10-09 DIAGNOSIS — M109 Gout, unspecified: Secondary | ICD-10-CM

## 2013-10-09 MED ORDER — ZOLPIDEM TARTRATE 5 MG PO TABS: 5.0000 mg | ORAL_TABLET | Freq: Every evening | ORAL | Status: DC | PRN

## 2013-10-09 NOTE — Progress Notes (Signed)
Pre-visit discussion using our clinic review tool. No additional management support is needed unless otherwise documented below in the visit note.  

## 2013-10-12 ENCOUNTER — Encounter: Payer: Self-pay | Admitting: Internal Medicine

## 2013-10-14 ENCOUNTER — Encounter: Payer: Self-pay | Admitting: Internal Medicine

## 2013-10-14 DIAGNOSIS — M25569 Pain in unspecified knee: Secondary | ICD-10-CM | POA: Insufficient documentation

## 2013-10-14 NOTE — Assessment & Plan Note (Signed)
Using CPAP.  Stable.

## 2013-10-14 NOTE — Assessment & Plan Note (Signed)
Followed by Dr Ma Hillock.  He is following counts.

## 2013-10-14 NOTE — Assessment & Plan Note (Signed)
S/p transplant and doing well.  Being followed closely at [facility].  See their notes for details.

## 2013-10-14 NOTE — Assessment & Plan Note (Signed)
No recent attacks On allopurinol 

## 2013-10-14 NOTE — Assessment & Plan Note (Signed)
Hgb doing well.  Normal with last check.  Follow.

## 2013-10-14 NOTE — Assessment & Plan Note (Signed)
Found in the small bowel.  Stable.  Last hgb normal.  Follow.

## 2013-10-14 NOTE — Progress Notes (Signed)
Subjective:    Patient ID: Diane Guerra, female    DOB: Mar 18, 1951, 63 y.o.   MRN: [id number]  HPI 63 year old female with past history of chronic kidney disease who was on hemodialysis and then transitioned over to peritoneal dialysis.  She subsequently had a kidney transplant.   Has done well since her transplant.  She also is followed by Dr Jeanne Ivan for previous issues with pulmonary edema.  Doing better now from a pulmonary standpoint.  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  She comes in today to follow up on these issues as well as for a complete physical exam.   States Dr Ma Hillock sees her every six months.  Counts have been stable.  She is exercising.  Riding her bike.  She also has sleep apnea and utilizes CPAP.  Stable.  Overall she appears to be doing better and feels better.  She does have some issues with her knee.  Flares intermittently.  Takes glucosamine/chondroiton.  Needs ambient prn to help her sleep.  Does not use regularly.     Past Medical History  Diagnosis Date  . Sleep apnea with use of continuous positive airway pressure (CPAP)   . Gout   . Anemia   . Chronic kidney disease 2007    followed by Dr Juanito Doom  . Pulmonary edema 2014  . Kidney failure 2014  . Essential thrombocytosis     Outpatient Encounter Prescriptions as of 10/09/2013  Medication Sig  . allopurinol (ZYLOPRIM) 100 MG tablet Take 100 mg by mouth 2 (two) times daily.   Marland Kitchen amLODipine (NORVASC) 10 MG tablet Take 10 mg by mouth daily.  Marland Kitchen aspirin 81 MG tablet Take 81 mg by mouth daily.  . carvedilol (COREG) 25 MG tablet Take 25 mg by mouth 2 (two) times daily with a meal.  . cholecalciferol (VITAMIN D) 1000 UNITS tablet Take 1,000 Units by mouth daily.  Marland Kitchen docusate sodium (COLACE) 100 MG capsule Take 100 mg by mouth daily as needed for mild constipation.  . Ferrous Sulfate Dried 140 (45 FE) MG TBCR Take 1 tablet by mouth daily.  . hydroxyurea (HYDREA) 500 MG capsule Take 500 mg  by mouth. 8 capsules per week on Wednesday  . losartan (COZAAR) 25 MG tablet Take 25 mg by mouth daily.  . Misc Natural Products (GLUCOSAMINE CHONDROITIN COMPLX) TABS Take 1 tablet by mouth 2 (two) times daily.  . Multiple Vitamins-Minerals (ONE-A-DAY WOMENS 50 PLUS PO) Take 1 tablet by mouth daily.  . mycophenolate (MYFORTIC) 180 MG EC tablet Take 360 mg by mouth 2 (two) times daily.   . Omega-3 Fatty Acids (FISH OIL) 1000 MG CAPS Take 1 capsule by mouth daily.  . predniSONE (DELTASONE) 10 MG tablet Take 10 mg by mouth as needed.  . sodium bicarbonate 650 MG tablet Take 650 mg by mouth 2 (two) times daily.  . tacrolimus (PROGRAF) 1 MG capsule Take 2 mg by mouth 2 (two) times daily.   . valGANciclovir (VALCYTE) 450 MG tablet Take 450 mg by mouth daily.  Marland Kitchen zolpidem (AMBIEN) 5 MG tablet Take 1 tablet (5 mg total) by mouth at bedtime as needed for sleep.  . [DISCONTINUED] zolpidem (AMBIEN) 5 MG tablet Take 5 mg by mouth at bedtime as needed for sleep.  Marland Kitchen acetaminophen (TYLENOL) 325 MG tablet Take 650 mg by mouth every 6 (six) hours as needed.  Marland Kitchen epoetin alfa (EPOGEN,PROCRIT) 91478 UNIT/ML injection 20,000 Units as needed.  . furosemide (LASIX) 20  MG tablet Take 20 mg by mouth as needed.  . sulfamethoxazole-trimethoprim (BACTRIM,SEPTRA) 400-80 MG per tablet Take 1 tablet by mouth as needed.  . [DISCONTINUED] anagrelide (AGRYLIN) 1 MG capsule Take 1 mg by mouth 2 (two) times daily.  . [DISCONTINUED] calcium carbonate (TUMS EX) 750 MG chewable tablet Chew 1 tablet by mouth daily.  . [DISCONTINUED] Multiple Vitamins-Minerals (RENAL) TABS Take by mouth.  . [DISCONTINUED] Probiotic Product (PROBIOTIC DAILY PO) Take by mouth.  . [DISCONTINUED] Vitamin D, Ergocalciferol, (DRISDOL) 50000 UNITS CAPS Take 50,000 Units by mouth every 30 (thirty) days.    Review of Systems Patient denies any headache, lightheadedness or dizziness.  No significant sinus or allergy symptoms.  No chest pain, tightness or  palpitations.  No increased shortness of breath, cough or congestion. Breathing much improved.  No nausea or vomiting. No acid reflux.  No abdominal pain or cramping. No bowel change, such as diarrhea, constipation, BRBPR or melana.  No urine change.  S/p transplant as outlined.  Feels better.  Doing well.       Objective:   Physical Exam  Filed Vitals:   10/09/13 1147  BP: 128/56  Pulse: 75  Temp: 98.1 F (36.7 C)  Resp: 44   63 year old female in no acute distress.   HEENT:  Nares- clear.  Oropharynx - without lesions. NECK:  Supple.  Nontender.  No audible bruit.  HEART:  Appears to be regular. LUNGS:  No crackles or wheezing audible.  Respirations even and unlabored.  RADIAL PULSE:  Equal bilaterally.    BREASTS:  No nipple discharge or nipple retraction present.  Could not appreciate any distinct nodules or axillary adenopathy.  ABDOMEN:  Soft, nontender.  Bowel sounds present and normal.  No audible abdominal bruit.  GU:  Normal external genitalia.  Vaginal vault without lesions.  Cervix identified.  Pap performed. Could not appreciate any adnexal masses or tenderness.   RECTAL:  Heme negative.   EXTREMITIES:  No increased edema present.  DP pulses palpable and equal bilaterally.          Assessment & Plan:  HEALTH MAINTENANCE. Physical today.  Schedule mammogram.   Wants scheduled after mid May.  Colonoscopy four years ago.  Ok.   I spent 40 minutes with the patient and more than 50% of the time was spent in consultation regarding the above.

## 2013-10-14 NOTE — Assessment & Plan Note (Signed)
Intermittent flares.  Will follow.  Notify me if persistent problems.

## 2013-10-18 DIAGNOSIS — Z940 Kidney transplant status: Secondary | ICD-10-CM | POA: Diagnosis not present

## 2013-10-18 DIAGNOSIS — Z79899 Other long term (current) drug therapy: Secondary | ICD-10-CM | POA: Diagnosis not present

## 2013-10-26 DIAGNOSIS — D649 Anemia, unspecified: Secondary | ICD-10-CM | POA: Diagnosis not present

## 2013-10-29 ENCOUNTER — Telehealth: Payer: Self-pay | Admitting: Internal Medicine

## 2013-10-29 ENCOUNTER — Encounter: Payer: Self-pay | Admitting: Internal Medicine

## 2013-10-29 NOTE — Telephone Encounter (Signed)
States she has left two vm asking for results of her lab work that was done on Friday at [facility].

## 2013-10-30 NOTE — Telephone Encounter (Signed)
I never received a voicemail from her. However, we spoke with her around 4:15 & informed her that we have not received her lab results as of yet. Pt also sent a mychart message after 6pm. We do not have them, they were just drawn on Friday. Pt was told all of this during the phone conversation.

## 2013-11-12 DIAGNOSIS — N186 End stage renal disease: Secondary | ICD-10-CM | POA: Diagnosis not present

## 2013-11-12 DIAGNOSIS — R7309 Other abnormal glucose: Secondary | ICD-10-CM | POA: Diagnosis not present

## 2013-11-12 DIAGNOSIS — Z940 Kidney transplant status: Secondary | ICD-10-CM | POA: Diagnosis not present

## 2013-11-12 DIAGNOSIS — Z79899 Other long term (current) drug therapy: Secondary | ICD-10-CM | POA: Diagnosis not present

## 2013-11-20 DIAGNOSIS — H35059 Retinal neovascularization, unspecified, unspecified eye: Secondary | ICD-10-CM | POA: Diagnosis not present

## 2013-12-04 DIAGNOSIS — Z940 Kidney transplant status: Secondary | ICD-10-CM | POA: Diagnosis not present

## 2013-12-04 DIAGNOSIS — Z79899 Other long term (current) drug therapy: Secondary | ICD-10-CM | POA: Diagnosis not present

## 2013-12-11 ENCOUNTER — Encounter: Payer: Self-pay | Admitting: Internal Medicine

## 2013-12-18 DIAGNOSIS — D72819 Decreased white blood cell count, unspecified: Secondary | ICD-10-CM | POA: Diagnosis not present

## 2013-12-18 DIAGNOSIS — D6489 Other specified anemias: Secondary | ICD-10-CM | POA: Diagnosis not present

## 2013-12-18 DIAGNOSIS — Z48298 Encounter for aftercare following other organ transplant: Secondary | ICD-10-CM | POA: Diagnosis not present

## 2013-12-18 DIAGNOSIS — J449 Chronic obstructive pulmonary disease, unspecified: Secondary | ICD-10-CM | POA: Diagnosis not present

## 2013-12-18 DIAGNOSIS — D473 Essential (hemorrhagic) thrombocythemia: Secondary | ICD-10-CM | POA: Diagnosis not present

## 2013-12-18 DIAGNOSIS — Z5181 Encounter for therapeutic drug level monitoring: Secondary | ICD-10-CM | POA: Diagnosis not present

## 2013-12-18 DIAGNOSIS — Z940 Kidney transplant status: Secondary | ICD-10-CM | POA: Diagnosis not present

## 2013-12-18 DIAGNOSIS — Z09 Encounter for follow-up examination after completed treatment for conditions other than malignant neoplasm: Secondary | ICD-10-CM | POA: Diagnosis not present

## 2013-12-18 DIAGNOSIS — R809 Proteinuria, unspecified: Secondary | ICD-10-CM | POA: Diagnosis not present

## 2013-12-18 DIAGNOSIS — I10 Essential (primary) hypertension: Secondary | ICD-10-CM | POA: Diagnosis not present

## 2013-12-18 DIAGNOSIS — R197 Diarrhea, unspecified: Secondary | ICD-10-CM | POA: Diagnosis not present

## 2013-12-18 DIAGNOSIS — Z79899 Other long term (current) drug therapy: Secondary | ICD-10-CM | POA: Diagnosis not present

## 2013-12-18 DIAGNOSIS — I509 Heart failure, unspecified: Secondary | ICD-10-CM | POA: Diagnosis not present

## 2014-01-01 DIAGNOSIS — Z79899 Other long term (current) drug therapy: Secondary | ICD-10-CM | POA: Diagnosis not present

## 2014-01-01 DIAGNOSIS — Z940 Kidney transplant status: Secondary | ICD-10-CM | POA: Diagnosis not present

## 2014-01-13 DIAGNOSIS — R059 Cough, unspecified: Secondary | ICD-10-CM | POA: Diagnosis not present

## 2014-01-13 DIAGNOSIS — A0811 Acute gastroenteropathy due to Norwalk agent: Secondary | ICD-10-CM | POA: Diagnosis not present

## 2014-01-13 DIAGNOSIS — G4733 Obstructive sleep apnea (adult) (pediatric): Secondary | ICD-10-CM | POA: Diagnosis present

## 2014-01-13 DIAGNOSIS — I5032 Chronic diastolic (congestive) heart failure: Secondary | ICD-10-CM | POA: Diagnosis not present

## 2014-01-13 DIAGNOSIS — R05 Cough: Secondary | ICD-10-CM | POA: Diagnosis not present

## 2014-01-13 DIAGNOSIS — E876 Hypokalemia: Secondary | ICD-10-CM | POA: Diagnosis present

## 2014-01-13 DIAGNOSIS — I509 Heart failure, unspecified: Secondary | ICD-10-CM | POA: Diagnosis not present

## 2014-01-13 DIAGNOSIS — J9600 Acute respiratory failure, unspecified whether with hypoxia or hypercapnia: Secondary | ICD-10-CM | POA: Diagnosis not present

## 2014-01-13 DIAGNOSIS — R6510 Systemic inflammatory response syndrome (SIRS) of non-infectious origin without acute organ dysfunction: Secondary | ICD-10-CM | POA: Diagnosis not present

## 2014-01-13 DIAGNOSIS — D509 Iron deficiency anemia, unspecified: Secondary | ICD-10-CM | POA: Diagnosis present

## 2014-01-13 DIAGNOSIS — Z79899 Other long term (current) drug therapy: Secondary | ICD-10-CM | POA: Diagnosis not present

## 2014-01-13 DIAGNOSIS — I10 Essential (primary) hypertension: Secondary | ICD-10-CM | POA: Diagnosis present

## 2014-01-13 DIAGNOSIS — R509 Fever, unspecified: Secondary | ICD-10-CM | POA: Diagnosis not present

## 2014-01-13 DIAGNOSIS — Z7982 Long term (current) use of aspirin: Secondary | ICD-10-CM | POA: Diagnosis not present

## 2014-01-13 DIAGNOSIS — R918 Other nonspecific abnormal finding of lung field: Secondary | ICD-10-CM | POA: Diagnosis not present

## 2014-01-13 DIAGNOSIS — IMO0002 Reserved for concepts with insufficient information to code with codable children: Secondary | ICD-10-CM | POA: Diagnosis not present

## 2014-01-13 DIAGNOSIS — Z87891 Personal history of nicotine dependence: Secondary | ICD-10-CM | POA: Diagnosis not present

## 2014-01-13 DIAGNOSIS — B9789 Other viral agents as the cause of diseases classified elsewhere: Secondary | ICD-10-CM | POA: Diagnosis present

## 2014-01-13 DIAGNOSIS — Z940 Kidney transplant status: Secondary | ICD-10-CM | POA: Diagnosis not present

## 2014-01-13 DIAGNOSIS — J3489 Other specified disorders of nose and nasal sinuses: Secondary | ICD-10-CM | POA: Diagnosis not present

## 2014-01-13 DIAGNOSIS — D473 Essential (hemorrhagic) thrombocythemia: Secondary | ICD-10-CM | POA: Diagnosis not present

## 2014-01-15 ENCOUNTER — Telehealth: Payer: Self-pay | Admitting: Internal Medicine

## 2014-01-15 ENCOUNTER — Other Ambulatory Visit: Payer: Self-pay | Admitting: Internal Medicine

## 2014-01-15 NOTE — Telephone Encounter (Signed)
Pt was released from the hospital today for a resp. infection and called to make a hospital follow up. Pt has an appt on 7/31. Please advise if she needs a sooner appt and if so where to add her to the schedule. msn

## 2014-01-15 NOTE — Telephone Encounter (Signed)
I can access some information (labs, xrays,etc) but no discharge summary - in care everywhere.  Please call and confirm pt doing ok.  She is seeing multiple physicians.  She recently had a kidney transplant.  If she is seeing one of them in f/u and is doing ok, I am ok for scheduled appt 7/31.  If needs something more, let me know.

## 2014-01-17 DIAGNOSIS — E559 Vitamin D deficiency, unspecified: Secondary | ICD-10-CM | POA: Diagnosis not present

## 2014-01-17 DIAGNOSIS — Z940 Kidney transplant status: Secondary | ICD-10-CM | POA: Diagnosis not present

## 2014-01-17 DIAGNOSIS — D631 Anemia in chronic kidney disease: Secondary | ICD-10-CM | POA: Diagnosis not present

## 2014-01-17 DIAGNOSIS — Z09 Encounter for follow-up examination after completed treatment for conditions other than malignant neoplasm: Secondary | ICD-10-CM | POA: Diagnosis not present

## 2014-01-17 DIAGNOSIS — T8610 Unspecified complication of kidney transplant: Secondary | ICD-10-CM | POA: Diagnosis not present

## 2014-01-17 DIAGNOSIS — Z79899 Other long term (current) drug therapy: Secondary | ICD-10-CM | POA: Diagnosis not present

## 2014-01-18 ENCOUNTER — Encounter: Payer: Self-pay | Admitting: Internal Medicine

## 2014-01-18 NOTE — Telephone Encounter (Signed)
Noted  

## 2014-01-18 NOTE — Telephone Encounter (Signed)
Pt states that she is getting better-still has congestion & temp is staying below 100. Took last abx today. Has a f/u appt scheduled for Tuesday.

## 2014-01-22 ENCOUNTER — Ambulatory Visit (INDEPENDENT_AMBULATORY_CARE_PROVIDER_SITE_OTHER): Payer: Medicare Other | Admitting: Internal Medicine

## 2014-01-22 ENCOUNTER — Encounter: Payer: Self-pay | Admitting: Internal Medicine

## 2014-01-22 VITALS — BP 120/60 | HR 76 | Temp 98.5°F | Ht 61.75 in | Wt 188.8 lb

## 2014-01-22 DIAGNOSIS — N039 Chronic nephritic syndrome with unspecified morphologic changes: Secondary | ICD-10-CM | POA: Diagnosis not present

## 2014-01-22 DIAGNOSIS — J069 Acute upper respiratory infection, unspecified: Secondary | ICD-10-CM

## 2014-01-22 DIAGNOSIS — D631 Anemia in chronic kidney disease: Secondary | ICD-10-CM

## 2014-01-22 DIAGNOSIS — N189 Chronic kidney disease, unspecified: Secondary | ICD-10-CM

## 2014-01-22 NOTE — Progress Notes (Signed)
Pre visit review using our clinic review tool, if applicable. No additional management support is needed unless otherwise documented below in the visit note. 

## 2014-01-26 ENCOUNTER — Encounter: Payer: Self-pay | Admitting: Internal Medicine

## 2014-01-26 DIAGNOSIS — J069 Acute upper respiratory infection, unspecified: Secondary | ICD-10-CM | POA: Insufficient documentation

## 2014-01-26 NOTE — Assessment & Plan Note (Addendum)
S/p transplant and doing well.  Being followed closely at [facility].  See their notes for details.  Has f/u planned at the beginning of August.  Check metabolic panel.

## 2014-01-26 NOTE — Assessment & Plan Note (Signed)
Recheck cbc to confirm stable.  

## 2014-01-26 NOTE — Assessment & Plan Note (Signed)
Cough and congestion better.  Treated with levaquin.  Feeling better.  Energy improving.  Follow.

## 2014-01-26 NOTE — Progress Notes (Signed)
Subjective:    Patient ID: Diane Guerra, female    DOB: 1950/08/09, 63 y.o.   MRN: [id number]  HPI 63 year old female with past history of chronic kidney disease who was on hemodialysis and then transitioned over to peritoneal dialysis.  She subsequently had a kidney transplant.   She overall has done well since her transplant.  She is followed by Dr Jeanne Ivan.  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  She comes in today for a hospital follow up.  Had been doing well.  Has been at the beach.  Recently developed some congestion, cough and drainage.  Had fever.  Was admitted.  Placed on broad spectrum abx.  Discharged on levaquin.  While on the medication, joints started hurting.  Since being off, feels better.  Cough is better.  Congestion better.  Energy improving.  Ambulating better now.  Still with decreased energy.  Has been more constipated.  Taking colace now.  Had a bowel movement yesterday and today.     Past Medical History  Diagnosis Date  . Sleep apnea with use of continuous positive airway pressure (CPAP)   . Gout   . Anemia   . Chronic kidney disease 2007    followed by Dr Juanito Doom  . Pulmonary edema 2014  . Kidney failure 2014  . Essential thrombocytosis     Outpatient Encounter Prescriptions as of 01/22/2014  Medication Sig  . acetaminophen (TYLENOL) 325 MG tablet Take 650 mg by mouth every 6 (six) hours as needed.  Marland Kitchen allopurinol (ZYLOPRIM) 100 MG tablet Take 100 mg by mouth 2 (two) times daily.   Marland Kitchen amLODipine (NORVASC) 10 MG tablet Take 10 mg by mouth daily.  Marland Kitchen aspirin 81 MG tablet Take 81 mg by mouth daily.  . carvedilol (COREG) 25 MG tablet Take 25 mg by mouth 2 (two) times daily with a meal.  . cholecalciferol (VITAMIN D) 1000 UNITS tablet Take 1,000 Units by mouth daily.  Marland Kitchen docusate sodium (COLACE) 100 MG capsule Take 100 mg by mouth daily as needed for mild constipation.  Marland Kitchen epoetin alfa (EPOGEN,PROCRIT) 60454 UNIT/ML injection 20,000 Units  as needed.  . Ferrous Sulfate Dried 140 (45 FE) MG TBCR Take 1 tablet by mouth daily.  . furosemide (LASIX) 20 MG tablet Take 20 mg by mouth as needed.  . hydroxyurea (HYDREA) 500 MG capsule Take 500 mg by mouth. 8 capsules per week on Wednesday  . losartan (COZAAR) 25 MG tablet Take 25 mg by mouth daily.  . Misc Natural Products (GLUCOSAMINE CHONDROITIN COMPLX) TABS Take 1 tablet by mouth 2 (two) times daily.  . Multiple Vitamins-Minerals (ONE-A-DAY WOMENS 50 PLUS PO) Take 1 tablet by mouth daily.  . mycophenolate (MYFORTIC) 180 MG EC tablet Take 180 mg by mouth 2 (two) times daily.   . Omega-3 Fatty Acids (FISH OIL) 1000 MG CAPS Take 1 capsule by mouth daily.  . predniSONE (DELTASONE) 10 MG tablet Take 10 mg by mouth as needed.  . sodium bicarbonate 650 MG tablet Take 650 mg by mouth 2 (two) times daily.  . tacrolimus (PROGRAF) 1 MG capsule Take by mouth. 1mg  in the morning, 0.5mg  at 9pm  . zolpidem (AMBIEN) 5 MG tablet Take 1 tablet (5 mg total) by mouth at bedtime as needed for sleep.  . [DISCONTINUED] sulfamethoxazole-trimethoprim (BACTRIM,SEPTRA) 400-80 MG per tablet Take 1 tablet by mouth as needed.  . [DISCONTINUED] valGANciclovir (VALCYTE) 450 MG tablet Take 450 mg by mouth daily.  Review of Systems Patient denies any headache, lightheadedness or dizziness.  No significant sinus or allergy symptoms.  No chest pain, tightness or palpitations.  No increased shortness of breath.  Cough and congestion better.  Finished abx.  Joint aching better.   Breathing much improved.  No nausea or vomiting. No acid reflux.  No abdominal pain or cramping. No bowel change, such as diarrhea, constipation, BRBPR or melana.  No urine change.  S/p transplant as outlined.       Objective:   Physical Exam  Filed Vitals:   01/22/14 1342  BP: 120/60  Pulse: 76  Temp: 98.5 F (36.9 C)   Blood pressure recheck:  54/54  63 year old female in no acute distress.   HEENT:  Nares- clear.  Oropharynx -  without lesions. NECK:  Supple.  Nontender.  No audible bruit.  HEART:  Appears to be regular. LUNGS:  No crackles or wheezing audible.  Respirations even and unlabored.  RADIAL PULSE:  Equal bilaterally.   ABDOMEN:  Soft, nontender.  Bowel sounds present and normal.  No audible abdominal bruit.   EXTREMITIES:  No increased edema present.  DP pulses palpable and equal bilaterally.          Assessment & Plan:  HEALTH MAINTENANCE. Physical last visit.  Needs mammogram.  Colonoscopy four years ago.  Ok.   I spent 25 minutes with the patient and more than 50% of the time was spent in consultation regarding the above.

## 2014-01-29 DIAGNOSIS — N289 Disorder of kidney and ureter, unspecified: Secondary | ICD-10-CM | POA: Diagnosis not present

## 2014-01-29 DIAGNOSIS — M109 Gout, unspecified: Secondary | ICD-10-CM | POA: Diagnosis not present

## 2014-01-29 DIAGNOSIS — D649 Anemia, unspecified: Secondary | ICD-10-CM | POA: Diagnosis not present

## 2014-01-30 ENCOUNTER — Telehealth: Payer: Self-pay | Admitting: *Deleted

## 2014-01-30 NOTE — Telephone Encounter (Signed)
Pt left VM, requesting lab results. Nothing in Epic. Called pt, states she had labs drawn at [place] yesterday. Advised the labs were received today and given to Dr. Nicki Reaper for review. Advised we would call once Dr. Nicki Reaper has reviewed.

## 2014-01-31 NOTE — Telephone Encounter (Addendum)
Pt left VM, asking for lab results

## 2014-02-01 NOTE — Telephone Encounter (Signed)
Pt notified of lab results via my chart.  Continues to be followed at [facility].

## 2014-02-08 ENCOUNTER — Ambulatory Visit: Payer: Medicare Other | Admitting: Internal Medicine

## 2014-02-12 DIAGNOSIS — D649 Anemia, unspecified: Secondary | ICD-10-CM | POA: Diagnosis not present

## 2014-02-12 DIAGNOSIS — I509 Heart failure, unspecified: Secondary | ICD-10-CM | POA: Diagnosis not present

## 2014-02-12 DIAGNOSIS — Z48298 Encounter for aftercare following other organ transplant: Secondary | ICD-10-CM | POA: Diagnosis not present

## 2014-02-12 DIAGNOSIS — D6489 Other specified anemias: Secondary | ICD-10-CM | POA: Diagnosis not present

## 2014-02-12 DIAGNOSIS — Z940 Kidney transplant status: Secondary | ICD-10-CM | POA: Diagnosis not present

## 2014-02-12 DIAGNOSIS — Z79899 Other long term (current) drug therapy: Secondary | ICD-10-CM | POA: Diagnosis not present

## 2014-02-12 DIAGNOSIS — J449 Chronic obstructive pulmonary disease, unspecified: Secondary | ICD-10-CM | POA: Diagnosis not present

## 2014-02-12 DIAGNOSIS — D473 Essential (hemorrhagic) thrombocythemia: Secondary | ICD-10-CM | POA: Diagnosis not present

## 2014-02-12 DIAGNOSIS — I10 Essential (primary) hypertension: Secondary | ICD-10-CM | POA: Diagnosis not present

## 2014-02-12 DIAGNOSIS — Z09 Encounter for follow-up examination after completed treatment for conditions other than malignant neoplasm: Secondary | ICD-10-CM | POA: Diagnosis not present

## 2014-02-12 DIAGNOSIS — M109 Gout, unspecified: Secondary | ICD-10-CM | POA: Diagnosis not present

## 2014-02-26 ENCOUNTER — Telehealth: Payer: Self-pay | Admitting: *Deleted

## 2014-02-26 DIAGNOSIS — D649 Anemia, unspecified: Secondary | ICD-10-CM | POA: Diagnosis not present

## 2014-02-26 NOTE — Telephone Encounter (Signed)
Pt wants to know if you would add a [place] (sp?) to the labs drawn at [place] drawing station this morning. Please advise. The doctor that usually orders it is out of town & she usually has a standing order.

## 2014-02-26 NOTE — Telephone Encounter (Signed)
Pt notified and verbalized understanding.

## 2014-02-26 NOTE — Telephone Encounter (Signed)
Given type of lab, her history, etc - she needs to get order from kidney doctor office.

## 2014-03-04 ENCOUNTER — Telehealth: Payer: Self-pay | Admitting: Internal Medicine

## 2014-03-04 NOTE — Telephone Encounter (Signed)
Pt notified hgb stable - 11.5 via my chart.

## 2014-03-05 DIAGNOSIS — D473 Essential (hemorrhagic) thrombocythemia: Secondary | ICD-10-CM | POA: Diagnosis not present

## 2014-03-05 DIAGNOSIS — B029 Zoster without complications: Secondary | ICD-10-CM | POA: Diagnosis not present

## 2014-03-06 NOTE — Telephone Encounter (Signed)
Unread mychart message mailed to patient 

## 2014-03-12 ENCOUNTER — Encounter: Payer: Self-pay | Admitting: Internal Medicine

## 2014-03-12 DIAGNOSIS — T8610 Unspecified complication of kidney transplant: Secondary | ICD-10-CM | POA: Diagnosis not present

## 2014-03-12 DIAGNOSIS — Z09 Encounter for follow-up examination after completed treatment for conditions other than malignant neoplasm: Secondary | ICD-10-CM | POA: Diagnosis not present

## 2014-03-12 DIAGNOSIS — Z1159 Encounter for screening for other viral diseases: Secondary | ICD-10-CM | POA: Diagnosis not present

## 2014-03-12 DIAGNOSIS — Z940 Kidney transplant status: Secondary | ICD-10-CM | POA: Diagnosis not present

## 2014-03-12 DIAGNOSIS — D631 Anemia in chronic kidney disease: Secondary | ICD-10-CM | POA: Diagnosis not present

## 2014-03-12 DIAGNOSIS — Z79899 Other long term (current) drug therapy: Secondary | ICD-10-CM | POA: Diagnosis not present

## 2014-03-12 DIAGNOSIS — E559 Vitamin D deficiency, unspecified: Secondary | ICD-10-CM | POA: Diagnosis not present

## 2014-03-12 DIAGNOSIS — N039 Chronic nephritic syndrome with unspecified morphologic changes: Secondary | ICD-10-CM | POA: Diagnosis not present

## 2014-03-21 ENCOUNTER — Encounter: Payer: Self-pay | Admitting: Internal Medicine

## 2014-04-09 ENCOUNTER — Encounter: Payer: Self-pay | Admitting: Internal Medicine

## 2014-04-09 ENCOUNTER — Ambulatory Visit (INDEPENDENT_AMBULATORY_CARE_PROVIDER_SITE_OTHER): Payer: Medicare Other | Admitting: Internal Medicine

## 2014-04-09 VITALS — BP 130/60 | HR 73 | Temp 98.3°F | Ht 61.75 in | Wt 195.2 lb

## 2014-04-09 DIAGNOSIS — N039 Chronic nephritic syndrome with unspecified morphologic changes: Secondary | ICD-10-CM

## 2014-04-09 DIAGNOSIS — D631 Anemia in chronic kidney disease: Secondary | ICD-10-CM

## 2014-04-09 DIAGNOSIS — M79676 Pain in unspecified toe(s): Secondary | ICD-10-CM

## 2014-04-09 DIAGNOSIS — N189 Chronic kidney disease, unspecified: Secondary | ICD-10-CM | POA: Diagnosis not present

## 2014-04-09 DIAGNOSIS — G4733 Obstructive sleep apnea (adult) (pediatric): Secondary | ICD-10-CM

## 2014-04-09 DIAGNOSIS — D473 Essential (hemorrhagic) thrombocythemia: Secondary | ICD-10-CM | POA: Diagnosis not present

## 2014-04-09 DIAGNOSIS — M109 Gout, unspecified: Secondary | ICD-10-CM

## 2014-04-09 DIAGNOSIS — J811 Chronic pulmonary edema: Secondary | ICD-10-CM

## 2014-04-09 DIAGNOSIS — M79609 Pain in unspecified limb: Secondary | ICD-10-CM

## 2014-04-09 NOTE — Progress Notes (Signed)
Pre visit review using our clinic review tool, if applicable. No additional management support is needed unless otherwise documented below in the visit note. 

## 2014-04-10 ENCOUNTER — Telehealth: Payer: Self-pay | Admitting: Internal Medicine

## 2014-04-10 ENCOUNTER — Encounter: Payer: Self-pay | Admitting: Internal Medicine

## 2014-04-10 DIAGNOSIS — M79676 Pain in unspecified toe(s): Secondary | ICD-10-CM | POA: Insufficient documentation

## 2014-04-10 DIAGNOSIS — Z1239 Encounter for other screening for malignant neoplasm of breast: Secondary | ICD-10-CM

## 2014-04-10 NOTE — Progress Notes (Signed)
Subjective:    Patient ID: Diane Guerra, female    DOB: 02/20/1951, 63 y.o.   MRN: [id number]  HPI 63 year old female with past history of chronic kidney disease who was on hemodialysis and then transitioned over to peritoneal dialysis.  She subsequently had a kidney transplant.   She overall has done well since her transplant.  She is followed by Dr Jeanne Ivan.  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  She comes in today for a scheduled follow up.   Energy improving.  Not exercising.  Plans to get more active.  No chest pain or tightness.  No sob.  Eating and drinking well.  No nausea or vomiting.  Recently had shingles.  Better.  She is accompanied by her husband.  history obtained from both of them.  She did hit her right fifth toe on a stool in her bathroom.  Bruised.  Some pain.  Able to walk without significant difficulty.  Has f/u with her kidney physician 04/18/14.      Past Medical History  Diagnosis Date  . Sleep apnea with use of continuous positive airway pressure (CPAP)   . Gout   . Anemia   . Chronic kidney disease 2007    followed by Dr Juanito Doom  . Pulmonary edema 2014  . Kidney failure 2014  . Essential thrombocytosis     Outpatient Encounter Prescriptions as of 04/09/2014  Medication Sig  . acetaminophen (TYLENOL) 325 MG tablet Take 650 mg by mouth every 6 (six) hours as needed.  Marland Kitchen allopurinol (ZYLOPRIM) 100 MG tablet Take 100 mg by mouth 2 (two) times daily.   Marland Kitchen amLODipine (NORVASC) 10 MG tablet Take 10 mg by mouth daily.  Marland Kitchen aspirin 81 MG tablet Take 81 mg by mouth daily.  . carvedilol (COREG) 25 MG tablet Take 25 mg by mouth 2 (two) times daily with a meal.  . cholecalciferol (VITAMIN D) 1000 UNITS tablet Take 1,000 Units by mouth daily.  Marland Kitchen docusate sodium (COLACE) 100 MG capsule Take 100 mg by mouth daily as needed for mild constipation.  Marland Kitchen epoetin alfa (EPOGEN,PROCRIT) 60454 UNIT/ML injection 20,000 Units as needed.  . Ferrous Sulfate  Dried 140 (45 FE) MG TBCR Take 1 tablet by mouth daily.  . furosemide (LASIX) 20 MG tablet Take 20 mg by mouth as needed.  . hydroxyurea (HYDREA) 500 MG capsule Take 500 mg by mouth. 8 capsules per week on Wednesday  . losartan (COZAAR) 25 MG tablet Take 25 mg by mouth daily.  . Misc Natural Products (GLUCOSAMINE CHONDROITIN COMPLX) TABS Take 1 tablet by mouth 2 (two) times daily.  . Omega-3 Fatty Acids (FISH OIL) 1000 MG CAPS Take 1 capsule by mouth daily.  . predniSONE (DELTASONE) 10 MG tablet Take 10 mg by mouth as needed.  . sodium bicarbonate 650 MG tablet Take 650 mg by mouth 2 (two) times daily.  . tacrolimus (PROGRAF) 1 MG capsule Take 2.5 mg by mouth. 1.5mg  in the morning, 1mg  at 9pm  . zolpidem (AMBIEN) 5 MG tablet Take 1 tablet (5 mg total) by mouth at bedtime as needed for sleep.  . [DISCONTINUED] Multiple Vitamins-Minerals (ONE-A-DAY WOMENS 50 PLUS PO) Take 1 tablet by mouth daily.    Review of Systems Patient denies any headache, lightheadedness or dizziness.  No significant sinus or allergy symptoms.  No chest pain, tightness or palpitations.  No increased shortness of breath.  No cough or congestion.  Breathing stable.  No nausea or  vomiting. No acid reflux.  No abdominal pain or cramping. No bowel change, such as diarrhea, constipation, BRBPR or melana.  No urine change.  S/p transplant as outlined.  Right fifth toe pain as outlined.  S/p injury.       Objective:   Physical Exam  Filed Vitals:   04/09/14 1554  BP: 130/60  Pulse: 73  Temp: 98.3 F (36.8 C)   Blood pressure recheck:  22/40  63 year old female in no acute distress.   HEENT:  Nares- clear.  Oropharynx - without lesions. NECK:  Supple.  Nontender.  No audible bruit.  HEART:  Appears to be regular. LUNGS:  No crackles or wheezing audible.  Respirations even and unlabored.  RADIAL PULSE:  Equal bilaterally.   ABDOMEN:  Soft, nontender.  Bowel sounds present and normal.  No audible abdominal bruit.    EXTREMITIES:  No increased edema present.  DP pulses palpable and equal bilaterally.  MSK:  Increased pain right fifth toe.  Bruised.  No pain fifth metatarsal.           Assessment & Plan:  HEALTH MAINTENANCE. Physical 10/09/13.   Needs mammogram.  Colonoscopy four years ago.  Ok.   I spent 25 minutes with the patient and more than 50% of the time was spent in consultation regarding the above.

## 2014-04-10 NOTE — Assessment & Plan Note (Signed)
Using CPAP.  Stable.

## 2014-04-10 NOTE — Assessment & Plan Note (Signed)
Breathing better.  Followed by Dr Jeanne Ivan.  Off oxygen.  Exercising.

## 2014-04-10 NOTE — Assessment & Plan Note (Signed)
Followed by Dr Ma Hillock.  He is following counts.

## 2014-04-10 NOTE — Assessment & Plan Note (Signed)
No recent attacks On allopurinol 

## 2014-04-10 NOTE — Telephone Encounter (Signed)
Notify pt that I would like to schedule her for a mammogram.  If agreeable, let me know and I will place the order for the referral.

## 2014-04-10 NOTE — Assessment & Plan Note (Signed)
Due to have cbc rechecked 04/18/14.  Has been better.

## 2014-04-10 NOTE — Assessment & Plan Note (Signed)
S/p injury.  Discussed possible fracture.  She declines xray.  Post op shoe.  Follow.

## 2014-04-10 NOTE — Assessment & Plan Note (Signed)
S/p transplant and doing well.  Being followed closely at [facility].  See their notes for details.  Has f/u planned at the beginning of October.

## 2014-04-11 NOTE — Telephone Encounter (Signed)
Pt agreed to have mammogram

## 2014-04-12 NOTE — Telephone Encounter (Signed)
Order placed for mammogram.

## 2014-04-16 DIAGNOSIS — I10 Essential (primary) hypertension: Secondary | ICD-10-CM | POA: Diagnosis not present

## 2014-04-16 DIAGNOSIS — K8020 Calculus of gallbladder without cholecystitis without obstruction: Secondary | ICD-10-CM | POA: Diagnosis not present

## 2014-04-16 DIAGNOSIS — D473 Essential (hemorrhagic) thrombocythemia: Secondary | ICD-10-CM | POA: Diagnosis not present

## 2014-04-16 DIAGNOSIS — K8080 Other cholelithiasis without obstruction: Secondary | ICD-10-CM | POA: Diagnosis not present

## 2014-04-16 DIAGNOSIS — Z940 Kidney transplant status: Secondary | ICD-10-CM | POA: Diagnosis not present

## 2014-04-16 DIAGNOSIS — Z79899 Other long term (current) drug therapy: Secondary | ICD-10-CM | POA: Diagnosis not present

## 2014-04-16 DIAGNOSIS — Z23 Encounter for immunization: Secondary | ICD-10-CM | POA: Diagnosis not present

## 2014-04-16 DIAGNOSIS — I5020 Unspecified systolic (congestive) heart failure: Secondary | ICD-10-CM | POA: Diagnosis not present

## 2014-04-16 DIAGNOSIS — Z09 Encounter for follow-up examination after completed treatment for conditions other than malignant neoplasm: Secondary | ICD-10-CM | POA: Diagnosis not present

## 2014-04-16 DIAGNOSIS — N289 Disorder of kidney and ureter, unspecified: Secondary | ICD-10-CM | POA: Diagnosis not present

## 2014-04-16 DIAGNOSIS — N281 Cyst of kidney, acquired: Secondary | ICD-10-CM | POA: Diagnosis not present

## 2014-04-16 DIAGNOSIS — Z114 Encounter for screening for human immunodeficiency virus [HIV]: Secondary | ICD-10-CM | POA: Diagnosis not present

## 2014-04-16 DIAGNOSIS — Z4822 Encounter for aftercare following kidney transplant: Secondary | ICD-10-CM | POA: Diagnosis not present

## 2014-04-16 DIAGNOSIS — J449 Chronic obstructive pulmonary disease, unspecified: Secondary | ICD-10-CM | POA: Diagnosis not present

## 2014-05-01 DIAGNOSIS — Z79899 Other long term (current) drug therapy: Secondary | ICD-10-CM | POA: Diagnosis not present

## 2014-05-01 DIAGNOSIS — E559 Vitamin D deficiency, unspecified: Secondary | ICD-10-CM | POA: Diagnosis not present

## 2014-05-01 DIAGNOSIS — Z9483 Pancreas transplant status: Secondary | ICD-10-CM | POA: Diagnosis not present

## 2014-05-01 DIAGNOSIS — D631 Anemia in chronic kidney disease: Secondary | ICD-10-CM | POA: Diagnosis not present

## 2014-05-01 DIAGNOSIS — Z09 Encounter for follow-up examination after completed treatment for conditions other than malignant neoplasm: Secondary | ICD-10-CM | POA: Diagnosis not present

## 2014-05-01 DIAGNOSIS — T8610 Unspecified complication of kidney transplant: Secondary | ICD-10-CM | POA: Diagnosis not present

## 2014-05-01 DIAGNOSIS — Z1159 Encounter for screening for other viral diseases: Secondary | ICD-10-CM | POA: Diagnosis not present

## 2014-05-01 DIAGNOSIS — Z940 Kidney transplant status: Secondary | ICD-10-CM | POA: Diagnosis not present

## 2014-05-28 DIAGNOSIS — Z114 Encounter for screening for human immunodeficiency virus [HIV]: Secondary | ICD-10-CM | POA: Diagnosis not present

## 2014-05-28 DIAGNOSIS — Z79899 Other long term (current) drug therapy: Secondary | ICD-10-CM | POA: Diagnosis not present

## 2014-05-28 DIAGNOSIS — T8610 Unspecified complication of kidney transplant: Secondary | ICD-10-CM | POA: Diagnosis not present

## 2014-05-28 DIAGNOSIS — Z789 Other specified health status: Secondary | ICD-10-CM | POA: Diagnosis not present

## 2014-05-28 DIAGNOSIS — Z09 Encounter for follow-up examination after completed treatment for conditions other than malignant neoplasm: Secondary | ICD-10-CM | POA: Diagnosis not present

## 2014-05-28 DIAGNOSIS — E559 Vitamin D deficiency, unspecified: Secondary | ICD-10-CM | POA: Diagnosis not present

## 2014-05-28 DIAGNOSIS — Z940 Kidney transplant status: Secondary | ICD-10-CM | POA: Diagnosis not present

## 2014-07-10 DIAGNOSIS — Z940 Kidney transplant status: Secondary | ICD-10-CM | POA: Diagnosis not present

## 2014-07-10 DIAGNOSIS — Z09 Encounter for follow-up examination after completed treatment for conditions other than malignant neoplasm: Secondary | ICD-10-CM | POA: Diagnosis not present

## 2014-07-10 DIAGNOSIS — E559 Vitamin D deficiency, unspecified: Secondary | ICD-10-CM | POA: Diagnosis not present

## 2014-07-10 DIAGNOSIS — Z114 Encounter for screening for human immunodeficiency virus [HIV]: Secondary | ICD-10-CM | POA: Diagnosis not present

## 2014-07-10 DIAGNOSIS — Z79899 Other long term (current) drug therapy: Secondary | ICD-10-CM | POA: Diagnosis not present

## 2014-07-10 DIAGNOSIS — N390 Urinary tract infection, site not specified: Secondary | ICD-10-CM | POA: Diagnosis not present

## 2014-07-10 DIAGNOSIS — Z789 Other specified health status: Secondary | ICD-10-CM | POA: Diagnosis not present

## 2014-07-16 ENCOUNTER — Ambulatory Visit: Payer: Medicare Other | Admitting: Internal Medicine

## 2014-07-17 DIAGNOSIS — Z79899 Other long term (current) drug therapy: Secondary | ICD-10-CM | POA: Diagnosis not present

## 2014-07-17 DIAGNOSIS — D473 Essential (hemorrhagic) thrombocythemia: Secondary | ICD-10-CM | POA: Diagnosis not present

## 2014-07-17 DIAGNOSIS — I509 Heart failure, unspecified: Secondary | ICD-10-CM | POA: Diagnosis not present

## 2014-07-17 DIAGNOSIS — Z4822 Encounter for aftercare following kidney transplant: Secondary | ICD-10-CM | POA: Diagnosis not present

## 2014-07-17 DIAGNOSIS — I10 Essential (primary) hypertension: Secondary | ICD-10-CM | POA: Diagnosis not present

## 2014-07-17 DIAGNOSIS — J449 Chronic obstructive pulmonary disease, unspecified: Secondary | ICD-10-CM | POA: Diagnosis not present

## 2014-07-17 DIAGNOSIS — J069 Acute upper respiratory infection, unspecified: Secondary | ICD-10-CM | POA: Diagnosis not present

## 2014-07-17 DIAGNOSIS — Z48298 Encounter for aftercare following other organ transplant: Secondary | ICD-10-CM | POA: Diagnosis not present

## 2014-07-17 DIAGNOSIS — Z940 Kidney transplant status: Secondary | ICD-10-CM | POA: Diagnosis not present

## 2014-08-19 DIAGNOSIS — Z940 Kidney transplant status: Secondary | ICD-10-CM | POA: Diagnosis not present

## 2014-08-19 DIAGNOSIS — Z789 Other specified health status: Secondary | ICD-10-CM | POA: Diagnosis not present

## 2014-08-19 DIAGNOSIS — Z79899 Other long term (current) drug therapy: Secondary | ICD-10-CM | POA: Diagnosis not present

## 2014-08-19 DIAGNOSIS — E559 Vitamin D deficiency, unspecified: Secondary | ICD-10-CM | POA: Diagnosis not present

## 2014-08-19 DIAGNOSIS — D631 Anemia in chronic kidney disease: Secondary | ICD-10-CM | POA: Diagnosis not present

## 2014-08-19 DIAGNOSIS — Z09 Encounter for follow-up examination after completed treatment for conditions other than malignant neoplasm: Secondary | ICD-10-CM | POA: Diagnosis not present

## 2014-08-19 DIAGNOSIS — T8610 Unspecified complication of kidney transplant: Secondary | ICD-10-CM | POA: Diagnosis not present

## 2014-08-27 DIAGNOSIS — Z6837 Body mass index (BMI) 37.0-37.9, adult: Secondary | ICD-10-CM | POA: Diagnosis not present

## 2014-08-27 DIAGNOSIS — D473 Essential (hemorrhagic) thrombocythemia: Secondary | ICD-10-CM | POA: Diagnosis not present

## 2014-08-28 DIAGNOSIS — R52 Pain, unspecified: Secondary | ICD-10-CM | POA: Diagnosis not present

## 2014-08-28 DIAGNOSIS — M170 Bilateral primary osteoarthritis of knee: Secondary | ICD-10-CM | POA: Diagnosis not present

## 2014-08-28 DIAGNOSIS — Z681 Body mass index (BMI) 19 or less, adult: Secondary | ICD-10-CM | POA: Diagnosis not present

## 2014-08-29 ENCOUNTER — Encounter: Payer: Self-pay | Admitting: Internal Medicine

## 2014-08-29 ENCOUNTER — Ambulatory Visit (INDEPENDENT_AMBULATORY_CARE_PROVIDER_SITE_OTHER): Payer: Medicare Other | Admitting: Internal Medicine

## 2014-08-29 VITALS — BP 144/79 | HR 71 | Temp 97.9°F | Ht 61.75 in | Wt 208.2 lb

## 2014-08-29 DIAGNOSIS — N189 Chronic kidney disease, unspecified: Secondary | ICD-10-CM | POA: Diagnosis not present

## 2014-08-29 DIAGNOSIS — Z0000 Encounter for general adult medical examination without abnormal findings: Secondary | ICD-10-CM

## 2014-08-29 DIAGNOSIS — R05 Cough: Secondary | ICD-10-CM

## 2014-08-29 DIAGNOSIS — R600 Localized edema: Secondary | ICD-10-CM

## 2014-08-29 DIAGNOSIS — M25569 Pain in unspecified knee: Secondary | ICD-10-CM | POA: Diagnosis not present

## 2014-08-29 DIAGNOSIS — D631 Anemia in chronic kidney disease: Secondary | ICD-10-CM | POA: Diagnosis not present

## 2014-08-29 DIAGNOSIS — R059 Cough, unspecified: Secondary | ICD-10-CM

## 2014-08-29 DIAGNOSIS — D473 Essential (hemorrhagic) thrombocythemia: Secondary | ICD-10-CM

## 2014-08-29 DIAGNOSIS — G4733 Obstructive sleep apnea (adult) (pediatric): Secondary | ICD-10-CM | POA: Diagnosis not present

## 2014-08-29 MED ORDER — ZOLPIDEM TARTRATE 5 MG PO TABS: 5.0000 mg | ORAL_TABLET | Freq: Every evening | ORAL | Status: DC | PRN

## 2014-08-29 NOTE — Progress Notes (Signed)
Pre visit review using our clinic review tool, if applicable. No additional management support is needed unless otherwise documented below in the visit note. 

## 2014-09-02 ENCOUNTER — Encounter: Payer: Self-pay | Admitting: Internal Medicine

## 2014-09-02 DIAGNOSIS — R600 Localized edema: Secondary | ICD-10-CM | POA: Insufficient documentation

## 2014-09-02 DIAGNOSIS — R05 Cough: Secondary | ICD-10-CM | POA: Insufficient documentation

## 2014-09-02 DIAGNOSIS — R059 Cough, unspecified: Secondary | ICD-10-CM | POA: Insufficient documentation

## 2014-09-02 DIAGNOSIS — Z0000 Encounter for general adult medical examination without abnormal findings: Secondary | ICD-10-CM | POA: Insufficient documentation

## 2014-09-02 NOTE — Assessment & Plan Note (Signed)
Physical 10/09/13.  Schedule mammogram - overdue.  Colonoscopy four years ago.

## 2014-09-02 NOTE — Assessment & Plan Note (Signed)
Saw ortho yesterday.  Knee brace.  Planning for physical therapy.

## 2014-09-02 NOTE — Assessment & Plan Note (Signed)
Last hgb 12.1 - 07/17/14.

## 2014-09-02 NOTE — Assessment & Plan Note (Signed)
Followed by Dr Ma Hillock.  Has been stable.

## 2014-09-02 NOTE — Assessment & Plan Note (Signed)
Better since decreasing the amlodipine to 1/2 dose.  Swelling better.  Follow.

## 2014-09-02 NOTE — Assessment & Plan Note (Signed)
Using CPAP.  Stable.

## 2014-09-02 NOTE — Assessment & Plan Note (Signed)
Persistent.  No evidence of active infection.  No acid reflux.  Refer back to Dr Jeanne Ivan.

## 2014-09-02 NOTE — Assessment & Plan Note (Signed)
S/p transplant and doing well.  Being followed closely at [facility].  They are following her labs.

## 2014-09-02 NOTE — Progress Notes (Signed)
Patient ID: Diane Guerra, female   DOB: [birth year], 64 y.o.   MRN: [id number]   Subjective:    Patient ID: Diane Guerra, female    DOB: 08-15-1950, 64 y.o.   MRN: [id number]  HPI  Patient here for a scheduled follow up.  Recently had sinus infection.  Has improved.  Has a persistent cough.  This has been persistent for a while.  Breathing overall stable.  She has previously seen Dr Jeanne Ivan.  Request referral back.  She is concerned about not losing weight.  Trying to watch her diet.  Is exercising.  Riding her stationary bike.  Saw ortho yesterday.  Still with knee pain.  Was given a brace.  Scheduled to see physical therapy.  Was having some lower extremity swelling.  norvasc was decreased to 1/2 tablet per day.  Swelling has improved.  Bowels stable.     Past Medical History  Diagnosis Date  . Sleep apnea with use of continuous positive airway pressure (CPAP)   . Gout   . Anemia   . Chronic kidney disease 2007    followed by Dr Juanito Doom  . Pulmonary edema 2014  . Kidney failure 2014  . Essential thrombocytosis     Current Outpatient Prescriptions on File Prior to Visit  Medication Sig Dispense Refill  . acetaminophen (TYLENOL) 325 MG tablet Take 650 mg by mouth every 6 (six) hours as needed.    Marland Kitchen allopurinol (ZYLOPRIM) 100 MG tablet Take 100 mg by mouth 2 (two) times daily.     Marland Kitchen aspirin 81 MG tablet Take 81 mg by mouth daily.    . carvedilol (COREG) 25 MG tablet Take 25 mg by mouth 2 (two) times daily with a meal.    . cholecalciferol (VITAMIN D) 1000 UNITS tablet Take 1,000 Units by mouth daily.    Marland Kitchen docusate sodium (COLACE) 100 MG capsule Take 100 mg by mouth daily as needed for mild constipation.    Marland Kitchen epoetin alfa (EPOGEN,PROCRIT) 43329 UNIT/ML injection 20,000 Units as needed.    . Ferrous Sulfate Dried 140 (45 FE) MG TBCR Take 1 tablet by mouth daily.    . furosemide (LASIX) 20 MG tablet Take 20 mg by mouth as needed.    . hydroxyurea (HYDREA) 500 MG capsule Take 500  mg by mouth 2 (two) times daily.     . Misc Natural Products (GLUCOSAMINE CHONDROITIN COMPLX) TABS Take 1 tablet by mouth 2 (two) times daily.    . Omega-3 Fatty Acids (FISH OIL) 1000 MG CAPS Take 1 capsule by mouth daily.    . predniSONE (DELTASONE) 10 MG tablet Take 10 mg by mouth as needed.    . sodium bicarbonate 650 MG tablet Take 650 mg by mouth 2 (two) times daily.    . tacrolimus (PROGRAF) 1 MG capsule Take 1.5 mg by mouth 2 (two) times daily.      No current facility-administered medications on file prior to visit.    Review of Systems  Constitutional: Negative for appetite change and unexpected weight change.  HENT: Negative for congestion and sinus pressure (better now.  ).   Respiratory: Positive for cough (persistent cough.  ). Negative for chest tightness and shortness of breath.   Cardiovascular: Positive for leg swelling (is better.  ). Negative for chest pain and palpitations.  Gastrointestinal: Negative for nausea, vomiting, abdominal pain and diarrhea.  Genitourinary: Negative for dysuria and difficulty urinating.  Musculoskeletal:       Knee pain as outlined.  Saw ortho yesterday.  Wearing a brace.    Neurological: Negative for dizziness, light-headedness and headaches.  Psychiatric/Behavioral: Negative for dysphoric mood and agitation.       Objective:     Blood pressure recheck:  140/62  Physical Exam  Constitutional: She appears well-developed and well-nourished. No distress.  HENT:  Nose: Nose normal.  Mouth/Throat: Oropharynx is clear and moist.  Neck: Neck supple. No thyromegaly present.  Cardiovascular: Normal rate and regular rhythm.   Pulmonary/Chest: Breath sounds normal. No respiratory distress. She has no wheezes.  Abdominal: Soft. Bowel sounds are normal. There is no tenderness.  Musculoskeletal: She exhibits no edema or tenderness.  Lymphadenopathy:    She has no cervical adenopathy.  Skin: No rash noted. No erythema.  Psychiatric: She has a  normal mood and affect. Her behavior is normal.    BP 144/79 mmHg  Pulse 71  Temp(Src) 97.9 F (36.6 C) (Oral)  Ht 5' 1.75" (1.568 m)  Wt 208 lb 4 oz (94.462 kg)  BMI 38.42 kg/m2  SpO2 97% Wt Readings from Last 3 Encounters:  08/29/14 208 lb 4 oz (94.462 kg)  04/09/14 195 lb 4 oz (88.565 kg)  01/22/14 188 lb 12 oz (85.616 kg)       Assessment & Plan:   Problem List Items Addressed This Visit    Anemia in chronic kidney disease    Last hgb 12.1 - 07/17/14.        Chronic kidney disease    S/p transplant and doing well.  Being followed closely at [facility].  They are following her labs.        Cough    Persistent.  No evidence of active infection.  No acid reflux.  Refer back to Dr Jeanne Ivan.        Relevant Orders   Ambulatory referral to Pulmonology   Edema, lower extremity    Better since decreasing the amlodipine to 1/2 dose.  Swelling better.  Follow.        Essential thrombocytosis    Followed by Dr Ma Hillock.  Has been stable.        Health care maintenance    Physical 10/09/13.  Schedule mammogram - overdue.  Colonoscopy four years ago.        Knee pain    Saw ortho yesterday.  Knee brace.  Planning for physical therapy.        Obstructive sleep apnea - Primary    Using CPAP.  Stable.          I spent 25 minutes with the patient and more than 50% of the time was spent in consultation regarding the above.     Diane Pheasant, MD

## 2014-09-09 ENCOUNTER — Encounter: Payer: Self-pay | Admitting: Internal Medicine

## 2014-09-09 DIAGNOSIS — Z1231 Encounter for screening mammogram for malignant neoplasm of breast: Secondary | ICD-10-CM

## 2014-09-09 DIAGNOSIS — Z1239 Encounter for other screening for malignant neoplasm of breast: Secondary | ICD-10-CM

## 2014-09-11 DIAGNOSIS — D485 Neoplasm of uncertain behavior of skin: Secondary | ICD-10-CM | POA: Diagnosis not present

## 2014-09-11 DIAGNOSIS — Z1283 Encounter for screening for malignant neoplasm of skin: Secondary | ICD-10-CM | POA: Diagnosis not present

## 2014-09-11 DIAGNOSIS — Z940 Kidney transplant status: Secondary | ICD-10-CM | POA: Diagnosis not present

## 2014-09-13 DIAGNOSIS — R05 Cough: Secondary | ICD-10-CM | POA: Diagnosis not present

## 2014-09-17 ENCOUNTER — Ambulatory Visit: Payer: Self-pay | Admitting: Internal Medicine

## 2014-09-17 DIAGNOSIS — Z79899 Other long term (current) drug therapy: Secondary | ICD-10-CM | POA: Diagnosis not present

## 2014-09-17 DIAGNOSIS — Z940 Kidney transplant status: Secondary | ICD-10-CM | POA: Diagnosis not present

## 2014-09-17 DIAGNOSIS — T8610 Unspecified complication of kidney transplant: Secondary | ICD-10-CM | POA: Diagnosis not present

## 2014-09-17 DIAGNOSIS — Z1231 Encounter for screening mammogram for malignant neoplasm of breast: Secondary | ICD-10-CM | POA: Diagnosis not present

## 2014-09-17 LAB — HM MAMMOGRAPHY: HM Mammogram: NEGATIVE

## 2014-10-01 DIAGNOSIS — D485 Neoplasm of uncertain behavior of skin: Secondary | ICD-10-CM | POA: Diagnosis not present

## 2014-10-01 DIAGNOSIS — D2261 Melanocytic nevi of right upper limb, including shoulder: Secondary | ICD-10-CM | POA: Diagnosis not present

## 2014-10-01 DIAGNOSIS — D225 Melanocytic nevi of trunk: Secondary | ICD-10-CM | POA: Diagnosis not present

## 2014-10-14 DIAGNOSIS — D649 Anemia, unspecified: Secondary | ICD-10-CM | POA: Diagnosis not present

## 2014-10-14 LAB — CBC AND DIFFERENTIAL
HEMATOCRIT: 27 % — AB (ref 36–46)
Hemoglobin: 9.4 g/dL — AB (ref 12.0–16.0)
Neutrophils Absolute: 2 /uL
PLATELETS: 352 10*3/uL (ref 150–399)
WBC: 3.1 10^3/mL

## 2014-10-15 ENCOUNTER — Encounter: Payer: Self-pay | Admitting: Internal Medicine

## 2014-10-16 ENCOUNTER — Telehealth: Payer: Self-pay | Admitting: Internal Medicine

## 2014-10-16 NOTE — Telephone Encounter (Signed)
Pt has my chart, but please call her and let her know that we received lab results and her hgb is low (9.4).  She had a renal transplant and is followed at [facility].  Has had a history of low hgb in the past, but recently has been better.  See if she is having any active bleeding, etc.  Also, I need to know if she has a f/u appt scheduled with her hematologist - anytime soon.  We need to repeat this lab (and B12 and iron levels) within the week - if not being seen soon at [facility].

## 2014-10-16 NOTE — Telephone Encounter (Signed)
LMTCB on home/cell #.

## 2014-10-17 NOTE — Telephone Encounter (Signed)
Pt states that she took double the amount of her hydroxyuria x 2 weeks before she noticed it. Contact hematologist & was told to just go back to normal dose & hgb would slowly improve. She is scheduled to have labs repeated on 10/29/14.

## 2014-10-17 NOTE — Telephone Encounter (Signed)
Noted  

## 2014-10-21 ENCOUNTER — Telehealth: Payer: Self-pay | Admitting: *Deleted

## 2014-10-21 NOTE — Telephone Encounter (Signed)
Fax from [facility]. PA approved through 07/12/15.

## 2014-10-21 NOTE — Telephone Encounter (Signed)
Fax from pharmacy, needing PA for Ambien. Started online, pending resposne

## 2014-10-22 DIAGNOSIS — N039 Chronic nephritic syndrome with unspecified morphologic changes: Secondary | ICD-10-CM | POA: Diagnosis not present

## 2014-10-22 DIAGNOSIS — D631 Anemia in chronic kidney disease: Secondary | ICD-10-CM | POA: Diagnosis not present

## 2014-10-22 DIAGNOSIS — Z79899 Other long term (current) drug therapy: Secondary | ICD-10-CM | POA: Diagnosis not present

## 2014-10-22 DIAGNOSIS — Z940 Kidney transplant status: Secondary | ICD-10-CM | POA: Diagnosis not present

## 2014-10-22 DIAGNOSIS — T8610 Unspecified complication of kidney transplant: Secondary | ICD-10-CM | POA: Diagnosis not present

## 2014-10-22 DIAGNOSIS — Z789 Other specified health status: Secondary | ICD-10-CM | POA: Diagnosis not present

## 2014-10-22 DIAGNOSIS — N390 Urinary tract infection, site not specified: Secondary | ICD-10-CM | POA: Diagnosis not present

## 2014-10-29 DIAGNOSIS — Z79899 Other long term (current) drug therapy: Secondary | ICD-10-CM | POA: Diagnosis not present

## 2014-10-29 DIAGNOSIS — N189 Chronic kidney disease, unspecified: Secondary | ICD-10-CM | POA: Diagnosis not present

## 2014-10-29 DIAGNOSIS — M25561 Pain in right knee: Secondary | ICD-10-CM | POA: Diagnosis not present

## 2014-10-29 DIAGNOSIS — I10 Essential (primary) hypertension: Secondary | ICD-10-CM | POA: Diagnosis not present

## 2014-10-29 DIAGNOSIS — M25562 Pain in left knee: Secondary | ICD-10-CM | POA: Diagnosis not present

## 2014-10-29 DIAGNOSIS — Z4822 Encounter for aftercare following kidney transplant: Secondary | ICD-10-CM | POA: Diagnosis not present

## 2014-10-29 DIAGNOSIS — Z48298 Encounter for aftercare following other organ transplant: Secondary | ICD-10-CM | POA: Diagnosis not present

## 2014-10-29 DIAGNOSIS — Z940 Kidney transplant status: Secondary | ICD-10-CM | POA: Diagnosis not present

## 2014-10-29 DIAGNOSIS — G8929 Other chronic pain: Secondary | ICD-10-CM | POA: Diagnosis not present

## 2014-10-29 DIAGNOSIS — D473 Essential (hemorrhagic) thrombocythemia: Secondary | ICD-10-CM | POA: Diagnosis not present

## 2014-11-05 ENCOUNTER — Encounter: Payer: Self-pay | Admitting: Internal Medicine

## 2014-12-10 DIAGNOSIS — Z789 Other specified health status: Secondary | ICD-10-CM | POA: Diagnosis not present

## 2014-12-10 DIAGNOSIS — E559 Vitamin D deficiency, unspecified: Secondary | ICD-10-CM | POA: Diagnosis not present

## 2014-12-10 DIAGNOSIS — Z09 Encounter for follow-up examination after completed treatment for conditions other than malignant neoplasm: Secondary | ICD-10-CM | POA: Diagnosis not present

## 2014-12-10 DIAGNOSIS — Z940 Kidney transplant status: Secondary | ICD-10-CM | POA: Diagnosis not present

## 2014-12-10 DIAGNOSIS — Z79899 Other long term (current) drug therapy: Secondary | ICD-10-CM | POA: Diagnosis not present

## 2014-12-10 DIAGNOSIS — D631 Anemia in chronic kidney disease: Secondary | ICD-10-CM | POA: Diagnosis not present

## 2014-12-10 DIAGNOSIS — T8610 Unspecified complication of kidney transplant: Secondary | ICD-10-CM | POA: Diagnosis not present

## 2014-12-13 DIAGNOSIS — R05 Cough: Secondary | ICD-10-CM | POA: Diagnosis not present

## 2014-12-13 DIAGNOSIS — R5381 Other malaise: Secondary | ICD-10-CM | POA: Diagnosis not present

## 2014-12-13 DIAGNOSIS — Z6838 Body mass index (BMI) 38.0-38.9, adult: Secondary | ICD-10-CM | POA: Diagnosis not present

## 2014-12-13 DIAGNOSIS — M1730 Unilateral post-traumatic osteoarthritis, unspecified knee: Secondary | ICD-10-CM | POA: Diagnosis not present

## 2014-12-25 DIAGNOSIS — M170 Bilateral primary osteoarthritis of knee: Secondary | ICD-10-CM | POA: Diagnosis not present

## 2014-12-27 DIAGNOSIS — I272 Other secondary pulmonary hypertension: Secondary | ICD-10-CM | POA: Diagnosis not present

## 2014-12-27 DIAGNOSIS — R05 Cough: Secondary | ICD-10-CM | POA: Diagnosis not present

## 2014-12-27 DIAGNOSIS — Z6839 Body mass index (BMI) 39.0-39.9, adult: Secondary | ICD-10-CM | POA: Diagnosis not present

## 2014-12-27 DIAGNOSIS — G4733 Obstructive sleep apnea (adult) (pediatric): Secondary | ICD-10-CM | POA: Diagnosis not present

## 2014-12-27 DIAGNOSIS — R0600 Dyspnea, unspecified: Secondary | ICD-10-CM | POA: Diagnosis not present

## 2015-01-29 DIAGNOSIS — R05 Cough: Secondary | ICD-10-CM | POA: Diagnosis not present

## 2015-01-29 DIAGNOSIS — Z4822 Encounter for aftercare following kidney transplant: Secondary | ICD-10-CM | POA: Diagnosis not present

## 2015-01-29 DIAGNOSIS — I509 Heart failure, unspecified: Secondary | ICD-10-CM | POA: Diagnosis not present

## 2015-01-29 DIAGNOSIS — Z79899 Other long term (current) drug therapy: Secondary | ICD-10-CM | POA: Diagnosis not present

## 2015-01-29 DIAGNOSIS — D473 Essential (hemorrhagic) thrombocythemia: Secondary | ICD-10-CM | POA: Diagnosis not present

## 2015-01-29 DIAGNOSIS — Z48298 Encounter for aftercare following other organ transplant: Secondary | ICD-10-CM | POA: Diagnosis not present

## 2015-01-29 DIAGNOSIS — Z940 Kidney transplant status: Secondary | ICD-10-CM | POA: Diagnosis not present

## 2015-01-29 DIAGNOSIS — I151 Hypertension secondary to other renal disorders: Secondary | ICD-10-CM | POA: Diagnosis not present

## 2015-01-29 DIAGNOSIS — I272 Other secondary pulmonary hypertension: Secondary | ICD-10-CM | POA: Diagnosis not present

## 2015-01-29 DIAGNOSIS — N2889 Other specified disorders of kidney and ureter: Secondary | ICD-10-CM | POA: Diagnosis not present

## 2015-02-03 DIAGNOSIS — I517 Cardiomegaly: Secondary | ICD-10-CM | POA: Diagnosis not present

## 2015-02-03 DIAGNOSIS — I272 Other secondary pulmonary hypertension: Secondary | ICD-10-CM | POA: Diagnosis not present

## 2015-02-03 DIAGNOSIS — I700 Atherosclerosis of aorta: Secondary | ICD-10-CM | POA: Diagnosis not present

## 2015-02-03 DIAGNOSIS — I361 Nonrheumatic tricuspid (valve) insufficiency: Secondary | ICD-10-CM | POA: Diagnosis not present

## 2015-02-07 DIAGNOSIS — R0600 Dyspnea, unspecified: Secondary | ICD-10-CM | POA: Diagnosis not present

## 2015-02-07 DIAGNOSIS — Z8249 Family history of ischemic heart disease and other diseases of the circulatory system: Secondary | ICD-10-CM | POA: Diagnosis not present

## 2015-02-07 DIAGNOSIS — R05 Cough: Secondary | ICD-10-CM | POA: Diagnosis not present

## 2015-02-07 DIAGNOSIS — R0602 Shortness of breath: Secondary | ICD-10-CM | POA: Diagnosis not present

## 2015-02-07 DIAGNOSIS — I272 Other secondary pulmonary hypertension: Secondary | ICD-10-CM | POA: Diagnosis not present

## 2015-02-07 DIAGNOSIS — I5030 Unspecified diastolic (congestive) heart failure: Secondary | ICD-10-CM | POA: Diagnosis not present

## 2015-02-07 DIAGNOSIS — Z940 Kidney transplant status: Secondary | ICD-10-CM | POA: Diagnosis not present

## 2015-02-07 DIAGNOSIS — Z6839 Body mass index (BMI) 39.0-39.9, adult: Secondary | ICD-10-CM | POA: Diagnosis not present

## 2015-02-07 DIAGNOSIS — Z841 Family history of disorders of kidney and ureter: Secondary | ICD-10-CM | POA: Diagnosis not present

## 2015-02-07 DIAGNOSIS — Z9989 Dependence on other enabling machines and devices: Secondary | ICD-10-CM | POA: Diagnosis not present

## 2015-02-07 DIAGNOSIS — Z87891 Personal history of nicotine dependence: Secondary | ICD-10-CM | POA: Diagnosis not present

## 2015-02-07 DIAGNOSIS — Z79899 Other long term (current) drug therapy: Secondary | ICD-10-CM | POA: Diagnosis not present

## 2015-02-07 DIAGNOSIS — Q2730 Arteriovenous malformation, site unspecified: Secondary | ICD-10-CM | POA: Diagnosis not present

## 2015-02-07 DIAGNOSIS — D509 Iron deficiency anemia, unspecified: Secondary | ICD-10-CM | POA: Diagnosis not present

## 2015-02-07 DIAGNOSIS — D473 Essential (hemorrhagic) thrombocythemia: Secondary | ICD-10-CM | POA: Diagnosis not present

## 2015-02-07 DIAGNOSIS — G4733 Obstructive sleep apnea (adult) (pediatric): Secondary | ICD-10-CM | POA: Diagnosis not present

## 2015-02-07 DIAGNOSIS — Z7951 Long term (current) use of inhaled steroids: Secondary | ICD-10-CM | POA: Diagnosis not present

## 2015-02-07 DIAGNOSIS — M1990 Unspecified osteoarthritis, unspecified site: Secondary | ICD-10-CM | POA: Diagnosis not present

## 2015-02-25 DIAGNOSIS — Z6841 Body Mass Index (BMI) 40.0 and over, adult: Secondary | ICD-10-CM | POA: Diagnosis not present

## 2015-02-25 DIAGNOSIS — D473 Essential (hemorrhagic) thrombocythemia: Secondary | ICD-10-CM | POA: Diagnosis not present

## 2015-03-01 DIAGNOSIS — Z7982 Long term (current) use of aspirin: Secondary | ICD-10-CM | POA: Diagnosis not present

## 2015-03-01 DIAGNOSIS — I5030 Unspecified diastolic (congestive) heart failure: Secondary | ICD-10-CM | POA: Diagnosis not present

## 2015-03-01 DIAGNOSIS — M1990 Unspecified osteoarthritis, unspecified site: Secondary | ICD-10-CM | POA: Diagnosis present

## 2015-03-01 DIAGNOSIS — Z940 Kidney transplant status: Secondary | ICD-10-CM | POA: Diagnosis not present

## 2015-03-01 DIAGNOSIS — N189 Chronic kidney disease, unspecified: Secondary | ICD-10-CM | POA: Diagnosis not present

## 2015-03-01 DIAGNOSIS — R05 Cough: Secondary | ICD-10-CM | POA: Diagnosis not present

## 2015-03-01 DIAGNOSIS — R0602 Shortness of breath: Secondary | ICD-10-CM | POA: Diagnosis not present

## 2015-03-01 DIAGNOSIS — I272 Other secondary pulmonary hypertension: Secondary | ICD-10-CM | POA: Diagnosis present

## 2015-03-01 DIAGNOSIS — R509 Fever, unspecified: Secondary | ICD-10-CM | POA: Diagnosis not present

## 2015-03-01 DIAGNOSIS — M109 Gout, unspecified: Secondary | ICD-10-CM | POA: Diagnosis present

## 2015-03-01 DIAGNOSIS — I129 Hypertensive chronic kidney disease with stage 1 through stage 4 chronic kidney disease, or unspecified chronic kidney disease: Secondary | ICD-10-CM | POA: Diagnosis present

## 2015-03-01 DIAGNOSIS — B9620 Unspecified Escherichia coli [E. coli] as the cause of diseases classified elsewhere: Secondary | ICD-10-CM | POA: Diagnosis present

## 2015-03-01 DIAGNOSIS — M791 Myalgia: Secondary | ICD-10-CM | POA: Diagnosis not present

## 2015-03-01 DIAGNOSIS — G4733 Obstructive sleep apnea (adult) (pediatric): Secondary | ICD-10-CM | POA: Diagnosis present

## 2015-03-01 DIAGNOSIS — Z87891 Personal history of nicotine dependence: Secondary | ICD-10-CM | POA: Diagnosis not present

## 2015-03-01 DIAGNOSIS — N390 Urinary tract infection, site not specified: Secondary | ICD-10-CM | POA: Diagnosis not present

## 2015-03-01 DIAGNOSIS — Z8249 Family history of ischemic heart disease and other diseases of the circulatory system: Secondary | ICD-10-CM | POA: Diagnosis not present

## 2015-03-01 DIAGNOSIS — D631 Anemia in chronic kidney disease: Secondary | ICD-10-CM | POA: Diagnosis present

## 2015-03-01 DIAGNOSIS — D473 Essential (hemorrhagic) thrombocythemia: Secondary | ICD-10-CM | POA: Diagnosis present

## 2015-03-24 DIAGNOSIS — Z538 Procedure and treatment not carried out for other reasons: Secondary | ICD-10-CM | POA: Diagnosis not present

## 2015-03-24 DIAGNOSIS — R509 Fever, unspecified: Secondary | ICD-10-CM | POA: Diagnosis not present

## 2015-03-24 DIAGNOSIS — Z940 Kidney transplant status: Secondary | ICD-10-CM | POA: Diagnosis not present

## 2015-03-24 DIAGNOSIS — Z79899 Other long term (current) drug therapy: Secondary | ICD-10-CM | POA: Diagnosis not present

## 2015-03-24 DIAGNOSIS — I272 Other secondary pulmonary hypertension: Secondary | ICD-10-CM | POA: Diagnosis not present

## 2015-03-26 DIAGNOSIS — M25562 Pain in left knee: Secondary | ICD-10-CM | POA: Diagnosis not present

## 2015-03-26 DIAGNOSIS — M25561 Pain in right knee: Secondary | ICD-10-CM | POA: Diagnosis not present

## 2015-04-01 DIAGNOSIS — I871 Compression of vein: Secondary | ICD-10-CM | POA: Diagnosis not present

## 2015-04-01 DIAGNOSIS — M7989 Other specified soft tissue disorders: Secondary | ICD-10-CM | POA: Diagnosis not present

## 2015-04-18 DIAGNOSIS — R0600 Dyspnea, unspecified: Secondary | ICD-10-CM | POA: Diagnosis not present

## 2015-04-18 DIAGNOSIS — Z6841 Body Mass Index (BMI) 40.0 and over, adult: Secondary | ICD-10-CM | POA: Diagnosis not present

## 2015-04-25 DIAGNOSIS — Z23 Encounter for immunization: Secondary | ICD-10-CM | POA: Diagnosis not present

## 2015-05-06 DIAGNOSIS — Z79899 Other long term (current) drug therapy: Secondary | ICD-10-CM | POA: Diagnosis not present

## 2015-05-06 DIAGNOSIS — T8610 Unspecified complication of kidney transplant: Secondary | ICD-10-CM | POA: Diagnosis not present

## 2015-05-06 DIAGNOSIS — Z940 Kidney transplant status: Secondary | ICD-10-CM | POA: Diagnosis not present

## 2015-05-09 DIAGNOSIS — G4733 Obstructive sleep apnea (adult) (pediatric): Secondary | ICD-10-CM | POA: Diagnosis not present

## 2015-05-17 DIAGNOSIS — G4733 Obstructive sleep apnea (adult) (pediatric): Secondary | ICD-10-CM | POA: Diagnosis not present

## 2015-05-19 DIAGNOSIS — Z1283 Encounter for screening for malignant neoplasm of skin: Secondary | ICD-10-CM | POA: Diagnosis not present

## 2015-05-19 DIAGNOSIS — Z872 Personal history of diseases of the skin and subcutaneous tissue: Secondary | ICD-10-CM | POA: Diagnosis not present

## 2015-05-19 DIAGNOSIS — D485 Neoplasm of uncertain behavior of skin: Secondary | ICD-10-CM | POA: Diagnosis not present

## 2015-05-21 ENCOUNTER — Ambulatory Visit (INDEPENDENT_AMBULATORY_CARE_PROVIDER_SITE_OTHER): Payer: Medicare Other

## 2015-05-21 VITALS — BP 138/62 | HR 65 | Temp 98.0°F | Resp 14 | Ht 61.5 in | Wt 218.0 lb

## 2015-05-21 DIAGNOSIS — Z0000 Encounter for general adult medical examination without abnormal findings: Secondary | ICD-10-CM | POA: Diagnosis not present

## 2015-05-21 NOTE — Patient Instructions (Addendum)
Diane Guerra,  Thank you for taking time to come for your Medicare Wellness Visit.  I appreciate your ongoing commitment to your health goals. Please review the following plan we discussed and let me know if I can assist you in the future.    Fall Prevention in the Home  Falls can cause injuries. They can happen to people of all ages. There are many things you can do to make your home safe and to help prevent falls.  WHAT CAN I DO ON THE OUTSIDE OF MY HOME?  Regularly fix the edges of walkways and driveways and fix any cracks.  Remove anything that might make you trip as you walk through a door, such as a raised step or threshold.  Trim any bushes or trees on the path to your home.  Use bright outdoor lighting.  Clear any walking paths of anything that might make someone trip, such as rocks or tools.  Regularly check to see if handrails are loose or broken. Make sure that both sides of any steps have handrails.  Any raised decks and porches should have guardrails on the edges.  Have any leaves, snow, or ice cleared regularly.  Use sand or salt on walking paths during winter.  Clean up any spills in your garage right away. This includes oil or grease spills. WHAT CAN I DO IN THE BATHROOM?   Use night lights.  Install grab bars by the toilet and in the tub and shower. Do not use towel bars as grab bars.  Use non-skid mats or decals in the tub or shower.  If you need to sit down in the shower, use a plastic, non-slip stool.  Keep the floor dry. Clean up any water that spills on the floor as soon as it happens.  Remove soap buildup in the tub or shower regularly.  Attach bath mats securely with double-sided non-slip rug tape.  Do not have throw rugs and other things on the floor that can make you trip. WHAT CAN I DO IN THE BEDROOM?  Use night lights.  Make sure that you have a light by your bed that is easy to reach.  Do not use any sheets or blankets that are too big for  your bed. They should not hang down onto the floor.  Have a firm chair that has side arms. You can use this for support while you get dressed.  Do not have throw rugs and other things on the floor that can make you trip. WHAT CAN I DO IN THE KITCHEN?  Clean up any spills right away.  Avoid walking on wet floors.  Keep items that you use a lot in easy-to-reach places.  If you need to reach something above you, use a strong step stool that has a grab bar.  Keep electrical cords out of the way.  Do not use floor polish or wax that makes floors slippery. If you must use wax, use non-skid floor wax.  Do not have throw rugs and other things on the floor that can make you trip. WHAT CAN I DO WITH MY STAIRS?  Do not leave any items on the stairs.  Make sure that there are handrails on both sides of the stairs and use them. Fix handrails that are broken or loose. Make sure that handrails are as long as the stairways.  Check any carpeting to make sure that it is firmly attached to the stairs. Fix any carpet that is loose or worn.  Avoid having throw rugs at the top or bottom of the stairs. If you do have throw rugs, attach them to the floor with carpet tape.  Make sure that you have a light switch at the top of the stairs and the bottom of the stairs. If you do not have them, ask someone to add them for you. WHAT ELSE CAN I DO TO HELP PREVENT FALLS?  Wear shoes that:  Do not have high heels.  Have rubber bottoms.  Are comfortable and fit you well.  Are closed at the toe. Do not wear sandals.  If you use a stepladder:  Make sure that it is fully opened. Do not climb a closed stepladder.  Make sure that both sides of the stepladder are locked into place.  Ask someone to hold it for you, if possible.  Clearly mark and make sure that you can see:  Any grab bars or handrails.  First and last steps.  Where the edge of each step is.  Use tools that help you move around  (mobility aids) if they are needed. These include:  Canes.  Walkers.  Scooters.  Crutches.  Turn on the lights when you go into a dark area. Replace any light bulbs as soon as they burn out.  Set up your furniture so you have a clear path. Avoid moving your furniture around.  If any of your floors are uneven, fix them.  If there are any pets around you, be aware of where they are.  Review your medicines with your doctor. Some medicines can make you feel dizzy. This can increase your chance of falling. Ask your doctor what other things that you can do to help prevent falls.   This information is not intended to replace advice given to you by your health care provider. Make sure you discuss any questions you have with your health care provider.   Document Released: 04/24/2009 Document Revised: 11/12/2014 Document Reviewed: 08/02/2014 Elsevier Interactive Patient Education [employer].

## 2015-05-21 NOTE — Progress Notes (Signed)
Subjective:   Diane Guerra is a 64 y.o. female who presents for an Initial Medicare Annual Wellness Visit.  Review of Systems    No ROS.  Medicare Wellness Visit.  Cardiac Risk Factors include: advanced age (>67men, >59 women)     Objective:    Today's Vitals   05/21/15 1401  BP: 138/62  Pulse: 65  Temp: 98 F (36.7 C)  TempSrc: Oral  Resp: 14  Height: 5' 1.5" (1.562 m)  Weight: 218 lb (98.884 kg)  SpO2: 98%    Current Medications (verified) Outpatient Encounter Prescriptions as of 05/21/2015  Medication Sig  . allopurinol (ZYLOPRIM) 100 MG tablet Take 100 mg by mouth 2 (two) times daily.   Marland Kitchen amLODipine (NORVASC) 5 MG tablet Take 5 mg by mouth daily.  Marland Kitchen aspirin 81 MG tablet Take 81 mg by mouth daily.  . carvedilol (COREG) 25 MG tablet Take 25 mg by mouth 2 (two) times daily with a meal.  . cholecalciferol (VITAMIN D) 1000 UNITS tablet Take 1,000 Units by mouth daily.  . Ferrous Sulfate Dried 140 (45 FE) MG TBCR Take 1 tablet by mouth daily.  . furosemide (LASIX) 20 MG tablet Take 20 mg by mouth as needed.  . hydroxyurea (HYDREA) 500 MG capsule Take 500 mg by mouth 2 (two) times daily.   Marland Kitchen losartan (COZAAR) 50 MG tablet Take 50 mg by mouth 2 (two) times daily.  . Misc Natural Products (GLUCOSAMINE CHONDROITIN COMPLX) TABS Take 1 tablet by mouth 2 (two) times daily.  . Omega-3 Fatty Acids (FISH OIL) 1000 MG CAPS Take 1 capsule by mouth daily.  . sodium bicarbonate 650 MG tablet Take 650 mg by mouth 2 (two) times daily.  . tacrolimus (PROGRAF) 1 MG capsule Take 1.5 mg by mouth 2 (two) times daily.   Marland Kitchen acetaminophen (TYLENOL) 325 MG tablet Take 650 mg by mouth every 6 (six) hours as needed.  . docusate sodium (COLACE) 100 MG capsule Take 100 mg by mouth daily as needed for mild constipation.  . predniSONE (DELTASONE) 10 MG tablet Take 10 mg by mouth as needed.  . zolpidem (AMBIEN) 5 MG tablet Take 1 tablet (5 mg total) by mouth at bedtime as needed for sleep. (Patient not  taking: Reported on 05/21/2015)  . [DISCONTINUED] epoetin alfa (EPOGEN,PROCRIT) 16109 UNIT/ML injection 20,000 Units as needed.   No facility-administered encounter medications on file as of 05/21/2015.    Allergies (verified) Review of patient's allergies indicates no known allergies.   History: Past Medical History  Diagnosis Date  . Sleep apnea with use of continuous positive airway pressure (CPAP)   . Gout   . Anemia   . Pulmonary edema 2014  . Essential thrombocytosis ([place])   . Chronic kidney disease 2007    followed by Dr Juanito Doom  . Kidney failure 2014   Past Surgical History  Procedure Laterality Date  . Hand surgery    . Kidney transplant  [phone]   Family History  Problem Relation Age of Onset  . Kidney disease Father   . Heart disease Father     myocardial infarction   Social History   Occupational History  . Not on file.   Social History Main Topics  . Smoking status: Former Research scientist (life sciences)  . Smokeless tobacco: Never Used  . Alcohol Use: 0.0 oz/week    0 Standard drinks or equivalent per week     Comment: has an occasional galss of wine  . Drug Use: No  . Sexual  Activity: Yes    Tobacco Counseling Counseling given: Not Answered   Activities of Daily Living In your present state of health, do you have any difficulty performing the following activities: 05/21/2015  Hearing? N  Vision? N  Difficulty concentrating or making decisions? N  Walking or climbing stairs? Y  Dressing or bathing? N  Doing errands, shopping? N  Preparing Food and eating ? N  Using the Toilet? N  In the past six months, have you accidently leaked urine? N  Do you have problems with loss of bowel control? N  Managing your Medications? N  Managing your Finances? N  Housekeeping or managing your Housekeeping? N    Immunizations and Health Maintenance Immunization History  Administered Date(s) Administered  . Influenza-Unspecified 04/16/2014  . Pneumococcal Conjugate-13  04/16/2014   There are no preventive care reminders to display for this patient.  Patient Care Team: Einar Pheasant, MD as PCP - General (Internal Medicine)  Indicate any recent Medical Services you may have received from other than [facility] providers in the past year (date may be approximate).     Assessment:   This is a routine wellness examination for [place].  The goal of the wellness visit is to assist the patient how to close the gaps in care and create a preventative care plan for the patient.   Calcium and Vit D as appropriate/ Osteoporosis risk reviewed.  Taking meds without issues; no barriers identified.  Safety issues reviewed; smoke detectors in the home. Firearms locked in a secure area. Wears seatbelts when driving or riding with others. No violence in the home.  No identified risk were noted; The patient was oriented x 3; appropriate in dress and manner and no objective failures at ADL's or IADL's.   Influenza vaccine, Pneumococcal 23 vaccine, TDAP, Hep C Screenig, HIV Screening and Dexa Scan referral postponed, per patient request.  ZOSTAVAX declined.  Patient Concerns: Dx of pulmonary hypertension.  Swelling with fluctuating weight gain/loss.  Currently followed and being addressed at [facility], Dr. Marijean Bravo and Dr. Radene Knee.  Deferred to follow up with PCP as needed.   Hearing/Vision screen Hearing Screening Comments: Passes the whisper test.  Vision Screening Comments: Followed by [facility] 07/2014 Wears glasses  Dietary issues and exercise activities discussed: Current Exercise Habits:: Home exercise routine, Time (Minutes): 15, Intensity: Mild  Goals    . Increase physical activity     Patient centered goal is to use exercise bike at home everyday up to 15 minutes or as tolerated.      Depression Screen PHQ 2/9 Scores 05/21/2015 08/29/2014  PHQ - 2 Score 0 0    Fall Risk Fall Risk  05/21/2015 08/29/2014  Falls in the past year? No No     Cognitive Function: MMSE - Mini Mental State Exam 05/21/2015  Orientation to time 5  Orientation to Place 5  Registration 3  Attention/ Calculation 5  Recall 3  Language- name 2 objects 2  Language- repeat 1  Language- follow 3 step command 3  Language- read & follow direction 1  Write a sentence 1  Copy design 1  Total score 30    Screening Tests Health Maintenance  Topic Date Due  . Hepatitis C Screening  07/21/2015 (Originally 1951-06-09)  . DEXA SCAN  07/22/2015 (Originally 07/10/2014)  . TETANUS/TDAP  07/22/2015 (Originally 07/10/1968)  . HIV Screening  07/22/2015 (Originally 07/10/1964)  . PNA vac Low Risk Adult (2 of 2 - PPSV23) 07/22/2015 (Originally 04/17/2015)  .  ZOSTAVAX  05/21/2016 (Originally 07/10/2009)  . MAMMOGRAM  09/17/2015  . INFLUENZA VACCINE  02/10/2016  . PAP SMEAR  10/09/2016  . COLONOSCOPY  07/12/2018      Plan:   End of life planning was discussed; aging in home or other; advanced directives; plans to return completed copy of HCPOA/Living Will.   Return in 2 months for CPE.  During the course of the visit, [place] was educated and counseled about the following appropriate screening and preventive services:   Vaccines to include Pneumoccal, Influenza, Hepatitis B, Td, Zostavax, HCV  Electrocardiogram  Cardiovascular disease screening  Colorectal cancer screening  Bone density screening  Diabetes screening  Glaucoma screening  Mammography/PAP  Nutrition counseling  Smoking cessation counseling  Patient Instructions (the written plan) were given to the patient.    Varney Biles, LPN   [id number]    Reviewed above information.  Agree with plan.   Dr Nicki Reaper

## 2015-06-03 DIAGNOSIS — I509 Heart failure, unspecified: Secondary | ICD-10-CM | POA: Diagnosis not present

## 2015-06-03 DIAGNOSIS — Z6841 Body Mass Index (BMI) 40.0 and over, adult: Secondary | ICD-10-CM | POA: Diagnosis not present

## 2015-06-03 DIAGNOSIS — R05 Cough: Secondary | ICD-10-CM | POA: Diagnosis not present

## 2015-06-03 DIAGNOSIS — I272 Other secondary pulmonary hypertension: Secondary | ICD-10-CM | POA: Diagnosis not present

## 2015-06-17 DIAGNOSIS — Z48298 Encounter for aftercare following other organ transplant: Secondary | ICD-10-CM | POA: Diagnosis not present

## 2015-06-17 DIAGNOSIS — K8020 Calculus of gallbladder without cholecystitis without obstruction: Secondary | ICD-10-CM | POA: Diagnosis not present

## 2015-06-17 DIAGNOSIS — R05 Cough: Secondary | ICD-10-CM | POA: Diagnosis not present

## 2015-06-17 DIAGNOSIS — Z79899 Other long term (current) drug therapy: Secondary | ICD-10-CM | POA: Diagnosis not present

## 2015-06-17 DIAGNOSIS — D899 Disorder involving the immune mechanism, unspecified: Secondary | ICD-10-CM | POA: Diagnosis not present

## 2015-06-17 DIAGNOSIS — R0602 Shortness of breath: Secondary | ICD-10-CM | POA: Diagnosis not present

## 2015-06-17 DIAGNOSIS — I517 Cardiomegaly: Secondary | ICD-10-CM | POA: Diagnosis not present

## 2015-06-17 DIAGNOSIS — Z23 Encounter for immunization: Secondary | ICD-10-CM | POA: Diagnosis not present

## 2015-06-17 DIAGNOSIS — I509 Heart failure, unspecified: Secondary | ICD-10-CM | POA: Diagnosis not present

## 2015-06-17 DIAGNOSIS — J449 Chronic obstructive pulmonary disease, unspecified: Secondary | ICD-10-CM | POA: Diagnosis not present

## 2015-06-17 DIAGNOSIS — I10 Essential (primary) hypertension: Secondary | ICD-10-CM | POA: Diagnosis not present

## 2015-06-17 DIAGNOSIS — N270 Small kidney, unilateral: Secondary | ICD-10-CM | POA: Diagnosis not present

## 2015-06-17 DIAGNOSIS — I151 Hypertension secondary to other renal disorders: Secondary | ICD-10-CM | POA: Diagnosis not present

## 2015-06-17 DIAGNOSIS — I110 Hypertensive heart disease with heart failure: Secondary | ICD-10-CM | POA: Diagnosis not present

## 2015-06-17 DIAGNOSIS — D473 Essential (hemorrhagic) thrombocythemia: Secondary | ICD-10-CM | POA: Diagnosis not present

## 2015-06-17 DIAGNOSIS — Z940 Kidney transplant status: Secondary | ICD-10-CM | POA: Diagnosis not present

## 2015-06-17 DIAGNOSIS — I272 Other secondary pulmonary hypertension: Secondary | ICD-10-CM | POA: Diagnosis not present

## 2015-06-17 DIAGNOSIS — R801 Persistent proteinuria, unspecified: Secondary | ICD-10-CM | POA: Diagnosis not present

## 2015-06-17 DIAGNOSIS — M109 Gout, unspecified: Secondary | ICD-10-CM | POA: Diagnosis not present

## 2015-06-17 DIAGNOSIS — R600 Localized edema: Secondary | ICD-10-CM | POA: Diagnosis not present

## 2015-06-17 DIAGNOSIS — Z114 Encounter for screening for human immunodeficiency virus [HIV]: Secondary | ICD-10-CM | POA: Diagnosis not present

## 2015-06-17 DIAGNOSIS — Z4822 Encounter for aftercare following kidney transplant: Secondary | ICD-10-CM | POA: Diagnosis not present

## 2015-06-17 DIAGNOSIS — Z6841 Body Mass Index (BMI) 40.0 and over, adult: Secondary | ICD-10-CM | POA: Diagnosis not present

## 2015-06-17 DIAGNOSIS — N2889 Other specified disorders of kidney and ureter: Secondary | ICD-10-CM | POA: Diagnosis not present

## 2015-06-17 DIAGNOSIS — Z1159 Encounter for screening for other viral diseases: Secondary | ICD-10-CM | POA: Diagnosis not present

## 2015-07-15 DIAGNOSIS — I361 Nonrheumatic tricuspid (valve) insufficiency: Secondary | ICD-10-CM | POA: Diagnosis not present

## 2015-07-15 DIAGNOSIS — I509 Heart failure, unspecified: Secondary | ICD-10-CM | POA: Diagnosis not present

## 2015-07-15 DIAGNOSIS — I081 Rheumatic disorders of both mitral and tricuspid valves: Secondary | ICD-10-CM | POA: Diagnosis not present

## 2015-07-15 DIAGNOSIS — I517 Cardiomegaly: Secondary | ICD-10-CM | POA: Diagnosis not present

## 2015-07-15 DIAGNOSIS — I272 Other secondary pulmonary hypertension: Secondary | ICD-10-CM | POA: Diagnosis not present

## 2015-07-15 DIAGNOSIS — I058 Other rheumatic mitral valve diseases: Secondary | ICD-10-CM | POA: Diagnosis not present

## 2015-08-01 ENCOUNTER — Encounter: Payer: Self-pay | Admitting: Internal Medicine

## 2015-08-01 ENCOUNTER — Ambulatory Visit (INDEPENDENT_AMBULATORY_CARE_PROVIDER_SITE_OTHER): Payer: Medicare Other | Admitting: Internal Medicine

## 2015-08-01 VITALS — BP 100/60 | HR 70 | Temp 97.5°F | Resp 18 | Ht 61.25 in | Wt 216.0 lb

## 2015-08-01 DIAGNOSIS — Z1239 Encounter for other screening for malignant neoplasm of breast: Secondary | ICD-10-CM | POA: Diagnosis not present

## 2015-08-01 DIAGNOSIS — R0602 Shortness of breath: Secondary | ICD-10-CM

## 2015-08-01 DIAGNOSIS — D473 Essential (hemorrhagic) thrombocythemia: Secondary | ICD-10-CM

## 2015-08-01 DIAGNOSIS — M25569 Pain in unspecified knee: Secondary | ICD-10-CM

## 2015-08-01 DIAGNOSIS — N189 Chronic kidney disease, unspecified: Secondary | ICD-10-CM

## 2015-08-01 DIAGNOSIS — G4733 Obstructive sleep apnea (adult) (pediatric): Secondary | ICD-10-CM | POA: Diagnosis not present

## 2015-08-01 DIAGNOSIS — R600 Localized edema: Secondary | ICD-10-CM

## 2015-08-01 DIAGNOSIS — D631 Anemia in chronic kidney disease: Secondary | ICD-10-CM

## 2015-08-01 DIAGNOSIS — Z0000 Encounter for general adult medical examination without abnormal findings: Secondary | ICD-10-CM

## 2015-08-01 MED ORDER — ZOLPIDEM TARTRATE 5 MG PO TABS: 5.0000 mg | ORAL_TABLET | Freq: Every evening | ORAL | Status: DC | PRN

## 2015-08-01 NOTE — Progress Notes (Signed)
Patient ID: Diane Guerra, female   DOB: July 30, 1950, 65 y.o.   MRN: [id number]   Subjective:    Patient ID: Diane Guerra, female    DOB: 26-Oct-1950, 65 y.o.   MRN: [id number]  HPI  Patient with past history of CKD s/p renal transplan, anemia, documented pulmonary hypertension and sleep apnea.  She comes in today to follow up on these issues as well as for her complete physical exam.  Was just recently evaluated by nephrology.  Lasix increased.   Spironolactone added.  Amlodipine decreased.  Has not had f/u labs since the change in these medications.  States blood pressures averaging 117-120/60s.  She reports noticing sob with exertion.  Had recent ECHO.  Results reviewed.  EF ok  PAP 35.  No abdominal pain or cramping.  Bowels stable.  No chest pain.  Does report bilateral knee pains.  Limits her activity.     Past Medical History  Diagnosis Date  . Sleep apnea with use of continuous positive airway pressure (CPAP)   . Gout   . Anemia   . Pulmonary edema 2014  . Essential thrombocytosis ([place])   . Chronic kidney disease 2007    followed by Dr Juanito Doom  . Kidney failure 2014   Past Surgical History  Procedure Laterality Date  . Hand surgery    . Kidney transplant  [phone]   Family History  Problem Relation Age of Onset  . Kidney disease Father   . Heart disease Father     myocardial infarction   Social History   Social History  . Marital Status: Married    Spouse Name: N/A  . Number of Children: N/A  . Years of Education: N/A   Social History Main Topics  . Smoking status: Former Research scientist (life sciences)  . Smokeless tobacco: Never Used  . Alcohol Use: 0.0 oz/week    0 Standard drinks or equivalent per week     Comment: has an occasional galss of wine  . Drug Use: No  . Sexual Activity: Yes   Other Topics Concern  . None   Social History Narrative    Outpatient Encounter Prescriptions as of 08/01/2015  Medication Sig  . acetaminophen (TYLENOL) 325 MG tablet Take 650 mg by mouth  every 6 (six) hours as needed.  Marland Kitchen allopurinol (ZYLOPRIM) 100 MG tablet Take 100 mg by mouth 2 (two) times daily.   Marland Kitchen amLODipine (NORVASC) 5 MG tablet Take 5 mg by mouth daily.  Marland Kitchen aspirin 81 MG tablet Take 81 mg by mouth daily.  Marland Kitchen azelastine (ASTELIN) 0.1 % nasal spray as needed.  . carvedilol (COREG) 25 MG tablet Take 25 mg by mouth 2 (two) times daily with a meal.  . cholecalciferol (VITAMIN D) 1000 UNITS tablet Take 1,000 Units by mouth daily.  Marland Kitchen docusate sodium (COLACE) 100 MG capsule Take 100 mg by mouth daily as needed for mild constipation.  . Ferrous Sulfate Dried 140 (45 FE) MG TBCR Take 1 tablet by mouth daily.  . fluticasone (FLONASE) 50 MCG/ACT nasal spray as needed.  . furosemide (LASIX) 20 MG tablet Take 40 mg by mouth daily.   . hydroxyurea (HYDREA) 500 MG capsule Take 500 mg by mouth 2 (two) times daily.   Marland Kitchen losartan (COZAAR) 50 MG tablet Take 50 mg by mouth 2 (two) times daily.  . Misc Natural Products (GLUCOSAMINE CHONDROITIN COMPLX) TABS Take 1 tablet by mouth 2 (two) times daily.  Marland Kitchen MYFORTIC 180 MG EC tablet TAKE 2 TABLETS PO BID  .  Omega-3 Fatty Acids (FISH OIL) 1000 MG CAPS Take 1 capsule by mouth daily.  Marland Kitchen rOPINIRole (REQUIP) 0.25 MG tablet Take 0.25 mg by mouth at bedtime as needed.  . sodium bicarbonate 650 MG tablet Take 650 mg by mouth 2 (two) times daily.  Marland Kitchen spironolactone (ALDACTONE) 25 MG tablet Take 25 mg by mouth.  . tacrolimus (PROGRAF) 1 MG capsule Take 1.5 mg by mouth 2 (two) times daily.   Marland Kitchen zolpidem (AMBIEN) 5 MG tablet Take 1 tablet (5 mg total) by mouth at bedtime as needed for sleep.  . [DISCONTINUED] zolpidem (AMBIEN) 5 MG tablet Take 1 tablet (5 mg total) by mouth at bedtime as needed for sleep.  . predniSONE (DELTASONE) 10 MG tablet Take 10 mg by mouth as needed. Reported on 08/01/2015   No facility-administered encounter medications on file as of 08/01/2015.  Marland Kitchened  Review of Systems  Constitutional: Negative for appetite change and unexpected weight  change.  HENT: Negative for congestion and sinus pressure.   Eyes: Negative for pain and visual disturbance.  Respiratory: Positive for shortness of breath. Negative for cough and chest tightness.   Cardiovascular: Positive for leg swelling. Negative for chest pain and palpitations.  Gastrointestinal: Negative for nausea, vomiting, abdominal pain and diarrhea.  Genitourinary: Negative for dysuria and difficulty urinating.  Musculoskeletal: Negative for back pain and joint swelling.       Bilateral knee pain.   Skin: Negative for color change and rash.  Neurological: Negative for dizziness, light-headedness and headaches.  Hematological: Negative for adenopathy. Does not bruise/bleed easily.  Psychiatric/Behavioral: Negative for dysphoric mood and agitation.       Objective:    Physical Exam  Constitutional: She is oriented to person, place, and time. She appears well-developed and well-nourished. No distress.  HENT:  Nose: Nose normal.  Mouth/Throat: Oropharynx is clear and moist.  Eyes: Right eye exhibits no discharge. Left eye exhibits no discharge. No scleral icterus.  Neck: Neck supple. No thyromegaly present.  Cardiovascular: Normal rate and regular rhythm.   Pulmonary/Chest: Breath sounds normal. No accessory muscle usage. No tachypnea. No respiratory distress. She has no decreased breath sounds. She has no wheezes. She has no rhonchi. Right breast exhibits no inverted nipple, no mass, no nipple discharge and no tenderness (no axillary adenopathy). Left breast exhibits no inverted nipple, no mass, no nipple discharge and no tenderness (no axilarry adenopathy).  Abdominal: Soft. Bowel sounds are normal. There is no tenderness.  Musculoskeletal: She exhibits no edema or tenderness.  Lymphadenopathy:    She has no cervical adenopathy.  Neurological: She is alert and oriented to person, place, and time.  Skin: Skin is warm. No rash noted. No erythema.  Psychiatric: She has a normal  mood and affect. Her behavior is normal.    BP 100/60 mmHg  Pulse 70  Temp(Src) 97.5 F (36.4 C) (Oral)  Resp 18  Ht 5' 1.25" (1.556 m)  Wt 216 lb (97.977 kg)  BMI 40.47 kg/m2  SpO2 98% Wt Readings from Last 3 Encounters:  08/01/15 216 lb (97.977 kg)  05/21/15 218 lb (98.884 kg)  08/29/14 208 lb 4 oz (94.462 kg)     Lab Results  Component Value Date   WBC 3.1 10/14/2014   HGB 9.4* 10/14/2014   HCT 27* 10/14/2014   PLT 352 10/14/2014       Assessment & Plan:   Problem List Items Addressed This Visit    Anemia in chronic kidney disease    Recheck cbc with  next labs.        Chronic kidney disease    S/p transplant.  Just evaluated by nephrology.  Medications adjusted.  Check metabolic panel.       Edema, lower extremity    Compression hose.  Better with adjustments in her medications.  Follow.       Essential thrombocytosis ([place])    Was followed by Dr Ma Hillock.  Has been stable.        Health care maintenance    Physical today 08/02/15.  Mammogram 09/17/14 - Birads I.  Colonoscopy (question of 2010).        Knee pain    Bilateral knee pain.  Persistent.  Had questions about synvisc.  Has seen ortho previously.  Request referral for reevaluation and to discuss treatment options.        Relevant Orders   Ambulatory referral to Orthopedic Surgery   Obstructive sleep apnea    Using CPAP.  Stable.        SOB (shortness of breath) - Primary    Just had echo.  Given persistent, will have cardiology evaluate to confirm no further heart testing warranted.  Wants to see Dr Hinderlighter.        Relevant Orders   Ambulatory referral to Cardiology    Other Visit Diagnoses    Breast cancer screening        Relevant Orders    MM DIGITAL SCREENING BILATERAL        Einar Pheasant, MD

## 2015-08-01 NOTE — Progress Notes (Signed)
Pre-visit discussion using our clinic review tool. No additional management support is needed unless otherwise documented below in the visit note.  

## 2015-08-03 ENCOUNTER — Encounter: Payer: Self-pay | Admitting: Internal Medicine

## 2015-08-03 DIAGNOSIS — R0602 Shortness of breath: Secondary | ICD-10-CM | POA: Insufficient documentation

## 2015-08-03 NOTE — Assessment & Plan Note (Signed)
Compression hose.  Better with adjustments in her medications.  Follow.

## 2015-08-03 NOTE — Assessment & Plan Note (Signed)
Bilateral knee pain.  Persistent.  Had questions about synvisc.  Has seen ortho previously.  Request referral for reevaluation and to discuss treatment options.

## 2015-08-03 NOTE — Assessment & Plan Note (Signed)
Just had echo.  Given persistent, will have cardiology evaluate to confirm no further heart testing warranted.  Wants to see Dr Hinderlighter.

## 2015-08-03 NOTE — Assessment & Plan Note (Signed)
Using CPAP.  Stable.

## 2015-08-03 NOTE — Assessment & Plan Note (Signed)
Physical today 08/02/15.  Mammogram 09/17/14 - Birads I.  Colonoscopy (question of 2010).

## 2015-08-03 NOTE — Assessment & Plan Note (Signed)
Was followed by Dr Ma Hillock.  Has been stable.

## 2015-08-03 NOTE — Assessment & Plan Note (Signed)
Recheck cbc with next labs.   

## 2015-08-03 NOTE — Assessment & Plan Note (Signed)
S/p transplant.  Just evaluated by nephrology.  Medications adjusted.  Check metabolic panel.

## 2015-08-04 ENCOUNTER — Encounter: Payer: Self-pay | Admitting: Internal Medicine

## 2015-08-07 ENCOUNTER — Other Ambulatory Visit: Payer: Self-pay | Admitting: Internal Medicine

## 2015-08-07 DIAGNOSIS — D899 Disorder involving the immune mechanism, unspecified: Secondary | ICD-10-CM | POA: Diagnosis not present

## 2015-08-07 DIAGNOSIS — D649 Anemia, unspecified: Secondary | ICD-10-CM | POA: Diagnosis not present

## 2015-08-07 DIAGNOSIS — Z940 Kidney transplant status: Secondary | ICD-10-CM | POA: Diagnosis not present

## 2015-08-07 DIAGNOSIS — N189 Chronic kidney disease, unspecified: Secondary | ICD-10-CM | POA: Diagnosis not present

## 2015-08-08 LAB — HEPATIC FUNCTION PANEL
ALBUMIN: 4.3 g/dL (ref 3.6–4.8)
ALT: 9 IU/L (ref 0–32)
AST: 10 IU/L (ref 0–40)
Alkaline Phosphatase: 46 IU/L (ref 39–117)
Bilirubin Total: 0.4 mg/dL (ref 0.0–1.2)
Bilirubin, Direct: 0.14 mg/dL (ref 0.00–0.40)
Total Protein: 6.5 g/dL (ref 6.0–8.5)

## 2015-08-08 LAB — CBC WITH DIFFERENTIAL/PLATELET
BASOS: 0 %
Basophils Absolute: 0 10*3/uL (ref 0.0–0.2)
EOS (ABSOLUTE): 0 10*3/uL (ref 0.0–0.4)
EOS: 1 %
HEMATOCRIT: 33.5 % — AB (ref 34.0–46.6)
HEMOGLOBIN: 11.6 g/dL (ref 11.1–15.9)
IMMATURE GRANS (ABS): 0 10*3/uL (ref 0.0–0.1)
IMMATURE GRANULOCYTES: 1 %
LYMPHS: 10 %
Lymphocytes Absolute: 0.6 10*3/uL — ABNORMAL LOW (ref 0.7–3.1)
MCH: 38.7 pg — ABNORMAL HIGH (ref 26.6–33.0)
MCHC: 34.6 g/dL (ref 31.5–35.7)
MCV: 112 fL — AB (ref 79–97)
Monocytes Absolute: 0.4 10*3/uL (ref 0.1–0.9)
Monocytes: 6 %
NEUTROS PCT: 82 %
Neutrophils Absolute: 4.8 10*3/uL (ref 1.4–7.0)
PLATELETS: 451 10*3/uL — AB (ref 150–379)
RBC: 3 x10E6/uL — ABNORMAL LOW (ref 3.77–5.28)
RDW: 13.2 % (ref 12.3–15.4)
WBC: 5.8 10*3/uL (ref 3.4–10.8)

## 2015-08-08 LAB — BASIC METABOLIC PANEL
BUN/Creatinine Ratio: 31 — ABNORMAL HIGH (ref 11–26)
BUN: 58 mg/dL — AB (ref 8–27)
CALCIUM: 9.9 mg/dL (ref 8.7–10.3)
CHLORIDE: 103 mmol/L (ref 96–106)
CO2: 17 mmol/L — ABNORMAL LOW (ref 18–29)
Creatinine, Ser: 1.85 mg/dL — ABNORMAL HIGH (ref 0.57–1.00)
GFR, EST AFRICAN AMERICAN: 32 mL/min/{1.73_m2} — AB (ref >59)
GFR, EST NON AFRICAN AMERICAN: 28 mL/min/{1.73_m2} — AB (ref >59)
Glucose: 142 mg/dL — ABNORMAL HIGH (ref 65–99)
Potassium: 4.7 mmol/L (ref 3.5–5.2)
Sodium: 140 mmol/L (ref 134–144)

## 2015-08-08 LAB — LIPID PANEL W/O CHOL/HDL RATIO
Cholesterol, Total: 168 mg/dL (ref 100–199)
HDL: 32 mg/dL — AB (ref >39)
LDL Calculated: 107 mg/dL — ABNORMAL HIGH (ref 0–99)
Triglycerides: 144 mg/dL (ref 0–149)
VLDL CHOLESTEROL CAL: 29 mg/dL (ref 5–40)

## 2015-08-08 LAB — TSH: TSH: 4.4 u[IU]/mL (ref 0.450–4.500)

## 2015-08-08 LAB — FERRITIN: FERRITIN: 316 ng/mL — AB (ref 15–150)

## 2015-08-12 DIAGNOSIS — Z940 Kidney transplant status: Secondary | ICD-10-CM | POA: Diagnosis not present

## 2015-08-12 DIAGNOSIS — D899 Disorder involving the immune mechanism, unspecified: Secondary | ICD-10-CM | POA: Diagnosis not present

## 2015-08-15 DIAGNOSIS — N179 Acute kidney failure, unspecified: Secondary | ICD-10-CM | POA: Diagnosis not present

## 2015-08-18 DIAGNOSIS — Z79899 Other long term (current) drug therapy: Secondary | ICD-10-CM | POA: Diagnosis not present

## 2015-08-18 DIAGNOSIS — T8610 Unspecified complication of kidney transplant: Secondary | ICD-10-CM | POA: Diagnosis not present

## 2015-08-18 DIAGNOSIS — E559 Vitamin D deficiency, unspecified: Secondary | ICD-10-CM | POA: Diagnosis not present

## 2015-08-18 DIAGNOSIS — D899 Disorder involving the immune mechanism, unspecified: Secondary | ICD-10-CM | POA: Diagnosis not present

## 2015-08-18 DIAGNOSIS — Z789 Other specified health status: Secondary | ICD-10-CM | POA: Diagnosis not present

## 2015-08-18 DIAGNOSIS — E1129 Type 2 diabetes mellitus with other diabetic kidney complication: Secondary | ICD-10-CM | POA: Diagnosis not present

## 2015-08-18 DIAGNOSIS — Z09 Encounter for follow-up examination after completed treatment for conditions other than malignant neoplasm: Secondary | ICD-10-CM | POA: Diagnosis not present

## 2015-08-18 DIAGNOSIS — Z940 Kidney transplant status: Secondary | ICD-10-CM | POA: Diagnosis not present

## 2015-08-18 DIAGNOSIS — D631 Anemia in chronic kidney disease: Secondary | ICD-10-CM | POA: Diagnosis not present

## 2015-08-18 DIAGNOSIS — Z7983 Long term (current) use of bisphosphonates: Secondary | ICD-10-CM | POA: Diagnosis not present

## 2015-08-18 DIAGNOSIS — N390 Urinary tract infection, site not specified: Secondary | ICD-10-CM | POA: Diagnosis not present

## 2015-09-01 DIAGNOSIS — Z940 Kidney transplant status: Secondary | ICD-10-CM | POA: Diagnosis not present

## 2015-09-01 DIAGNOSIS — D899 Disorder involving the immune mechanism, unspecified: Secondary | ICD-10-CM | POA: Diagnosis not present

## 2015-09-02 DIAGNOSIS — M25461 Effusion, right knee: Secondary | ICD-10-CM | POA: Diagnosis not present

## 2015-09-02 DIAGNOSIS — M170 Bilateral primary osteoarthritis of knee: Secondary | ICD-10-CM | POA: Diagnosis not present

## 2015-09-02 DIAGNOSIS — D473 Essential (hemorrhagic) thrombocythemia: Secondary | ICD-10-CM | POA: Diagnosis not present

## 2015-09-02 DIAGNOSIS — M25462 Effusion, left knee: Secondary | ICD-10-CM | POA: Diagnosis not present

## 2015-09-05 DIAGNOSIS — I509 Heart failure, unspecified: Secondary | ICD-10-CM | POA: Diagnosis not present

## 2015-09-05 DIAGNOSIS — Z6839 Body mass index (BMI) 39.0-39.9, adult: Secondary | ICD-10-CM | POA: Diagnosis not present

## 2015-09-05 DIAGNOSIS — N2889 Other specified disorders of kidney and ureter: Secondary | ICD-10-CM | POA: Diagnosis not present

## 2015-09-05 DIAGNOSIS — R0602 Shortness of breath: Secondary | ICD-10-CM | POA: Diagnosis not present

## 2015-09-05 DIAGNOSIS — I10 Essential (primary) hypertension: Secondary | ICD-10-CM | POA: Diagnosis not present

## 2015-09-05 DIAGNOSIS — I151 Hypertension secondary to other renal disorders: Secondary | ICD-10-CM | POA: Diagnosis not present

## 2015-09-05 DIAGNOSIS — Z940 Kidney transplant status: Secondary | ICD-10-CM | POA: Diagnosis not present

## 2015-09-05 DIAGNOSIS — Z79899 Other long term (current) drug therapy: Secondary | ICD-10-CM | POA: Diagnosis not present

## 2015-09-08 ENCOUNTER — Other Ambulatory Visit: Payer: Self-pay | Admitting: Internal Medicine

## 2015-09-08 DIAGNOSIS — Z940 Kidney transplant status: Secondary | ICD-10-CM | POA: Diagnosis not present

## 2015-09-08 DIAGNOSIS — D649 Anemia, unspecified: Secondary | ICD-10-CM | POA: Diagnosis not present

## 2015-09-08 DIAGNOSIS — D899 Disorder involving the immune mechanism, unspecified: Secondary | ICD-10-CM | POA: Diagnosis not present

## 2015-09-09 LAB — CBC WITH DIFFERENTIAL/PLATELET
BASOS ABS: 0 10*3/uL (ref 0.0–0.2)
BASOS: 0 %
EOS (ABSOLUTE): 0 10*3/uL (ref 0.0–0.4)
Eos: 1 %
Hematocrit: 32.7 % — ABNORMAL LOW (ref 34.0–46.6)
Hemoglobin: 11.3 g/dL (ref 11.1–15.9)
IMMATURE GRANS (ABS): 0 10*3/uL (ref 0.0–0.1)
IMMATURE GRANULOCYTES: 0 %
LYMPHS: 11 %
Lymphocytes Absolute: 0.6 10*3/uL — ABNORMAL LOW (ref 0.7–3.1)
MCH: 38.6 pg — AB (ref 26.6–33.0)
MCHC: 34.6 g/dL (ref 31.5–35.7)
MCV: 112 fL — AB (ref 79–97)
Monocytes Absolute: 0.3 10*3/uL (ref 0.1–0.9)
Monocytes: 5 %
NEUTROS PCT: 83 %
Neutrophils Absolute: 4.4 10*3/uL (ref 1.4–7.0)
Platelets: 425 10*3/uL — ABNORMAL HIGH (ref 150–379)
RBC: 2.93 x10E6/uL — ABNORMAL LOW (ref 3.77–5.28)
RDW: 13.1 % (ref 12.3–15.4)
WBC: 5.3 10*3/uL (ref 3.4–10.8)

## 2015-09-09 LAB — IRON AND TIBC
Iron Saturation: 34 % (ref 15–55)
Iron: 84 ug/dL (ref 27–139)
TIBC: 248 ug/dL — AB (ref 250–450)
UIBC: 164 ug/dL (ref 118–369)

## 2015-09-09 LAB — FERRITIN: FERRITIN: 228 ng/mL — AB (ref 15–150)

## 2015-09-11 ENCOUNTER — Encounter: Payer: Self-pay | Admitting: Internal Medicine

## 2015-09-11 DIAGNOSIS — M170 Bilateral primary osteoarthritis of knee: Secondary | ICD-10-CM | POA: Diagnosis not present

## 2015-09-18 ENCOUNTER — Ambulatory Visit
Admission: RE | Admit: 2015-09-18 | Discharge: 2015-09-18 | Disposition: A | Discharge status: Home / Self Care | Payer: Medicare Other | Source: Ambulatory Visit | Attending: Internal Medicine | Admitting: Internal Medicine

## 2015-09-18 DIAGNOSIS — M1711 Unilateral primary osteoarthritis, right knee: Secondary | ICD-10-CM | POA: Diagnosis not present

## 2015-09-18 DIAGNOSIS — M1712 Unilateral primary osteoarthritis, left knee: Secondary | ICD-10-CM | POA: Diagnosis not present

## 2015-09-18 DIAGNOSIS — Z1239 Encounter for other screening for malignant neoplasm of breast: Secondary | ICD-10-CM

## 2015-09-19 ENCOUNTER — Ambulatory Visit
Admission: RE | Admit: 2015-09-19 | Discharge: 2015-09-19 | Disposition: A | Discharge status: Home / Self Care | Payer: Medicare Other | Source: Ambulatory Visit | Attending: Internal Medicine | Admitting: Internal Medicine

## 2015-09-19 ENCOUNTER — Other Ambulatory Visit: Payer: Self-pay | Admitting: Internal Medicine

## 2015-09-19 DIAGNOSIS — Z1231 Encounter for screening mammogram for malignant neoplasm of breast: Secondary | ICD-10-CM | POA: Diagnosis not present

## 2015-09-19 DIAGNOSIS — I509 Heart failure, unspecified: Secondary | ICD-10-CM | POA: Diagnosis not present

## 2015-09-19 DIAGNOSIS — D899 Disorder involving the immune mechanism, unspecified: Secondary | ICD-10-CM | POA: Diagnosis not present

## 2015-09-19 DIAGNOSIS — Z1239 Encounter for other screening for malignant neoplasm of breast: Secondary | ICD-10-CM

## 2015-09-19 DIAGNOSIS — Z940 Kidney transplant status: Secondary | ICD-10-CM | POA: Diagnosis not present

## 2015-09-19 DIAGNOSIS — Z79899 Other long term (current) drug therapy: Secondary | ICD-10-CM | POA: Diagnosis not present

## 2015-09-26 DIAGNOSIS — Z940 Kidney transplant status: Secondary | ICD-10-CM | POA: Diagnosis not present

## 2015-09-26 DIAGNOSIS — D899 Disorder involving the immune mechanism, unspecified: Secondary | ICD-10-CM | POA: Diagnosis not present

## 2015-10-03 DIAGNOSIS — Z6839 Body mass index (BMI) 39.0-39.9, adult: Secondary | ICD-10-CM | POA: Diagnosis not present

## 2015-10-03 DIAGNOSIS — I10 Essential (primary) hypertension: Secondary | ICD-10-CM | POA: Diagnosis not present

## 2015-10-03 DIAGNOSIS — I509 Heart failure, unspecified: Secondary | ICD-10-CM | POA: Diagnosis not present

## 2015-10-10 DIAGNOSIS — Z940 Kidney transplant status: Secondary | ICD-10-CM | POA: Diagnosis not present

## 2015-10-10 DIAGNOSIS — D899 Disorder involving the immune mechanism, unspecified: Secondary | ICD-10-CM | POA: Diagnosis not present

## 2015-10-10 DIAGNOSIS — Z79899 Other long term (current) drug therapy: Secondary | ICD-10-CM | POA: Diagnosis not present

## 2015-10-21 DIAGNOSIS — H2513 Age-related nuclear cataract, bilateral: Secondary | ICD-10-CM | POA: Diagnosis not present

## 2015-10-22 DIAGNOSIS — D899 Disorder involving the immune mechanism, unspecified: Secondary | ICD-10-CM | POA: Diagnosis not present

## 2015-10-22 DIAGNOSIS — I272 Other secondary pulmonary hypertension: Secondary | ICD-10-CM | POA: Diagnosis not present

## 2015-10-22 DIAGNOSIS — Z940 Kidney transplant status: Secondary | ICD-10-CM | POA: Diagnosis not present

## 2015-10-22 DIAGNOSIS — Z79899 Other long term (current) drug therapy: Secondary | ICD-10-CM | POA: Diagnosis not present

## 2015-10-22 DIAGNOSIS — D473 Essential (hemorrhagic) thrombocythemia: Secondary | ICD-10-CM | POA: Diagnosis not present

## 2015-10-22 DIAGNOSIS — Z6839 Body mass index (BMI) 39.0-39.9, adult: Secondary | ICD-10-CM | POA: Diagnosis not present

## 2015-10-22 DIAGNOSIS — Z48298 Encounter for aftercare following other organ transplant: Secondary | ICD-10-CM | POA: Diagnosis not present

## 2015-10-22 DIAGNOSIS — I509 Heart failure, unspecified: Secondary | ICD-10-CM | POA: Diagnosis not present

## 2015-11-04 ENCOUNTER — Ambulatory Visit: Payer: Medicare Other | Admitting: Internal Medicine

## 2015-11-07 DIAGNOSIS — J301 Allergic rhinitis due to pollen: Secondary | ICD-10-CM | POA: Diagnosis not present

## 2015-11-07 DIAGNOSIS — I509 Heart failure, unspecified: Secondary | ICD-10-CM | POA: Diagnosis not present

## 2015-11-07 DIAGNOSIS — Z6839 Body mass index (BMI) 39.0-39.9, adult: Secondary | ICD-10-CM | POA: Diagnosis not present

## 2015-11-13 DIAGNOSIS — D899 Disorder involving the immune mechanism, unspecified: Secondary | ICD-10-CM | POA: Diagnosis not present

## 2015-11-13 DIAGNOSIS — Z79899 Other long term (current) drug therapy: Secondary | ICD-10-CM | POA: Diagnosis not present

## 2015-11-13 DIAGNOSIS — D631 Anemia in chronic kidney disease: Secondary | ICD-10-CM | POA: Diagnosis not present

## 2015-11-13 DIAGNOSIS — Z940 Kidney transplant status: Secondary | ICD-10-CM | POA: Diagnosis not present

## 2015-11-13 DIAGNOSIS — N189 Chronic kidney disease, unspecified: Secondary | ICD-10-CM | POA: Diagnosis not present

## 2015-12-26 DIAGNOSIS — Z940 Kidney transplant status: Secondary | ICD-10-CM | POA: Diagnosis not present

## 2015-12-26 DIAGNOSIS — D899 Disorder involving the immune mechanism, unspecified: Secondary | ICD-10-CM | POA: Diagnosis not present

## 2015-12-26 DIAGNOSIS — Z79899 Other long term (current) drug therapy: Secondary | ICD-10-CM | POA: Diagnosis not present

## 2016-02-18 DIAGNOSIS — Z940 Kidney transplant status: Secondary | ICD-10-CM | POA: Diagnosis not present

## 2016-02-18 DIAGNOSIS — Z48298 Encounter for aftercare following other organ transplant: Secondary | ICD-10-CM | POA: Diagnosis not present

## 2016-02-18 DIAGNOSIS — I10 Essential (primary) hypertension: Secondary | ICD-10-CM | POA: Diagnosis not present

## 2016-02-18 DIAGNOSIS — I509 Heart failure, unspecified: Secondary | ICD-10-CM | POA: Diagnosis not present

## 2016-02-18 DIAGNOSIS — Z4822 Encounter for aftercare following kidney transplant: Secondary | ICD-10-CM | POA: Diagnosis not present

## 2016-02-18 DIAGNOSIS — D473 Essential (hemorrhagic) thrombocythemia: Secondary | ICD-10-CM | POA: Diagnosis not present

## 2016-02-18 DIAGNOSIS — I272 Other secondary pulmonary hypertension: Secondary | ICD-10-CM | POA: Diagnosis not present

## 2016-02-18 DIAGNOSIS — D899 Disorder involving the immune mechanism, unspecified: Secondary | ICD-10-CM | POA: Diagnosis not present

## 2016-02-18 DIAGNOSIS — I5030 Unspecified diastolic (congestive) heart failure: Secondary | ICD-10-CM | POA: Diagnosis not present

## 2016-02-18 DIAGNOSIS — Z79899 Other long term (current) drug therapy: Secondary | ICD-10-CM | POA: Diagnosis not present

## 2016-02-18 DIAGNOSIS — J449 Chronic obstructive pulmonary disease, unspecified: Secondary | ICD-10-CM | POA: Diagnosis not present

## 2016-02-18 DIAGNOSIS — M109 Gout, unspecified: Secondary | ICD-10-CM | POA: Diagnosis not present

## 2016-02-20 ENCOUNTER — Encounter: Payer: Self-pay | Admitting: Internal Medicine

## 2016-02-26 DIAGNOSIS — R0600 Dyspnea, unspecified: Secondary | ICD-10-CM | POA: Diagnosis not present

## 2016-03-12 ENCOUNTER — Ambulatory Visit: Payer: Medicare Other | Admitting: Internal Medicine

## 2016-04-06 DIAGNOSIS — Z940 Kidney transplant status: Secondary | ICD-10-CM | POA: Diagnosis not present

## 2016-04-06 DIAGNOSIS — D899 Disorder involving the immune mechanism, unspecified: Secondary | ICD-10-CM | POA: Diagnosis not present

## 2016-04-06 DIAGNOSIS — D500 Iron deficiency anemia secondary to blood loss (chronic): Secondary | ICD-10-CM | POA: Diagnosis not present

## 2016-04-06 DIAGNOSIS — Z79899 Other long term (current) drug therapy: Secondary | ICD-10-CM | POA: Diagnosis not present

## 2016-04-20 ENCOUNTER — Ambulatory Visit: Payer: Medicare Other | Admitting: Internal Medicine

## 2016-05-04 DIAGNOSIS — I120 Hypertensive chronic kidney disease with stage 5 chronic kidney disease or end stage renal disease: Secondary | ICD-10-CM | POA: Diagnosis not present

## 2016-05-04 DIAGNOSIS — Z992 Dependence on renal dialysis: Secondary | ICD-10-CM | POA: Diagnosis not present

## 2016-05-04 DIAGNOSIS — I5030 Unspecified diastolic (congestive) heart failure: Secondary | ICD-10-CM | POA: Diagnosis not present

## 2016-05-04 DIAGNOSIS — N186 End stage renal disease: Secondary | ICD-10-CM | POA: Diagnosis not present

## 2016-05-04 DIAGNOSIS — D473 Essential (hemorrhagic) thrombocythemia: Secondary | ICD-10-CM | POA: Diagnosis not present

## 2016-05-04 DIAGNOSIS — Z23 Encounter for immunization: Secondary | ICD-10-CM | POA: Diagnosis not present

## 2016-05-04 DIAGNOSIS — R195 Other fecal abnormalities: Secondary | ICD-10-CM | POA: Diagnosis not present

## 2016-05-04 DIAGNOSIS — I272 Pulmonary hypertension, unspecified: Secondary | ICD-10-CM | POA: Diagnosis not present

## 2016-05-04 DIAGNOSIS — Z6841 Body Mass Index (BMI) 40.0 and over, adult: Secondary | ICD-10-CM | POA: Diagnosis not present

## 2016-05-04 DIAGNOSIS — D631 Anemia in chronic kidney disease: Secondary | ICD-10-CM | POA: Diagnosis not present

## 2016-05-20 ENCOUNTER — Ambulatory Visit: Payer: Medicare Other

## 2016-06-02 DIAGNOSIS — N3000 Acute cystitis without hematuria: Secondary | ICD-10-CM | POA: Diagnosis not present

## 2016-06-02 DIAGNOSIS — I5030 Unspecified diastolic (congestive) heart failure: Secondary | ICD-10-CM | POA: Diagnosis not present

## 2016-06-02 DIAGNOSIS — J029 Acute pharyngitis, unspecified: Secondary | ICD-10-CM | POA: Diagnosis not present

## 2016-06-02 DIAGNOSIS — N189 Chronic kidney disease, unspecified: Secondary | ICD-10-CM | POA: Diagnosis not present

## 2016-06-02 DIAGNOSIS — I130 Hypertensive heart and chronic kidney disease with heart failure and stage 1 through stage 4 chronic kidney disease, or unspecified chronic kidney disease: Secondary | ICD-10-CM | POA: Diagnosis not present

## 2016-06-02 DIAGNOSIS — R509 Fever, unspecified: Secondary | ICD-10-CM | POA: Diagnosis not present

## 2016-06-02 DIAGNOSIS — Z87891 Personal history of nicotine dependence: Secondary | ICD-10-CM | POA: Diagnosis not present

## 2016-06-02 DIAGNOSIS — R0602 Shortness of breath: Secondary | ICD-10-CM | POA: Diagnosis not present

## 2016-06-02 DIAGNOSIS — M791 Myalgia: Secondary | ICD-10-CM | POA: Diagnosis not present

## 2016-06-02 DIAGNOSIS — Z79899 Other long term (current) drug therapy: Secondary | ICD-10-CM | POA: Diagnosis not present

## 2016-06-02 DIAGNOSIS — J069 Acute upper respiratory infection, unspecified: Secondary | ICD-10-CM | POA: Diagnosis not present

## 2016-06-08 DIAGNOSIS — D473 Essential (hemorrhagic) thrombocythemia: Secondary | ICD-10-CM | POA: Diagnosis not present

## 2016-06-08 DIAGNOSIS — Z79899 Other long term (current) drug therapy: Secondary | ICD-10-CM | POA: Diagnosis not present

## 2016-06-08 DIAGNOSIS — I272 Pulmonary hypertension, unspecified: Secondary | ICD-10-CM | POA: Diagnosis not present

## 2016-06-08 DIAGNOSIS — Z940 Kidney transplant status: Secondary | ICD-10-CM | POA: Diagnosis not present

## 2016-06-08 DIAGNOSIS — Z48298 Encounter for aftercare following other organ transplant: Secondary | ICD-10-CM | POA: Diagnosis not present

## 2016-06-08 DIAGNOSIS — N271 Small kidney, bilateral: Secondary | ICD-10-CM | POA: Diagnosis not present

## 2016-06-08 DIAGNOSIS — Z6841 Body Mass Index (BMI) 40.0 and over, adult: Secondary | ICD-10-CM | POA: Diagnosis not present

## 2016-06-08 DIAGNOSIS — N270 Small kidney, unilateral: Secondary | ICD-10-CM | POA: Diagnosis not present

## 2016-06-14 ENCOUNTER — Telehealth: Payer: Self-pay | Admitting: Internal Medicine

## 2016-06-14 NOTE — Telephone Encounter (Signed)
Pt states it's not neccessary for her to the AWV. Thank You!

## 2016-06-16 ENCOUNTER — Encounter: Payer: Self-pay | Admitting: Internal Medicine

## 2016-06-16 ENCOUNTER — Ambulatory Visit (INDEPENDENT_AMBULATORY_CARE_PROVIDER_SITE_OTHER): Payer: Medicare Other | Admitting: Internal Medicine

## 2016-06-16 VITALS — BP 124/58 | HR 65 | Temp 97.6°F | Ht 61.0 in | Wt 218.0 lb

## 2016-06-16 DIAGNOSIS — D631 Anemia in chronic kidney disease: Secondary | ICD-10-CM

## 2016-06-16 DIAGNOSIS — N3000 Acute cystitis without hematuria: Secondary | ICD-10-CM | POA: Diagnosis not present

## 2016-06-16 DIAGNOSIS — D473 Essential (hemorrhagic) thrombocythemia: Secondary | ICD-10-CM | POA: Diagnosis not present

## 2016-06-16 DIAGNOSIS — N189 Chronic kidney disease, unspecified: Secondary | ICD-10-CM

## 2016-06-16 DIAGNOSIS — G4733 Obstructive sleep apnea (adult) (pediatric): Secondary | ICD-10-CM | POA: Diagnosis not present

## 2016-06-16 DIAGNOSIS — J069 Acute upper respiratory infection, unspecified: Secondary | ICD-10-CM | POA: Diagnosis not present

## 2016-06-16 LAB — URINALYSIS, ROUTINE W REFLEX MICROSCOPIC
BILIRUBIN URINE: NEGATIVE
Hgb urine dipstick: NEGATIVE
KETONES UR: NEGATIVE
Leukocytes, UA: NEGATIVE
NITRITE: NEGATIVE
RBC / HPF: NONE SEEN (ref 0–?)
Specific Gravity, Urine: 1.01 (ref 1.000–1.030)
Total Protein, Urine: NEGATIVE
UROBILINOGEN UA: 0.2 (ref 0.0–1.0)
Urine Glucose: NEGATIVE
WBC UA: NONE SEEN (ref 0–?)
pH: 5.5 (ref 5.0–8.0)

## 2016-06-16 NOTE — Progress Notes (Signed)
Pre visit review using our clinic review tool, if applicable. No additional management support is needed unless otherwise documented below in the visit note. 

## 2016-06-16 NOTE — Progress Notes (Signed)
Patient ID: Diane Guerra, female   DOB: 1950-08-25, 65 y.o.   MRN: [id number]   Subjective:    Patient ID: Diane Guerra, female    DOB: [birth year], 65 y.o.   MRN: [id number]  HPI  Patient here for a scheduled follow up. She has documented CKD and is s/p kidney transplant 03/2013.  She also has a history of hypertension, heart failure with preserved EF and pulmonary hypertension.  She is accompanied by her husband.  History obtained from both of them.  She is followed by nephrology.  recentl started on eplerenone.  Tolerating.  No chest pain.  No sob.  No acid reflux.  No abdominal pain or cramping.  Bowels stable.  She was recently evaluated and diagnosed with uri and uti.  Placed on abx.  Completed zpak.  Did not tolerate macrobid.  Switched to cipro.  She is feeling better.  Eating.     Past Medical History:  Diagnosis Date  . Anemia   . Chronic kidney disease 2007   followed by Dr Juanito Doom  . Essential thrombocytosis ([place])   . Gout   . Kidney failure 2014  . Pulmonary edema 2014  . Sleep apnea with use of continuous positive airway pressure (CPAP)    Past Surgical History:  Procedure Laterality Date  . BREAST BIOPSY Left 1986   neg  . HAND SURGERY    . KIDNEY TRANSPLANT  [id number]   Family History  Problem Relation Age of Onset  . Kidney disease Father   . Heart disease Father     myocardial infarction  . Breast cancer Neg Hx    Social History   Social History  . Marital status: Married    Spouse name: N/A  . Number of children: N/A  . Years of education: N/A   Social History Main Topics  . Smoking status: Former Research scientist (life sciences)  . Smokeless tobacco: Never Used  . Alcohol use 0.0 oz/week     Comment: has an occasional galss of wine  . Drug use: No  . Sexual activity: Yes   Other Topics Concern  . None   Social History Narrative  . None    Outpatient Encounter Prescriptions as of 06/16/2016  Medication Sig  . acetaminophen (TYLENOL) 325 MG tablet Take 650 mg by  mouth every 6 (six) hours as needed.  Marland Kitchen allopurinol (ZYLOPRIM) 100 MG tablet Take 100 mg by mouth 2 (two) times daily.   Marland Kitchen amLODipine (NORVASC) 5 MG tablet Take 5 mg by mouth daily.  Marland Kitchen aspirin 81 MG tablet Take 81 mg by mouth daily.  . carvedilol (COREG) 25 MG tablet Take 25 mg by mouth 2 (two) times daily with a meal.  . cholecalciferol (VITAMIN D) 1000 UNITS tablet Take 1,000 Units by mouth daily.  Marland Kitchen docusate sodium (COLACE) 100 MG capsule Take 100 mg by mouth daily as needed for mild constipation.  Marland Kitchen eplerenone (INSPRA) 25 MG tablet Take 12.5 mg by mouth daily.  . Ferrous Sulfate Dried 140 (45 FE) MG TBCR Take 1 tablet by mouth daily.  . fluticasone (FLONASE) 50 MCG/ACT nasal spray as needed.  . furosemide (LASIX) 20 MG tablet Take 40 mg by mouth daily.   . hydroxyurea (HYDREA) 500 MG capsule Take 500 mg by mouth 2 (two) times daily.   Marland Kitchen losartan (COZAAR) 50 MG tablet Take 50 mg by mouth 2 (two) times daily.  . Misc Natural Products (GLUCOSAMINE CHONDROITIN COMPLX) TABS Take 1 tablet by mouth 2 (two) times daily.  Marland Kitchen  MYFORTIC 180 MG EC tablet TAKE 2 TABLETS PO BID  . Omega-3 Fatty Acids (FISH OIL) 1000 MG CAPS Take 1 capsule by mouth daily.  Marland Kitchen rOPINIRole (REQUIP) 0.25 MG tablet Take 0.25 mg by mouth at bedtime as needed.  . sodium bicarbonate 650 MG tablet Take 650 mg by mouth 2 (two) times daily.  . tacrolimus (PROGRAF) 1 MG capsule Take 1.5 mg by mouth 2 (two) times daily.   Marland Kitchen zolpidem (AMBIEN) 5 MG tablet Take 1 tablet (5 mg total) by mouth at bedtime as needed for sleep.  . [DISCONTINUED] predniSONE (DELTASONE) 10 MG tablet Take 10 mg by mouth as needed. Reported on 08/01/2015  . [DISCONTINUED] spironolactone (ALDACTONE) 25 MG tablet Take 25 mg by mouth.  Marland Kitchen azelastine (ASTELIN) 0.1 % nasal spray as needed.   No facility-administered encounter medications on file as of 06/16/2016.     Review of Systems  Constitutional: Negative for appetite change and unexpected weight change.  HENT:  Negative for congestion and sinus pressure.   Respiratory: Negative for cough, chest tightness and shortness of breath.        URI symptoms improved.    Cardiovascular: Negative for chest pain, palpitations and leg swelling.  Gastrointestinal: Negative for abdominal pain, diarrhea, nausea and vomiting.  Genitourinary: Negative for difficulty urinating and dysuria.  Musculoskeletal: Negative for joint swelling and myalgias.  Skin: Negative for color change and rash.  Neurological: Negative for dizziness, light-headedness and headaches.  Psychiatric/Behavioral: Negative for agitation and dysphoric mood.       Objective:    Physical Exam  Constitutional: She appears well-developed and well-nourished. No distress.  HENT:  Nose: Nose normal.  Mouth/Throat: Oropharynx is clear and moist.  Neck: Neck supple. No thyromegaly present.  Cardiovascular: Normal rate and regular rhythm.   Pulmonary/Chest: Breath sounds normal. No respiratory distress. She has no wheezes.  Abdominal: Soft. Bowel sounds are normal. There is no tenderness.  Musculoskeletal: She exhibits no edema or tenderness.  Lymphadenopathy:    She has no cervical adenopathy.  Skin: No rash noted. No erythema.  Psychiatric: She has a normal mood and affect. Her behavior is normal.    BP (!) 124/58   Pulse 65   Temp 97.6 F (36.4 C) (Oral)   Ht 5\' 1"  (1.549 m)   Wt 218 lb (98.9 kg)   SpO2 99%   BMI 41.19 kg/m  Wt Readings from Last 3 Encounters:  06/16/16 218 lb (98.9 kg)  08/01/15 216 lb (98 kg)  05/21/15 218 lb (98.9 kg)     Lab Results  Component Value Date   WBC 5.3 09/08/2015   HGB 9.4 (A) 10/14/2014   HCT 32.7 (L) 09/08/2015   PLT 425 (H) 09/08/2015   GLUCOSE 142 (H) 08/07/2015   CHOL 168 08/07/2015   TRIG 144 08/07/2015   HDL 32 (L) 08/07/2015   LDLCALC 107 (H) 08/07/2015   ALT 9 08/07/2015   AST 10 08/07/2015   NA 140 08/07/2015   K 4.7 08/07/2015   CL 103 08/07/2015   CREATININE 1.85 (H)  08/07/2015   BUN 58 (H) 08/07/2015   CO2 17 (L) 08/07/2015   TSH 4.400 08/07/2015    Mm Digital Screening Bilateral  Result Date: 09/19/2015 CLINICAL DATA:  Screening. EXAM: DIGITAL SCREENING BILATERAL MAMMOGRAM WITH CAD COMPARISON:  Previous exam(s). ACR Breast Density Category c: The breast tissue is heterogeneously dense, which may obscure small masses. FINDINGS: There are no findings suspicious for malignancy. Images were processed with CAD. IMPRESSION:  No mammographic evidence of malignancy. A result letter of this screening mammogram will be mailed directly to the patient. RECOMMENDATION: Screening mammogram in one year. (Code:SM-B-01Y) BI-RADS CATEGORY  1: Negative. Electronically Signed   By: Ammie Ferrier M.D.   On: 09/19/2015 16:25       Assessment & Plan:   Problem List Items Addressed This Visit    Anemia in chronic kidney disease    Follow cbc.        Chronic kidney disease    S/p trransplant.  Followed by nephrology.  Follow metabolic panel.        Essential thrombocytosis ([place])    Was followed by Dr Ma Hillock.  Recheck cbc.        Obstructive sleep apnea    Using CPAP.  Wants another machine.  Has an old machine.        URI (upper respiratory infection)    Cough and congestion better.         Other Visit Diagnoses    Acute cystitis without hematuria    -  Primary   recently treated for uti.  completed cipro.  recheck urinalysis and culture to confirm no infection.  follow.     Relevant Orders   Urinalysis, Routine w reflex microscopic (Completed)   CULTURE, URINE COMPREHENSIVE (Completed)       Einar Pheasant, MD

## 2016-06-19 LAB — CULTURE, URINE COMPREHENSIVE

## 2016-06-20 ENCOUNTER — Encounter: Payer: Self-pay | Admitting: Internal Medicine

## 2016-06-20 NOTE — Assessment & Plan Note (Signed)
S/p trransplant.  Followed by nephrology.  Follow metabolic panel.

## 2016-06-20 NOTE — Assessment & Plan Note (Signed)
Using CPAP.  Wants another machine.  Has an old machine.

## 2016-06-20 NOTE — Assessment & Plan Note (Signed)
Was followed by Dr Ma Hillock.  Recheck cbc.

## 2016-06-20 NOTE — Assessment & Plan Note (Signed)
Cough and congestion better.

## 2016-06-20 NOTE — Assessment & Plan Note (Signed)
Follow cbc.  

## 2016-06-21 ENCOUNTER — Encounter: Payer: Self-pay | Admitting: Internal Medicine

## 2016-06-25 NOTE — Telephone Encounter (Signed)
Spoke to [employer]. They state that she is not eligible for a new cpap machine till 12/22/2016. The patient is aware. They have voided the order.

## 2016-06-28 DIAGNOSIS — R05 Cough: Secondary | ICD-10-CM | POA: Diagnosis not present

## 2016-06-28 DIAGNOSIS — R509 Fever, unspecified: Secondary | ICD-10-CM | POA: Diagnosis not present

## 2016-07-04 IMAGING — MG MM DIGITAL SCREENING BILATERAL
5 series · 5 of 5 positions shown · non-contrast
Comparison: Previous exam(s).

CLINICAL DATA: Screening.

EXAM:
DIGITAL SCREENING BILATERAL MAMMOGRAM WITH CAD

[L CC]
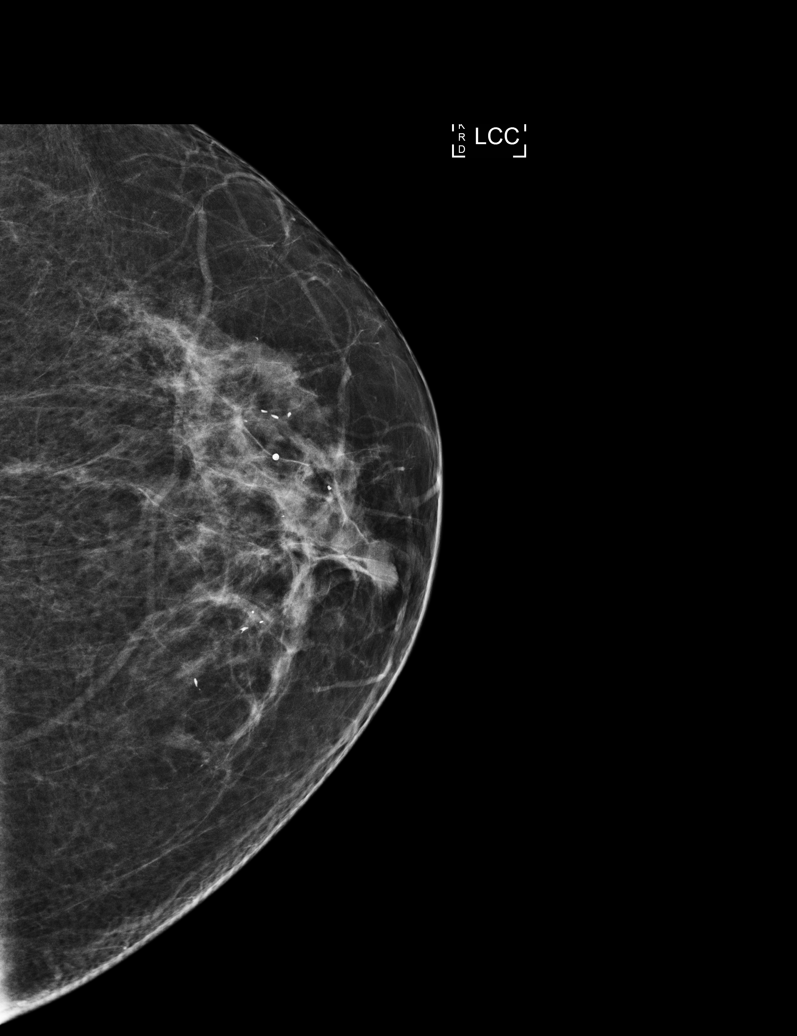

[R XCCL]
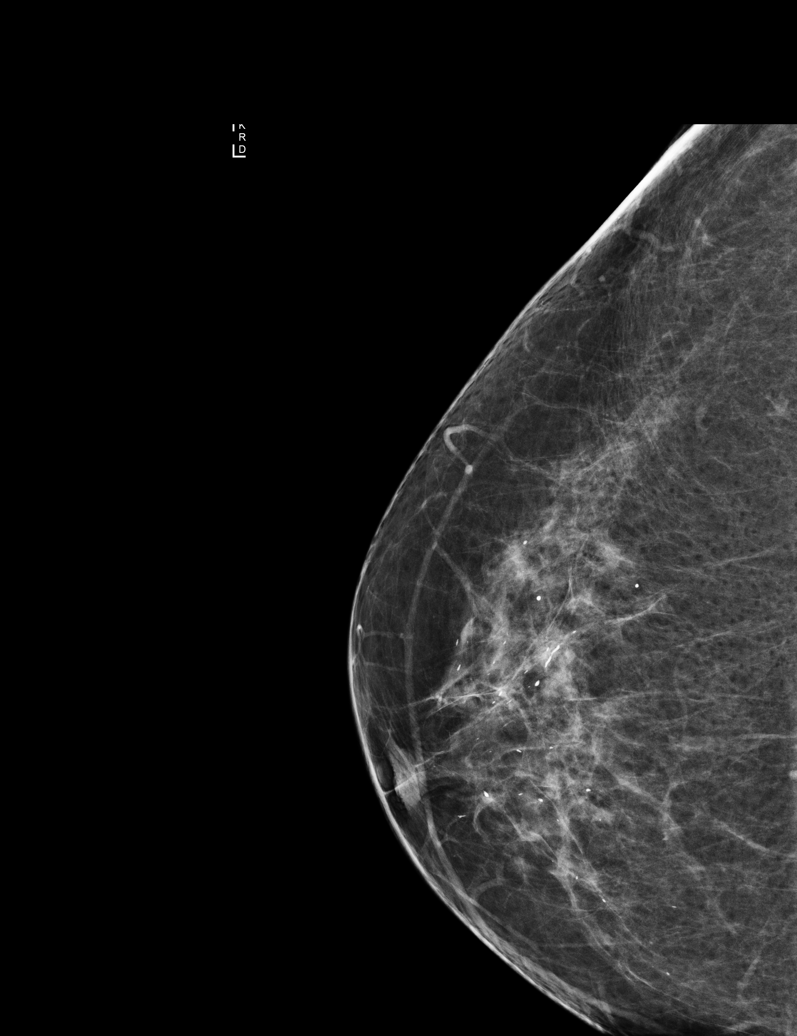

[L MLO]
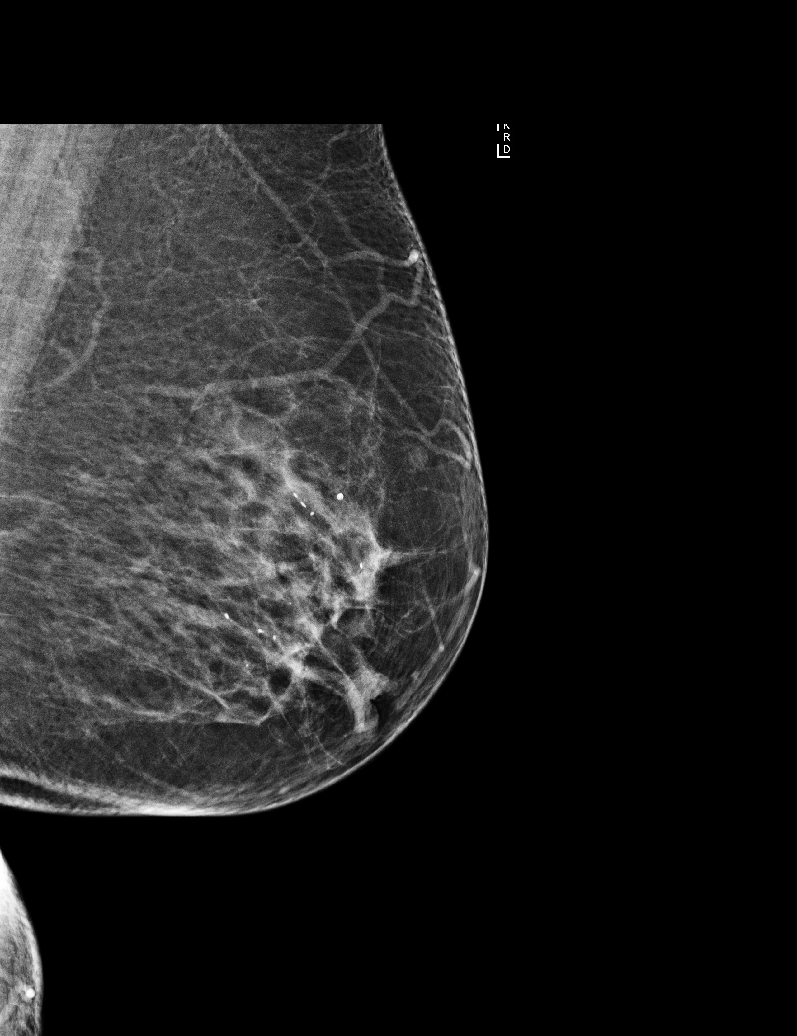

[R CC]
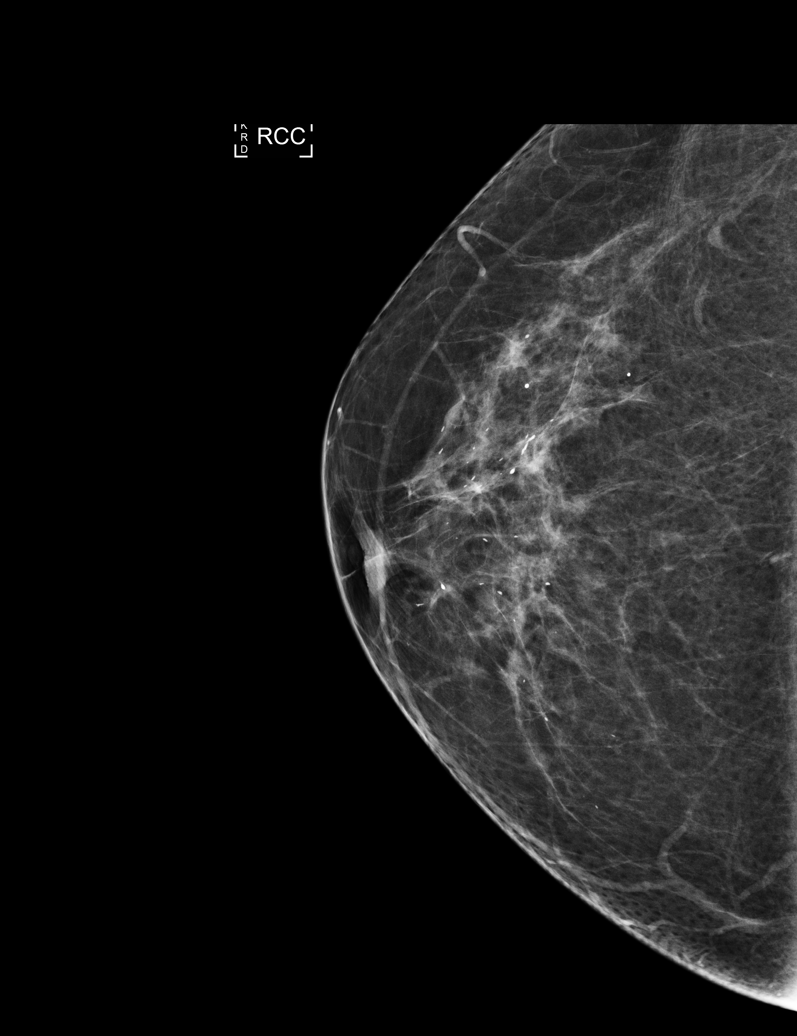

[R MLO]
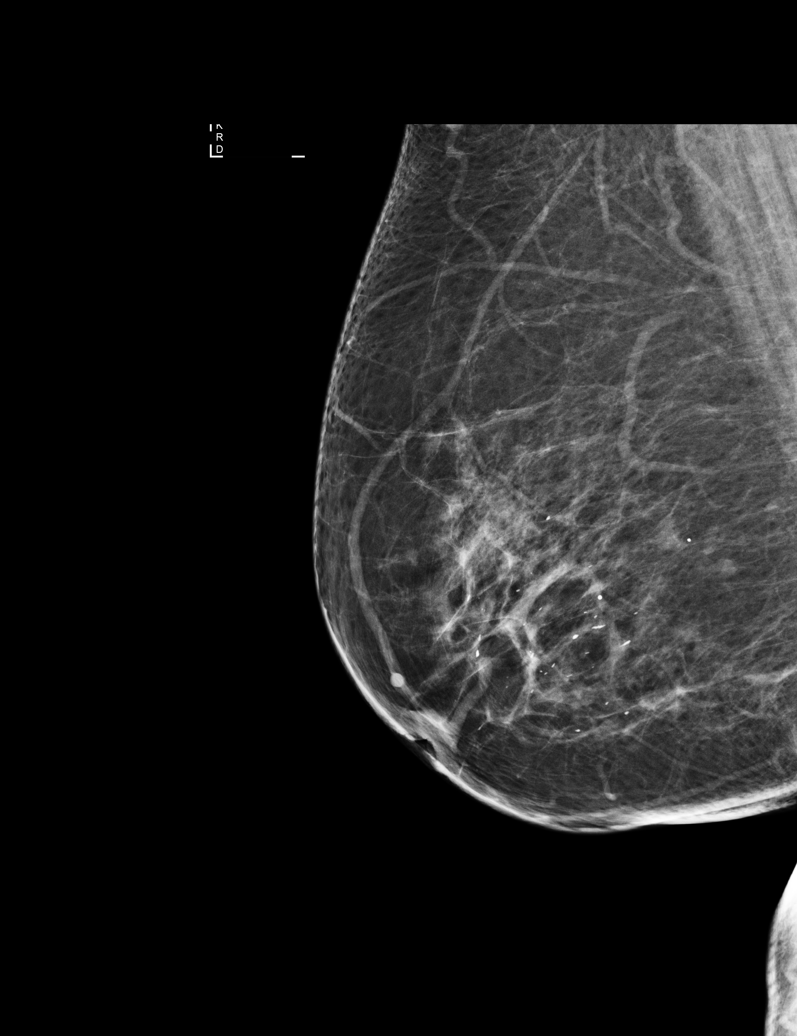

[5 of 5 positions shown; findings below may reference images not displayed]

ACR Breast Density Category c: The breast tissue is heterogeneously
dense, which may obscure small masses.
FINDINGS: There are no findings suspicious for malignancy. Images were
processed with CAD.
IMPRESSION: No mammographic evidence of malignancy. A result letter of this
screening mammogram will be mailed directly to the patient.

RECOMMENDATION:
Screening mammogram in one year. (Code:YJ-[id number])

BI-RADS CATEGORY  1: Negative.

## 2016-07-09 DIAGNOSIS — Z6838 Body mass index (BMI) 38.0-38.9, adult: Secondary | ICD-10-CM | POA: Diagnosis not present

## 2016-07-09 DIAGNOSIS — I5030 Unspecified diastolic (congestive) heart failure: Secondary | ICD-10-CM | POA: Diagnosis not present

## 2016-07-09 DIAGNOSIS — I10 Essential (primary) hypertension: Secondary | ICD-10-CM | POA: Diagnosis not present

## 2016-07-19 DIAGNOSIS — Z79899 Other long term (current) drug therapy: Secondary | ICD-10-CM | POA: Diagnosis not present

## 2016-07-19 DIAGNOSIS — Z4822 Encounter for aftercare following kidney transplant: Secondary | ICD-10-CM | POA: Diagnosis not present

## 2016-07-26 ENCOUNTER — Telehealth: Payer: Self-pay | Admitting: *Deleted

## 2016-07-26 NOTE — Telephone Encounter (Signed)
I gave Diane Guerra the form and she is going to try to call [facility]

## 2016-07-26 NOTE — Telephone Encounter (Signed)
Michelle from [facility] has requested to have pt's baseline PSG, pt requested Cpap supplies and this information is needed.  Contact [phone] Fax [phone]

## 2016-07-27 DIAGNOSIS — H2513 Age-related nuclear cataract, bilateral: Secondary | ICD-10-CM | POA: Diagnosis not present

## 2016-08-04 DIAGNOSIS — H2513 Age-related nuclear cataract, bilateral: Secondary | ICD-10-CM | POA: Diagnosis not present

## 2016-08-05 DIAGNOSIS — Z940 Kidney transplant status: Secondary | ICD-10-CM | POA: Diagnosis not present

## 2016-08-05 DIAGNOSIS — N390 Urinary tract infection, site not specified: Secondary | ICD-10-CM | POA: Diagnosis not present

## 2016-08-05 DIAGNOSIS — Z6837 Body mass index (BMI) 37.0-37.9, adult: Secondary | ICD-10-CM | POA: Diagnosis not present

## 2016-08-09 ENCOUNTER — Encounter: Payer: Self-pay | Admitting: *Deleted

## 2016-08-12 NOTE — Discharge Instructions (Signed)
Cataract Surgery, Care After °Refer to this sheet in the next few weeks. These instructions provide you with information about caring for yourself after your procedure. Your health care provider may also give you more specific instructions. Your treatment has been planned according to current medical practices, but problems sometimes occur. Call your health care provider if you have any problems or questions after your procedure. °What can I expect after the procedure? °After the procedure, it is common to have: °· Itching. °· Discomfort. °· Fluid discharge. °· Sensitivity to light and to touch. °· Bruising. °Follow these instructions at home: °Eye Care  °· Check your eye every day for signs of infection. Watch for: °¨ Redness, swelling, or pain. °¨ Fluid, blood, or pus. °¨ Warmth. °¨ Bad smell. °Activity  °· Avoid strenuous activities, such as playing contact sports, for as long as told by your health care provider. °· Do not drive or operate heavy machinery until your health care provider approves. °· Do not bend or lift heavy objects . Bending increases pressure in the eye. You can walk, climb stairs, and do light household chores. °· Ask your health care provider when you can return to work. If you work in a dusty environment, you may be advised to wear protective eyewear for a period of time. °General instructions  °· Take or apply over-the-counter and prescription medicines only as told by your health care provider. This includes eye drops. °· Do not touch or rub your eyes. °· If you were given a protective shield, wear it as told by your health care provider. If you were not given a protective shield, wear sunglasses as told by your health care provider to protect your eyes. °· Keep the area around your eye clean and dry. Avoid swimming or allowing water to hit you directly in the face while showering until told by your health care provider. Keep soap and shampoo out of your eyes. °· Do not put a contact lens  into the affected eye or eyes until your health care provider approves. °· Keep all follow-up visits as told by your health care provider. This is important. °Contact a health care provider if: ° °· You have increased bruising around your eye. °· You have pain that is not helped with medicine. °· You have a fever. °· You have redness, swelling, or pain in your eye. °· You have fluid, blood, or pus coming from your incision. °· Your vision gets worse. °Get help right away if: °· You have sudden vision loss. °This information is not intended to replace advice given to you by your health care provider. Make sure you discuss any questions you have with your health care provider. °Document Released: 01/15/2005 Document Revised: 11/06/2015 Document Reviewed: 05/08/2015 °Elsevier Interactive Patient Education © 2017 [employer] ° ° ° ° °General Anesthesia, Adult, Care After °These instructions provide you with information about caring for yourself after your procedure. Your health care provider may also give you more specific instructions. Your treatment has been planned according to current medical practices, but problems sometimes occur. Call your health care provider if you have any problems or questions after your procedure. °What can I expect after the procedure? °After the procedure, it is common to have: °· Vomiting. °· A sore throat. °· Mental slowness. °It is common to feel: °· Nauseous. °· Cold or shivery. °· Sleepy. °· Tired. °· Sore or achy, even in parts of your body where you did not have surgery. °Follow these instructions at   home: °For at least 24 hours after the procedure:  °· Do not: °¨ Participate in activities where you could fall or become injured. °¨ Drive. °¨ Use heavy machinery. °¨ Drink alcohol. °¨ Take sleeping pills or medicines that cause drowsiness. °¨ Make important decisions or sign legal documents. °¨ Take care of children on your own. °· Rest. °Eating and drinking  °· If you vomit, drink  water, juice, or soup when you can drink without vomiting. °· Drink enough fluid to keep your urine clear or pale yellow. °· Make sure you have little or no nausea before eating solid foods. °· Follow the diet recommended by your health care provider. °General instructions  °· Have a responsible adult stay with you until you are awake and alert. °· Return to your normal activities as told by your health care provider. Ask your health care provider what activities are safe for you. °· Take over-the-counter and prescription medicines only as told by your health care provider. °· If you smoke, do not smoke without supervision. °· Keep all follow-up visits as told by your health care provider. This is important. °Contact a health care provider if: °· You continue to have nausea or vomiting at home, and medicines are not helpful. °· You cannot drink fluids or start eating again. °· You cannot urinate after 8-12 hours. °· You develop a skin rash. °· You have fever. °· You have increasing redness at the site of your procedure. °Get help right away if: °· You have difficulty breathing. °· You have chest pain. °· You have unexpected bleeding. °· You feel that you are having a life-threatening or urgent problem. °This information is not intended to replace advice given to you by your health care provider. Make sure you discuss any questions you have with your health care provider. °Document Released: 10/04/2000 Document Revised: 12/01/2015 Document Reviewed: 06/12/2015 °Elsevier Interactive Patient Education © 2017 [employer] ° °

## 2016-08-13 ENCOUNTER — Telehealth: Payer: Self-pay | Admitting: Internal Medicine

## 2016-08-13 NOTE — Telephone Encounter (Signed)
Pt declined to get the AWV. Thank you! °

## 2016-08-16 ENCOUNTER — Encounter
Admission: RE | Disposition: A | Discharge status: Home / Self Care | Payer: Self-pay | Source: Ambulatory Visit | Attending: Ophthalmology

## 2016-08-16 ENCOUNTER — Ambulatory Visit: Payer: Medicare Other | Admitting: Anesthesiology

## 2016-08-16 ENCOUNTER — Ambulatory Visit
Admission: RE | Admit: 2016-08-16 | Discharge: 2016-08-16 | Disposition: A | Discharge status: Home / Self Care | Payer: Medicare Other | Source: Ambulatory Visit | Attending: Ophthalmology | Admitting: Ophthalmology

## 2016-08-16 DIAGNOSIS — H2511 Age-related nuclear cataract, right eye: Secondary | ICD-10-CM | POA: Diagnosis not present

## 2016-08-16 DIAGNOSIS — G4730 Sleep apnea, unspecified: Secondary | ICD-10-CM | POA: Diagnosis not present

## 2016-08-16 DIAGNOSIS — Z87891 Personal history of nicotine dependence: Secondary | ICD-10-CM | POA: Insufficient documentation

## 2016-08-16 DIAGNOSIS — I10 Essential (primary) hypertension: Secondary | ICD-10-CM | POA: Diagnosis not present

## 2016-08-16 DIAGNOSIS — H2513 Age-related nuclear cataract, bilateral: Secondary | ICD-10-CM | POA: Diagnosis not present

## 2016-08-16 HISTORY — DX: Heart failure, unspecified: I50.9

## 2016-08-16 HISTORY — DX: Urinary tract infection, site not specified: N39.0

## 2016-08-16 HISTORY — DX: Unspecified osteoarthritis, unspecified site: M19.90

## 2016-08-16 HISTORY — DX: Essential (primary) hypertension: I10

## 2016-08-16 HISTORY — PX: CATARACT EXTRACTION W/PHACO: SHX586

## 2016-08-16 HISTORY — DX: Cardiac murmur, unspecified: R01.1

## 2016-08-16 SURGERY — PHACOEMULSIFICATION, CATARACT, WITH IOL INSERTION
Anesthesia: Monitor Anesthesia Care | Laterality: Right | Wound class: Clean

## 2016-08-16 MED ORDER — CEFUROXIME OPHTHALMIC INJECTION 1 MG/0.1 ML
INJECTION | OPHTHALMIC | Status: DC | PRN
  Administered 2016-08-16: 0.1 mL via INTRACAMERAL

## 2016-08-16 MED ORDER — MOXIFLOXACIN HCL 0.5 % OP SOLN
1.0000 [drp] | OPHTHALMIC | Status: DC | PRN
  Administered 2016-08-16 (×3): 1 [drp] via OPHTHALMIC

## 2016-08-16 MED ORDER — BSS IO SOLN
INTRAOCULAR | Status: DC | PRN
  Administered 2016-08-16: 70 mL via OPHTHALMIC

## 2016-08-16 MED ORDER — NA HYALUR & NA CHOND-NA HYALUR 0.4-0.35 ML IO KIT
PACK | INTRAOCULAR | Status: DC | PRN
  Administered 2016-08-16: 1 mL via INTRAOCULAR

## 2016-08-16 MED ORDER — MIDAZOLAM HCL 2 MG/2ML IJ SOLN
INTRAMUSCULAR | Status: DC | PRN
  Administered 2016-08-16: 2 mg via INTRAVENOUS

## 2016-08-16 MED ORDER — ARMC OPHTHALMIC DILATING DROPS
1.0000 "application " | OPHTHALMIC | Status: DC | PRN
  Administered 2016-08-16 (×3): 1 via OPHTHALMIC

## 2016-08-16 MED ORDER — FENTANYL CITRATE (PF) 100 MCG/2ML IJ SOLN
INTRAMUSCULAR | Status: DC | PRN
  Administered 2016-08-16 (×2): 50 ug via INTRAVENOUS

## 2016-08-16 MED ORDER — LIDOCAINE HCL (PF) 2 % IJ SOLN
INTRAMUSCULAR | Status: DC | PRN
  Administered 2016-08-16: .1 mL via INTRADERMAL

## 2016-08-16 MED ORDER — LIDOCAINE HCL (PF) 2 % IJ SOLN: INTRAOCULAR | Status: DC | PRN

## 2016-08-16 MED ORDER — BRIMONIDINE TARTRATE-TIMOLOL 0.2-0.5 % OP SOLN
OPHTHALMIC | Status: DC | PRN
  Administered 2016-08-16: 1 [drp] via OPHTHALMIC

## 2016-08-16 SURGICAL SUPPLY — 23 items
APPLICATOR COTTON TIP 3IN (MISCELLANEOUS) ×2 IMPLANT
CANNULA ANT/CHMB 27GA (MISCELLANEOUS) ×2 IMPLANT
DISSECTOR HYDRO NUCLEUS 50X22 (MISCELLANEOUS) ×2 IMPLANT
GLOVE BIO SURGEON STRL SZ7 (GLOVE) ×2 IMPLANT
GLOVE SURG LX 6.5 MICRO (GLOVE) ×1
GLOVE SURG LX STRL 6.5 MICRO (GLOVE) ×1 IMPLANT
GOWN STRL REUS W/ TWL LRG LVL3 (GOWN DISPOSABLE) ×2 IMPLANT
GOWN STRL REUS W/TWL LRG LVL3 (GOWN DISPOSABLE) ×2
LENS IOL ACRSF IQ ULTRA 15.0 (Intraocular Lens) ×1 IMPLANT
LENS IOL ACRYSOF IQ 15.0 (Intraocular Lens) ×2 IMPLANT
MARKER SKIN DUAL TIP RULER LAB (MISCELLANEOUS) ×2 IMPLANT
PACK CATARACT BRASINGTON (MISCELLANEOUS) ×2 IMPLANT
PACK EYE AFTER SURG (MISCELLANEOUS) ×2 IMPLANT
PACK OPTHALMIC (MISCELLANEOUS) ×2 IMPLANT
RING MALYGIN 7.0 (MISCELLANEOUS) IMPLANT
SUT ETHILON 10-0 CS-B-6CS-B-6 (SUTURE)
SUT VICRYL  9 0 (SUTURE)
SUT VICRYL 9 0 (SUTURE) IMPLANT
SUTURE EHLN 10-0 CS-B-6CS-B-6 (SUTURE) IMPLANT
SYR TB 1ML LUER SLIP (SYRINGE) ×2 IMPLANT
WATER STERILE IRR 250ML POUR (IV SOLUTION) ×2 IMPLANT
WICK EYE OCUCEL (MISCELLANEOUS) IMPLANT
WIPE NON LINTING 3.25X3.25 (MISCELLANEOUS) ×2 IMPLANT

## 2016-08-16 NOTE — Anesthesia Postprocedure Evaluation (Signed)
Anesthesia Post Note  Patient: Diane Guerra  Procedure(s) Performed: Procedure(s) (LRB): CATARACT EXTRACTION PHACO AND INTRAOCULAR LENS PLACEMENT (IOC)  Right eye (Right)  Patient location during evaluation: PACU Anesthesia Type: MAC Level of consciousness: awake and alert Pain management: pain level controlled Vital Signs Assessment: post-procedure vital signs reviewed and stable Respiratory status: spontaneous breathing, nonlabored ventilation, respiratory function stable and patient connected to nasal cannula oxygen Cardiovascular status: stable and blood pressure returned to baseline Anesthetic complications: no    Alisa Graff

## 2016-08-16 NOTE — Transfer of Care (Signed)
Immediate Anesthesia Transfer of Care Note  Patient: Diane Guerra  Procedure(s) Performed: Procedure(s) with comments: CATARACT EXTRACTION PHACO AND INTRAOCULAR LENS PLACEMENT (IOC)  Right eye (Right) - sleep apnea  Patient Location: PACU  Anesthesia Type: MAC  Level of Consciousness: awake, alert  and patient cooperative  Airway and Oxygen Therapy: Patient Spontanous Breathing and Patient connected to supplemental oxygen  Post-op Assessment: Post-op Vital signs reviewed, Patient's Cardiovascular Status Stable, Respiratory Function Stable, Patent Airway and No signs of Nausea or vomiting  Post-op Vital Signs: Reviewed and stable  Complications: No apparent anesthesia complications

## 2016-08-16 NOTE — Anesthesia Procedure Notes (Signed)
Procedure Name: MAC Performed by: Mayme Genta Pre-anesthesia Checklist: Patient identified, Emergency Drugs available, Suction available, Timeout performed and Patient being monitored Patient Re-evaluated:Patient Re-evaluated prior to inductionOxygen Delivery Method: Nasal cannula Placement Confirmation: positive ETCO2

## 2016-08-16 NOTE — H&P (Signed)
H+P reviewed and is up to date, please see paper chart.  

## 2016-08-16 NOTE — Anesthesia Preprocedure Evaluation (Signed)
Anesthesia Evaluation  Patient identified by MRN, date of birth, ID band Patient awake    Reviewed: Allergy & Precautions, H&P , NPO status , Patient's Chart, lab work & pertinent test results, reviewed documented beta blocker date and time   Airway Mallampati: II  TM Distance: >3 FB Neck ROM: full    Dental no notable dental hx.    Pulmonary sleep apnea and Continuous Positive Airway Pressure Ventilation , former smoker,    Pulmonary exam normal breath sounds clear to auscultation       Cardiovascular Exercise Tolerance: Good hypertension, negative cardio ROS   Rhythm:regular Rate:Normal     Neuro/Psych negative neurological ROS  negative psych ROS   GI/Hepatic negative GI ROS, Neg liver ROS,   Endo/Other  negative endocrine ROS  Renal/GU CRFRenal disease  negative genitourinary   Musculoskeletal   Abdominal   Peds  Hematology  (+) anemia ,   Anesthesia Other Findings   Reproductive/Obstetrics negative OB ROS                             Anesthesia Physical Anesthesia Plan  ASA: II  Anesthesia Plan: MAC   Post-op Pain Management:    Induction:   Airway Management Planned:   Additional Equipment:   Intra-op Plan:   Post-operative Plan:   Informed Consent: I have reviewed the patients History and Physical, chart, labs and discussed the procedure including the risks, benefits and alternatives for the proposed anesthesia with the patient or authorized representative who has indicated his/her understanding and acceptance.   Dental Advisory Given  Plan Discussed with: CRNA  Anesthesia Plan Comments:         Anesthesia Quick Evaluation

## 2016-08-16 NOTE — Op Note (Signed)
Date of Surgery: 08/16/2016  PREOPERATIVE DIAGNOSES: Visually significant nuclear sclerotic cataract, right eye.  POSTOPERATIVE DIAGNOSES: Same  PROCEDURES PERFORMED: Cataract extraction with intraocular lens implant, right eye.  SURGEON: Almon Hercules, M.D.  ANESTHESIA: MAC and topical  IMPLANTS: [id number] +15.0 D  Implant Name Type Inv. Item Serial No. Manufacturer Lot No. LRB No. Used  LENS IOL ACRYSOF IQ 15.0 - [id number] Intraocular Lens LENS IOL ACRYSOF IQ 15.0 [id number] ALCON   Right 1     COMPLICATIONS: None.  DESCRIPTION OF PROCEDURE: Therapeutic options were discussed with the patient preoperatively, including a discussion of risks and benefits of surgery. Informed consent was obtained. An IOL-Master and immersion biometry were used to take the lens measurements, and a dilated fundus exam was performed within 6 months of the surgical date.  The patient was premedicated and brought to the operating room and placed on the operating table in the supine position. After adequate anesthesia, the patient was prepped and draped in the usual sterile ophthalmic fashion. A wire lid speculum was inserted and the microscope was positioned. A Superblade was used to create a paracentesis site at the limbus and a small amount of dilute preservative free lidocaine was instilled into the anterior chamber, followed by dispersive viscoelastic. A clear corneal incision was created temporally using a 2.4 mm keratome blade. Capsulorrhexis was then performed. In situ phacoemulsification was performed.  Cortical material was removed with the irrigation-aspiration unit. Dispersive viscoelastic was instilled to open the capsular bag. A posterior chamber intraocular lens with the specifications above was inserted and positioned. Irrigation-aspiration was used to remove all viscoelastic. Cefuroxime 1cc was instilled into the anterior chamber, and the corneal incision was checked and found to be water tight. The  eyelid speculum was removed.  The operative eye was covered with protective goggles after instilling 1 drop of Combigan. The patient tolerated the procedure well. There were no complications.

## 2016-08-17 ENCOUNTER — Encounter: Payer: Self-pay | Admitting: Ophthalmology

## 2016-08-25 ENCOUNTER — Encounter: Payer: Self-pay | Admitting: *Deleted

## 2016-08-25 DIAGNOSIS — Z87440 Personal history of urinary (tract) infections: Secondary | ICD-10-CM | POA: Diagnosis not present

## 2016-08-25 NOTE — Discharge Instructions (Signed)
General Anesthesia, Adult, Care After These instructions provide you with information about caring for yourself after your procedure. Your health care provider may also give you more specific instructions. Your treatment has been planned according to current medical practices, but problems sometimes occur. Call your health care provider if you have any problems or questions after your procedure. What can I expect after the procedure? After the procedure, it is common to have:  Vomiting.  A sore throat.  Mental slowness. It is common to feel:  Nauseous.  Cold or shivery.  Sleepy.  Tired.  Sore or achy, even in parts of your body where you did not have surgery. Follow these instructions at home: For at least 24 hours after the procedure:  Do not:  Participate in activities where you could fall or become injured.  Drive.  Use heavy machinery.  Drink alcohol.  Take sleeping pills or medicines that cause drowsiness.  Make important decisions or sign legal documents.  Take care of children on your own.  Rest. Eating and drinking  If you vomit, drink water, juice, or soup when you can drink without vomiting.  Drink enough fluid to keep your urine clear or pale yellow.  Make sure you have little or no nausea before eating solid foods.  Follow the diet recommended by your health care provider. General instructions  Have a responsible adult stay with you until you are awake and alert.  Return to your normal activities as told by your health care provider. Ask your health care provider what activities are safe for you.  Take over-the-counter and prescription medicines only as told by your health care provider.  If you smoke, do not smoke without supervision.  Keep all follow-up visits as told by your health care provider. This is important. Contact a health care provider if:  You continue to have nausea or vomiting at home, and medicines are not helpful.  You  cannot drink fluids or start eating again.  You cannot urinate after 8-12 hours.  You develop a skin rash.  You have fever.  You have increasing redness at the site of your procedure. Get help right away if:  You have difficulty breathing.  You have chest pain.  You have unexpected bleeding.  You feel that you are having a life-threatening or urgent problem. This information is not intended to replace advice given to you by your health care provider. Make sure you discuss any questions you have with your health care provider. Document Released: 10/04/2000 Document Revised: 12/01/2015 Document Reviewed: 06/12/2015 Elsevier Interactive Patient Education  2017 [facility], Care After Refer to this sheet in the next few weeks. These instructions provide you with information about caring for yourself after your procedure. Your health care provider may also give you more specific instructions. Your treatment has been planned according to current medical practices, but problems sometimes occur. Call your health care provider if you have any problems or questions after your procedure. What can I expect after the procedure? After the procedure, it is common to have:  Itching.  Discomfort.  Fluid discharge.  Sensitivity to light and to touch.  Bruising. Follow these instructions at home: [place] your eye every day for signs of infection. Watch for:  Redness, swelling, or pain.  Fluid, blood, or pus.  Warmth.  Bad smell. Activity  Avoid strenuous activities, such as playing contact sports, for as long as told by your health care provider.  Do not  drive or operate heavy machinery until your health care provider approves.  Do not bend or lift heavy objects. Bending increases pressure in the eye. You can walk, climb stairs, and do light household chores.  Ask your health care provider when you can return to work. If you work in a dusty  environment, you may be advised to wear protective eyewear for a period of time. General instructions  Take or apply over-the-counter and prescription medicines only as told by your health care provider. This includes eye drops.  Do not touch or rub your eyes.  If you were given a protective shield, wear it as told by your health care provider. If you were not given a protective shield, wear sunglasses as told by your health care provider to protect your eyes.  Keep the area around your eye clean and dry. Avoid swimming or allowing water to hit you directly in the face while showering until told by your health care provider. Keep soap and shampoo out of your eyes.  Do not put a contact lens into the affected eye or eyes until your health care provider approves.  Keep all follow-up visits as told by your health care provider. This is important. Contact a health care provider if:   You have increased bruising around your eye.  You have pain that is not helped with medicine.  You have a fever.  You have redness, swelling, or pain in your eye.  You have fluid, blood, or pus coming from your incision.  Your vision gets worse. Get help right away if:  You have sudden vision loss. This information is not intended to replace advice given to you by your health care provider. Make sure you discuss any questions you have with your health care provider. Document Released: 01/15/2005 Document Revised: 11/06/2015 Document Reviewed: 05/08/2015 Elsevier Interactive Patient Education  2017 [employer].

## 2016-08-26 DIAGNOSIS — H2512 Age-related nuclear cataract, left eye: Secondary | ICD-10-CM | POA: Diagnosis not present

## 2016-08-30 ENCOUNTER — Ambulatory Visit
Admission: RE | Admit: 2016-08-30 | Discharge: 2016-08-30 | Disposition: A | Discharge status: Home / Self Care | Payer: Medicare Other | Source: Ambulatory Visit | Attending: Ophthalmology | Admitting: Ophthalmology

## 2016-08-30 ENCOUNTER — Ambulatory Visit: Payer: Medicare Other | Admitting: Student in an Organized Health Care Education/Training Program

## 2016-08-30 ENCOUNTER — Encounter
Admission: RE | Disposition: A | Discharge status: Home / Self Care | Payer: Self-pay | Source: Ambulatory Visit | Attending: Ophthalmology

## 2016-08-30 DIAGNOSIS — G4730 Sleep apnea, unspecified: Secondary | ICD-10-CM | POA: Insufficient documentation

## 2016-08-30 DIAGNOSIS — Z87891 Personal history of nicotine dependence: Secondary | ICD-10-CM | POA: Diagnosis not present

## 2016-08-30 DIAGNOSIS — I110 Hypertensive heart disease with heart failure: Secondary | ICD-10-CM | POA: Diagnosis not present

## 2016-08-30 DIAGNOSIS — N289 Disorder of kidney and ureter, unspecified: Secondary | ICD-10-CM | POA: Diagnosis not present

## 2016-08-30 DIAGNOSIS — D649 Anemia, unspecified: Secondary | ICD-10-CM | POA: Insufficient documentation

## 2016-08-30 DIAGNOSIS — H2512 Age-related nuclear cataract, left eye: Secondary | ICD-10-CM | POA: Insufficient documentation

## 2016-08-30 DIAGNOSIS — I509 Heart failure, unspecified: Secondary | ICD-10-CM | POA: Diagnosis not present

## 2016-08-30 HISTORY — PX: CATARACT EXTRACTION W/PHACO: SHX586

## 2016-08-30 SURGERY — PHACOEMULSIFICATION, CATARACT, WITH IOL INSERTION
Anesthesia: Monitor Anesthesia Care | Site: Eye | Laterality: Left | Wound class: Clean

## 2016-08-30 MED ORDER — ARMC OPHTHALMIC DILATING DROPS
1.0000 "application " | OPHTHALMIC | Status: DC | PRN
  Administered 2016-08-30 (×3): 1 via OPHTHALMIC

## 2016-08-30 MED ORDER — LACTATED RINGERS IV SOLN: INTRAVENOUS | Status: DC

## 2016-08-30 MED ORDER — NA HYALUR & NA CHOND-NA HYALUR 0.4-0.35 ML IO KIT
PACK | INTRAOCULAR | Status: DC | PRN
  Administered 2016-08-30: 1 mL via INTRAOCULAR

## 2016-08-30 MED ORDER — FENTANYL CITRATE (PF) 100 MCG/2ML IJ SOLN
INTRAMUSCULAR | Status: DC | PRN
Start: 2016-08-30 — End: 2016-08-30
  Administered 2016-08-30 (×3): 50 ug via INTRAVENOUS

## 2016-08-30 MED ORDER — BSS IO SOLN
INTRAOCULAR | Status: DC | PRN
  Administered 2016-08-30: 144 mL via OPHTHALMIC

## 2016-08-30 MED ORDER — CEFUROXIME OPHTHALMIC INJECTION 1 MG/0.1 ML
INJECTION | OPHTHALMIC | Status: DC | PRN
  Administered 2016-08-30: .3 mL via OPHTHALMIC

## 2016-08-30 MED ORDER — BRIMONIDINE TARTRATE-TIMOLOL 0.2-0.5 % OP SOLN
OPHTHALMIC | Status: DC | PRN
  Administered 2016-08-30: 1 [drp] via OPHTHALMIC

## 2016-08-30 MED ORDER — LIDOCAINE HCL (PF) 2 % IJ SOLN
INTRAMUSCULAR | Status: DC | PRN
  Administered 2016-08-30: .2 mL

## 2016-08-30 MED ORDER — MOXIFLOXACIN HCL 0.5 % OP SOLN
1.0000 [drp] | OPHTHALMIC | Status: DC | PRN
  Administered 2016-08-30 (×3): 1 [drp] via OPHTHALMIC

## 2016-08-30 MED ORDER — MIDAZOLAM HCL 2 MG/2ML IJ SOLN
INTRAMUSCULAR | Status: DC | PRN
  Administered 2016-08-30: 2 mg via INTRAVENOUS

## 2016-08-30 SURGICAL SUPPLY — 23 items
APPLICATOR COTTON TIP 3IN (MISCELLANEOUS) ×2 IMPLANT
CANNULA ANT/CHMB 27GA (MISCELLANEOUS) ×2 IMPLANT
DISSECTOR HYDRO NUCLEUS 50X22 (MISCELLANEOUS) ×2 IMPLANT
GLOVE BIO SURGEON STRL SZ7 (GLOVE) ×2 IMPLANT
GLOVE SURG LX 6.5 MICRO (GLOVE) ×1
GLOVE SURG LX STRL 6.5 MICRO (GLOVE) ×1 IMPLANT
GOWN STRL REUS W/ TWL LRG LVL3 (GOWN DISPOSABLE) ×2 IMPLANT
GOWN STRL REUS W/TWL LRG LVL3 (GOWN DISPOSABLE) ×2
LENS IOL ACRSF IQ ULTRA 14.5 (Intraocular Lens) ×1 IMPLANT
LENS IOL ACRYSOF IQ 14.5 (Intraocular Lens) ×2 IMPLANT
MARKER SKIN DUAL TIP RULER LAB (MISCELLANEOUS) ×2 IMPLANT
PACK CATARACT BRASINGTON (MISCELLANEOUS) ×2 IMPLANT
PACK EYE AFTER SURG (MISCELLANEOUS) ×2 IMPLANT
PACK OPTHALMIC (MISCELLANEOUS) ×2 IMPLANT
RING MALYGIN 7.0 (MISCELLANEOUS) IMPLANT
SUT ETHILON 10-0 CS-B-6CS-B-6 (SUTURE)
SUT VICRYL  9 0 (SUTURE)
SUT VICRYL 9 0 (SUTURE) IMPLANT
SUTURE EHLN 10-0 CS-B-6CS-B-6 (SUTURE) IMPLANT
SYR TB 1ML LUER SLIP (SYRINGE) ×2 IMPLANT
WATER STERILE IRR 250ML POUR (IV SOLUTION) ×2 IMPLANT
WICK EYE OCUCEL (MISCELLANEOUS) IMPLANT
WIPE NON LINTING 3.25X3.25 (MISCELLANEOUS) ×2 IMPLANT

## 2016-08-30 NOTE — Anesthesia Procedure Notes (Signed)
Procedure Name: MAC Performed by: Mayme Genta Pre-anesthesia Checklist: Patient identified, Emergency Drugs available, Suction available, Timeout performed and Patient being monitored Patient Re-evaluated:Patient Re-evaluated prior to inductionOxygen Delivery Method: Nasal cannula Placement Confirmation: positive ETCO2

## 2016-08-30 NOTE — Op Note (Signed)
Date of Surgery: 08/30/2016  PREOPERATIVE DIAGNOSES: Visually significant nuclear sclerotic cataract, left eye.  POSTOPERATIVE DIAGNOSES: Same  PROCEDURES PERFORMED: Cataract extraction with intraocular lens implant, left eye.  SURGEON: Almon Hercules, M.D.  ANESTHESIA: MAC and topical  IMPLANTS: [id number] +14.5 D  Implant Name Type Inv. Item Serial No. Manufacturer Lot No. LRB No. Used  LENS IOL ACRYSOF IQ 14.5 - [id number] Intraocular Lens LENS IOL ACRYSOF IQ 14.5 [id number] [employer]CON   Left 1    COMPLICATIONS: None.  DESCRIPTION OF PROCEDURE: Therapeutic options were discussed with the patient preoperatively, including a discussion of risks and benefits of surgery. Informed consent was obtained. An IOL-Master and immersion biometry were used to take the lens measurements, and a dilated fundus exam was performed within 6 months of the surgical date.  The patient was premedicated and brought to the operating room and placed on the operating table in the supine position. After adequate anesthesia, the patient was prepped and draped in the usual sterile ophthalmic fashion. A wire lid speculum was inserted and the microscope was positioned. A Superblade was used to create a paracentesis site at the limbus and a small amount of dilute preservative free lidocaine was instilled into the anterior chamber, followed by dispersive viscoelastic. A clear corneal incision was created temporally using a 2.4 mm keratome blade. Capsulorrhexis was then performed. In situ phacoemulsification was performed.  Cortical material was removed with the irrigation-aspiration unit. Dispersive viscoelastic was instilled to open the capsular bag. A posterior chamber intraocular lens with the specifications above was inserted and positioned. Irrigation-aspiration was used to remove all viscoelastic. Cefuroxime 1cc was instilled into the anterior chamber, and the corneal incision was checked and found to be water tight. The  eyelid speculum was removed.  The operative eye was covered with protective goggles after instilling 1 drop of timolol and brimonidine. The patient tolerated the procedure well. There were no complications.

## 2016-08-30 NOTE — Anesthesia Postprocedure Evaluation (Signed)
Anesthesia Post Note  Patient: Diane Guerra  Procedure(s) Performed: Procedure(s) (LRB): CATARACT EXTRACTION PHACO AND INTRAOCULAR LENS PLACEMENT (IOC)  Left (Left)  Patient location during evaluation: PACU Anesthesia Type: MAC Level of consciousness: awake and alert and oriented Pain management: satisfactory to patient Vital Signs Assessment: post-procedure vital signs reviewed and stable Respiratory status: spontaneous breathing, nonlabored ventilation and respiratory function stable Cardiovascular status: blood pressure returned to baseline and stable Postop Assessment: Adequate PO intake and No signs of nausea or vomiting Anesthetic complications: no    Raliegh Ip

## 2016-08-30 NOTE — Anesthesia Preprocedure Evaluation (Signed)
Anesthesia Evaluation  Patient identified by MRN, date of birth, ID band Patient awake    Reviewed: Allergy & Precautions, H&P , NPO status , Patient's Chart, lab work & pertinent test results, reviewed documented beta blocker date and time   Airway Mallampati: IV  TM Distance: >3 FB Neck ROM: full    Dental no notable dental hx.    Pulmonary sleep apnea and Continuous Positive Airway Pressure Ventilation , former smoker,    Pulmonary exam normal breath sounds clear to auscultation       Cardiovascular Exercise Tolerance: Good hypertension, +CHF  negative cardio ROS  + Valvular Problems/Murmurs  Rhythm:regular Rate:Normal + Systolic murmurs    Neuro/Psych negative neurological ROS  negative psych ROS   GI/Hepatic negative GI ROS, Neg liver ROS,   Endo/Other  negative endocrine ROS  Renal/GU Renal disease  negative genitourinary   Musculoskeletal   Abdominal   Peds  Hematology  (+) anemia ,   Anesthesia Other Findings   Reproductive/Obstetrics negative OB ROS                             Anesthesia Physical  Anesthesia Plan  ASA: III  Anesthesia Plan: MAC   Post-op Pain Management:    Induction:   Airway Management Planned:   Additional Equipment:   Intra-op Plan:   Post-operative Plan:   Informed Consent: I have reviewed the patients History and Physical, chart, labs and discussed the procedure including the risks, benefits and alternatives for the proposed anesthesia with the patient or authorized representative who has indicated his/her understanding and acceptance.   Dental Advisory Given  Plan Discussed with: CRNA  Anesthesia Plan Comments:         Anesthesia Quick Evaluation

## 2016-08-30 NOTE — Transfer of Care (Signed)
Immediate Anesthesia Transfer of Care Note  Patient: Diane Guerra  Procedure(s) Performed: Procedure(s) with comments: CATARACT EXTRACTION PHACO AND INTRAOCULAR LENS PLACEMENT (IOC)  Left (Left) - sleep apnea  Patient Location: PACU  Anesthesia Type: MAC  Level of Consciousness: awake, alert  and patient cooperative  Airway and Oxygen Therapy: Patient Spontanous Breathing and Patient connected to supplemental oxygen  Post-op Assessment: Post-op Vital signs reviewed, Patient's Cardiovascular Status Stable, Respiratory Function Stable, Patent Airway and No signs of Nausea or vomiting  Post-op Vital Signs: Reviewed and stable  Complications: No apparent anesthesia complications

## 2016-08-30 NOTE — H&P (Signed)
H+P reviewed and is up to date, please see paper chart.  

## 2016-08-31 ENCOUNTER — Encounter: Payer: Self-pay | Admitting: Ophthalmology

## 2016-09-20 ENCOUNTER — Encounter: Payer: Medicare Other | Admitting: Internal Medicine

## 2016-09-21 DIAGNOSIS — I5032 Chronic diastolic (congestive) heart failure: Secondary | ICD-10-CM | POA: Diagnosis not present

## 2016-09-21 DIAGNOSIS — M109 Gout, unspecified: Secondary | ICD-10-CM | POA: Diagnosis not present

## 2016-09-21 DIAGNOSIS — J449 Chronic obstructive pulmonary disease, unspecified: Secondary | ICD-10-CM | POA: Diagnosis not present

## 2016-09-21 DIAGNOSIS — D473 Essential (hemorrhagic) thrombocythemia: Secondary | ICD-10-CM | POA: Diagnosis not present

## 2016-09-21 DIAGNOSIS — Z87440 Personal history of urinary (tract) infections: Secondary | ICD-10-CM | POA: Diagnosis not present

## 2016-09-21 DIAGNOSIS — R011 Cardiac murmur, unspecified: Secondary | ICD-10-CM | POA: Diagnosis not present

## 2016-09-21 DIAGNOSIS — I110 Hypertensive heart disease with heart failure: Secondary | ICD-10-CM | POA: Diagnosis not present

## 2016-09-21 DIAGNOSIS — Z79899 Other long term (current) drug therapy: Secondary | ICD-10-CM | POA: Diagnosis not present

## 2016-09-21 DIAGNOSIS — Z7952 Long term (current) use of systemic steroids: Secondary | ICD-10-CM | POA: Diagnosis not present

## 2016-09-21 DIAGNOSIS — K8020 Calculus of gallbladder without cholecystitis without obstruction: Secondary | ICD-10-CM | POA: Diagnosis not present

## 2016-09-21 DIAGNOSIS — Z7951 Long term (current) use of inhaled steroids: Secondary | ICD-10-CM | POA: Diagnosis not present

## 2016-09-21 DIAGNOSIS — Z940 Kidney transplant status: Secondary | ICD-10-CM | POA: Diagnosis not present

## 2016-09-21 DIAGNOSIS — D899 Disorder involving the immune mechanism, unspecified: Secondary | ICD-10-CM | POA: Diagnosis not present

## 2016-09-21 DIAGNOSIS — I272 Pulmonary hypertension, unspecified: Secondary | ICD-10-CM | POA: Diagnosis not present

## 2016-09-21 DIAGNOSIS — M170 Bilateral primary osteoarthritis of knee: Secondary | ICD-10-CM | POA: Diagnosis not present

## 2016-09-21 DIAGNOSIS — E611 Iron deficiency: Secondary | ICD-10-CM | POA: Diagnosis not present

## 2016-09-21 DIAGNOSIS — Z09 Encounter for follow-up examination after completed treatment for conditions other than malignant neoplasm: Secondary | ICD-10-CM | POA: Diagnosis not present

## 2016-09-21 DIAGNOSIS — Z6838 Body mass index (BMI) 38.0-38.9, adult: Secondary | ICD-10-CM | POA: Diagnosis not present

## 2016-09-21 DIAGNOSIS — Z48298 Encounter for aftercare following other organ transplant: Secondary | ICD-10-CM | POA: Diagnosis not present

## 2016-09-21 DIAGNOSIS — R600 Localized edema: Secondary | ICD-10-CM | POA: Diagnosis not present

## 2016-09-21 DIAGNOSIS — Z79818 Long term (current) use of other agents affecting estrogen receptors and estrogen levels: Secondary | ICD-10-CM | POA: Diagnosis not present

## 2016-09-21 DIAGNOSIS — Z7982 Long term (current) use of aspirin: Secondary | ICD-10-CM | POA: Diagnosis not present

## 2016-09-29 DIAGNOSIS — Z86018 Personal history of other benign neoplasm: Secondary | ICD-10-CM | POA: Diagnosis not present

## 2016-09-29 DIAGNOSIS — D485 Neoplasm of uncertain behavior of skin: Secondary | ICD-10-CM | POA: Diagnosis not present

## 2016-09-29 DIAGNOSIS — L821 Other seborrheic keratosis: Secondary | ICD-10-CM | POA: Diagnosis not present

## 2016-10-12 DIAGNOSIS — D473 Essential (hemorrhagic) thrombocythemia: Secondary | ICD-10-CM | POA: Diagnosis not present

## 2016-10-12 DIAGNOSIS — Z6838 Body mass index (BMI) 38.0-38.9, adult: Secondary | ICD-10-CM | POA: Diagnosis not present

## 2016-11-08 DIAGNOSIS — L821 Other seborrheic keratosis: Secondary | ICD-10-CM | POA: Diagnosis not present

## 2016-11-08 DIAGNOSIS — Z86018 Personal history of other benign neoplasm: Secondary | ICD-10-CM | POA: Diagnosis not present

## 2016-11-08 DIAGNOSIS — D485 Neoplasm of uncertain behavior of skin: Secondary | ICD-10-CM | POA: Diagnosis not present

## 2016-11-08 DIAGNOSIS — D229 Melanocytic nevi, unspecified: Secondary | ICD-10-CM | POA: Diagnosis not present

## 2016-11-17 ENCOUNTER — Telehealth: Payer: Self-pay | Admitting: Internal Medicine

## 2016-11-17 NOTE — Telephone Encounter (Signed)
Left pt message asking to call Allison back directly at [phone] to schedule AWV. Thanks! °

## 2016-12-03 ENCOUNTER — Encounter: Payer: Medicare Other | Admitting: Internal Medicine

## 2016-12-16 NOTE — Telephone Encounter (Signed)
Pt declined AWV. °

## 2017-01-19 ENCOUNTER — Ambulatory Visit
Admission: RE | Admit: 2017-01-19 | Discharge: 2017-01-19 | Disposition: A | Discharge status: Home / Self Care | Payer: MEDICARE

## 2017-01-19 DIAGNOSIS — D473 Essential (hemorrhagic) thrombocythemia: Secondary | ICD-10-CM | POA: Diagnosis not present

## 2017-01-19 DIAGNOSIS — Z940 Kidney transplant status: Secondary | ICD-10-CM | POA: Diagnosis not present

## 2017-01-19 DIAGNOSIS — Z79899 Other long term (current) drug therapy: Secondary | ICD-10-CM | POA: Diagnosis not present

## 2017-02-18 ENCOUNTER — Encounter: Payer: Medicare Other | Admitting: Internal Medicine

## 2017-03-08 MED ORDER — ALLOPURINOL 100 MG TABLET
ORAL_TABLET | 11 refills | 0 days | Status: CP
Start: 2017-03-08 — End: 2017-04-15

## 2017-03-08 MED ORDER — FUROSEMIDE 40 MG TABLET
ORAL_TABLET | 3 refills | 0 days | Status: CP
Start: 2017-03-08 — End: 2017-04-15

## 2017-04-04 ENCOUNTER — Encounter: Payer: Self-pay | Admitting: Internal Medicine

## 2017-04-05 NOTE — Telephone Encounter (Signed)
See her my chart message.  I have printed her sleep study and wrote rx and placed in box.

## 2017-04-13 ENCOUNTER — Encounter: Payer: Self-pay | Admitting: Internal Medicine

## 2017-04-14 ENCOUNTER — Encounter: Payer: Self-pay | Admitting: Internal Medicine

## 2017-04-14 DIAGNOSIS — G4730 Sleep apnea, unspecified: Secondary | ICD-10-CM

## 2017-04-15 ENCOUNTER — Ambulatory Visit: Admission: RE | Admit: 2017-04-15 | Discharge: 2017-04-15 | Disposition: A | Discharge status: Home / Self Care

## 2017-04-15 ENCOUNTER — Ambulatory Visit
Admission: RE | Admit: 2017-04-15 | Discharge: 2017-04-15 | Disposition: A | Discharge status: Home / Self Care | Payer: MEDICARE

## 2017-04-15 ENCOUNTER — Encounter: Payer: Self-pay | Admitting: Internal Medicine

## 2017-04-15 DIAGNOSIS — J449 Chronic obstructive pulmonary disease, unspecified: Secondary | ICD-10-CM | POA: Diagnosis not present

## 2017-04-15 DIAGNOSIS — Z6839 Body mass index (BMI) 39.0-39.9, adult: Secondary | ICD-10-CM | POA: Diagnosis not present

## 2017-04-15 DIAGNOSIS — Z792 Long term (current) use of antibiotics: Secondary | ICD-10-CM | POA: Diagnosis not present

## 2017-04-15 DIAGNOSIS — Z48298 Encounter for aftercare following other organ transplant: Secondary | ICD-10-CM | POA: Diagnosis not present

## 2017-04-15 DIAGNOSIS — M109 Gout, unspecified: Secondary | ICD-10-CM | POA: Diagnosis not present

## 2017-04-15 DIAGNOSIS — Z7982 Long term (current) use of aspirin: Secondary | ICD-10-CM | POA: Diagnosis not present

## 2017-04-15 DIAGNOSIS — Z791 Long term (current) use of non-steroidal anti-inflammatories (NSAID): Secondary | ICD-10-CM | POA: Diagnosis not present

## 2017-04-15 DIAGNOSIS — I110 Hypertensive heart disease with heart failure: Secondary | ICD-10-CM | POA: Diagnosis not present

## 2017-04-15 DIAGNOSIS — M170 Bilateral primary osteoarthritis of knee: Secondary | ICD-10-CM | POA: Diagnosis not present

## 2017-04-15 DIAGNOSIS — Z23 Encounter for immunization: Secondary | ICD-10-CM | POA: Diagnosis not present

## 2017-04-15 DIAGNOSIS — Z4822 Encounter for aftercare following kidney transplant: Secondary | ICD-10-CM | POA: Diagnosis not present

## 2017-04-15 DIAGNOSIS — N271 Small kidney, bilateral: Secondary | ICD-10-CM | POA: Diagnosis not present

## 2017-04-15 DIAGNOSIS — G4733 Obstructive sleep apnea (adult) (pediatric): Secondary | ICD-10-CM | POA: Diagnosis not present

## 2017-04-15 DIAGNOSIS — Z87440 Personal history of urinary (tract) infections: Secondary | ICD-10-CM | POA: Diagnosis not present

## 2017-04-15 DIAGNOSIS — Z940 Kidney transplant status: Secondary | ICD-10-CM | POA: Diagnosis not present

## 2017-04-15 DIAGNOSIS — Z7952 Long term (current) use of systemic steroids: Secondary | ICD-10-CM | POA: Diagnosis not present

## 2017-04-15 DIAGNOSIS — I771 Stricture of artery: Secondary | ICD-10-CM | POA: Diagnosis not present

## 2017-04-15 DIAGNOSIS — I5030 Unspecified diastolic (congestive) heart failure: Secondary | ICD-10-CM | POA: Diagnosis not present

## 2017-04-15 DIAGNOSIS — Z79899 Other long term (current) drug therapy: Secondary | ICD-10-CM | POA: Diagnosis not present

## 2017-04-15 DIAGNOSIS — D473 Essential (hemorrhagic) thrombocythemia: Secondary | ICD-10-CM | POA: Diagnosis not present

## 2017-04-15 DIAGNOSIS — R197 Diarrhea, unspecified: Secondary | ICD-10-CM | POA: Diagnosis not present

## 2017-04-15 DIAGNOSIS — R609 Edema, unspecified: Secondary | ICD-10-CM | POA: Diagnosis not present

## 2017-04-15 DIAGNOSIS — Z7951 Long term (current) use of inhaled steroids: Secondary | ICD-10-CM | POA: Diagnosis not present

## 2017-04-15 DIAGNOSIS — Z1159 Encounter for screening for other viral diseases: Secondary | ICD-10-CM

## 2017-04-15 DIAGNOSIS — Z114 Encounter for screening for human immunodeficiency virus [HIV]: Secondary | ICD-10-CM

## 2017-04-15 MED ORDER — EPLERENONE 25 MG TABLET
ORAL_TABLET | Freq: Every day | ORAL | 1 refills | 0 days | Status: CP
Start: 2017-04-15 — End: 2017-05-18

## 2017-04-15 MED ORDER — PROGRAF 0.5 MG CAPSULE
ORAL_CAPSULE | 3 refills | 0 days | Status: CP
Start: 2017-04-15 — End: 2017-12-10

## 2017-04-15 MED ORDER — AMLODIPINE 5 MG TABLET
ORAL_TABLET | Freq: Every day | ORAL | 3 refills | 0.00000 days | Status: CP
Start: 2017-04-15 — End: 2018-05-15

## 2017-04-15 MED ORDER — CARVEDILOL 25 MG TABLET
ORAL_TABLET | Freq: Two times a day (BID) | ORAL | 3 refills | 0 days | Status: CP
Start: 2017-04-15 — End: 2018-04-11

## 2017-04-15 MED ORDER — ALLOPURINOL 100 MG TABLET
ORAL_TABLET | Freq: Two times a day (BID) | ORAL | 11 refills | 0 days | Status: CP
Start: 2017-04-15 — End: 2017-05-04

## 2017-04-15 MED ORDER — ZOLPIDEM 5 MG TABLET
ORAL_TABLET | Freq: Every evening | ORAL | 3 refills | 0.00000 days | Status: CP | PRN
Start: 2017-04-15 — End: 2018-04-11

## 2017-04-15 MED ORDER — FUROSEMIDE 40 MG TABLET
ORAL_TABLET | Freq: Every day | ORAL | 3 refills | 0.00000 days | Status: CP
Start: 2017-04-15 — End: 2018-04-11

## 2017-04-15 MED ORDER — SODIUM BICARBONATE 325 MG TABLET
ORAL_TABLET | Freq: Two times a day (BID) | ORAL | 3 refills | 0.00000 days | Status: CP
Start: 2017-04-15 — End: 2018-04-11

## 2017-04-15 MED ORDER — MYFORTIC 180 MG TABLET,DELAYED RELEASE
ORAL_TABLET | 11 refills | 0 days | Status: CP
Start: 2017-04-15 — End: 2018-04-11

## 2017-04-15 MED ORDER — LOSARTAN 50 MG TABLET
ORAL_TABLET | Freq: Two times a day (BID) | ORAL | 3 refills | 0 days | Status: CP
Start: 2017-04-15 — End: 2018-04-11

## 2017-04-15 NOTE — Telephone Encounter (Signed)
Letter typed and printed.  Will update rx with cpap instructions.

## 2017-04-15 NOTE — Telephone Encounter (Signed)
Signed and placed in box.   

## 2017-04-26 ENCOUNTER — Ambulatory Visit
Admission: RE | Admit: 2017-04-26 | Discharge: 2017-04-26 | Disposition: A | Discharge status: Home / Self Care | Payer: MEDICARE | Attending: Hematology & Oncology | Admitting: Hematology & Oncology

## 2017-04-26 DIAGNOSIS — D473 Essential (hemorrhagic) thrombocythemia: Secondary | ICD-10-CM | POA: Diagnosis not present

## 2017-04-26 DIAGNOSIS — Z6839 Body mass index (BMI) 39.0-39.9, adult: Secondary | ICD-10-CM | POA: Diagnosis not present

## 2017-04-26 MED ORDER — HYDROXYUREA 500 MG CAPSULE
ORAL_CAPSULE | Freq: Every day | ORAL | 4 refills | 0 days
Start: 2017-04-26 — End: 2017-12-17

## 2017-05-18 MED ORDER — EPLERENONE 25 MG TABLET
ORAL_TABLET | Freq: Every day | ORAL | 1 refills | 0.00000 days | Status: CP
Start: 2017-05-18 — End: 2017-08-26

## 2017-06-16 ENCOUNTER — Other Ambulatory Visit: Payer: Self-pay

## 2017-07-13 ENCOUNTER — Ambulatory Visit (INDEPENDENT_AMBULATORY_CARE_PROVIDER_SITE_OTHER): Payer: Medicare Other | Admitting: Internal Medicine

## 2017-07-13 ENCOUNTER — Encounter: Payer: Self-pay | Admitting: Internal Medicine

## 2017-07-13 VITALS — BP 130/68 | HR 64 | Temp 97.6°F | Resp 18 | Wt 218.0 lb

## 2017-07-13 DIAGNOSIS — D473 Essential (hemorrhagic) thrombocythemia: Secondary | ICD-10-CM

## 2017-07-13 DIAGNOSIS — J069 Acute upper respiratory infection, unspecified: Secondary | ICD-10-CM | POA: Diagnosis not present

## 2017-07-13 DIAGNOSIS — R52 Pain, unspecified: Secondary | ICD-10-CM | POA: Diagnosis not present

## 2017-07-13 DIAGNOSIS — N189 Chronic kidney disease, unspecified: Secondary | ICD-10-CM

## 2017-07-13 MED ORDER — PREDNISONE 10 MG PO TABS: ORAL_TABLET | ORAL | 0 refills | Status: DC

## 2017-07-13 MED ORDER — CEFDINIR 300 MG PO CAPS
300.0000 mg | ORAL_CAPSULE | Freq: Two times a day (BID) | ORAL | 0 refills | Status: DC
Start: 2017-07-13 — End: 2018-05-12

## 2017-07-13 NOTE — Progress Notes (Addendum)
Patient ID: Diane Guerra, female   DOB: 1951-02-22, 67 y.o.   MRN: [id number]   Subjective:    Patient ID: Diane Guerra, female    DOB: 1950/10/02, 67 y.o.   MRN: [id number]  HPI  Patient here for a scheduled physical, but was sick today - so elected to hold on physical exam.  Reports increased cough and congestion.  States symptoms started a few days ago.  Husband has been sick.  Reports symptoms started with sore throat.  Some nasal congestion.  No sinus pressure.  Some drainage.  Increased chest congestion and increased cough.  Having "coughing fits".  Tired from the increased cough.  No chest pain.  Tmax 98.5.  Taking mucinex.  Not using her nasal sprays.  No vomiting.  She is eating.  No diarrhea.  Due to f/u with cardiology 08/05/17.  Being followed by hematology for her blood counts.  Last visit, increased hydrea.  Discussed the need for f/u labs.     Past Medical History:  Diagnosis Date  . Anemia   . Arthritis    knees  . CHF (congestive heart failure) ([place])   . Chronic kidney disease 2007   followed by Dr Juanito Doom  . Chronic UTI   . Essential thrombocytosis ([place])   . Gout   . Heart murmur   . Hypertension   . Kidney failure 2014  . Pulmonary edema 2014  . Sleep apnea with use of continuous positive airway pressure (CPAP)    Past Surgical History:  Procedure Laterality Date  . BREAST BIOPSY Left 1986   neg  . CATARACT EXTRACTION W/PHACO Right 08/16/2016   Procedure: CATARACT EXTRACTION PHACO AND INTRAOCULAR LENS PLACEMENT ([place])  Right eye;  Surgeon: Ronnell Freshwater, MD;  Location: [facility];  Service: Ophthalmology;  Laterality: Right;  sleep apnea  . CATARACT EXTRACTION W/PHACO Left 08/30/2016   Procedure: CATARACT EXTRACTION PHACO AND INTRAOCULAR LENS PLACEMENT ([place])  Left;  Surgeon: Ronnell Freshwater, MD;  Location: [facility];  Service: Ophthalmology;  Laterality: Left;  sleep apnea  . COLONOSCOPY    . HAND SURGERY    . KIDNEY  TRANSPLANT  [id number]  . PORT-A-CATH REMOVAL    . PORTACATH PLACEMENT    . TUBAL LIGATION     Family History  Problem Relation Age of Onset  . Kidney disease Father   . Heart disease Father        myocardial infarction  . Breast cancer Neg Hx    Social History   Socioeconomic History  . Marital status: Married    Spouse name: None  . Number of children: None  . Years of education: None  . Highest education level: None  Social Needs  . Financial resource strain: None  . Food insecurity - worry: None  . Food insecurity - inability: None  . Transportation needs - medical: None  . Transportation needs - non-medical: None  Occupational History  . None  Tobacco Use  . Smoking status: Former Smoker    Last attempt to quit: 07/12/2001    Years since quitting: 16.0  . Smokeless tobacco: Never Used  Substance and Sexual Activity  . Alcohol use: Yes    Alcohol/week: 1.2 oz    Types: 2 Glasses of wine per week    Comment: has an occasional galss of wine  . Drug use: No  . Sexual activity: Yes  Other Topics Concern  . None  Social History Narrative  . None  Outpatient Encounter Medications as of 07/13/2017  Medication Sig  . acetaminophen (TYLENOL) 325 MG tablet Take 650 mg by mouth every 6 (six) hours as needed.  Marland Kitchen allopurinol (ZYLOPRIM) 100 MG tablet Take 100 mg by mouth 2 (two) times daily.   Marland Kitchen amLODipine (NORVASC) 5 MG tablet Take 5 mg by mouth daily.  Marland Kitchen aspirin 81 MG tablet Take 81 mg by mouth daily.  . carvedilol (COREG) 25 MG tablet Take 25 mg by mouth 2 (two) times daily with a meal.  . cephALEXin (KEFLEX) 250 MG capsule Take by mouth daily.  . cholecalciferol (VITAMIN D) 1000 UNITS tablet Take 1,000 Units by mouth daily.  Marland Kitchen eplerenone (INSPRA) 25 MG tablet Take 12.5 mg by mouth daily.  . Ferrous Sulfate Dried 140 (45 FE) MG TBCR Take 1 tablet by mouth daily.  . fluticasone (FLONASE) 50 MCG/ACT nasal spray as needed.  . furosemide (LASIX) 40 MG tablet Take 40 mg by  mouth daily.   . hydroxyurea (HYDREA) 500 MG capsule Take 500 mg by mouth 2 (two) times daily.   Marland Kitchen losartan (COZAAR) 50 MG tablet Take 50 mg by mouth 2 (two) times daily.  . Misc Natural Products (GLUCOSAMINE CHONDROITIN COMPLX) TABS Take 1 tablet by mouth 2 (two) times daily.  Marland Kitchen MYFORTIC 180 MG EC tablet TAKE 2 TABLETS PO BID  . Omega-3 Fatty Acids (FISH OIL) 1000 MG CAPS Take 1 capsule by mouth daily.  . sodium bicarbonate 650 MG tablet Take 650 mg by mouth 2 (two) times daily.  . tacrolimus (PROGRAF) 1 MG capsule Take 1 mg by mouth 2 (two) times daily.   Marland Kitchen zolpidem (AMBIEN) 5 MG tablet Take 1 tablet (5 mg total) by mouth at bedtime as needed for sleep.  Marland Kitchen azelastine (ASTELIN) 0.1 % nasal spray as needed.  . cefdinir (OMNICEF) 300 MG capsule Take 1 capsule (300 mg total) by mouth 2 (two) times daily.  Marland Kitchen CRANBERRY PO Take by mouth daily.  . predniSONE (DELTASONE) 10 MG tablet Take 6 tablets x 1 day and then decrease by 1/2 tablet per day until down to zero mg.   No facility-administered encounter medications on file as of 07/13/2017.     Review of Systems  Constitutional: Positive for fatigue. Negative for appetite change and unexpected weight change.  HENT: Positive for congestion, postnasal drip and sore throat. Negative for sinus pressure.   Respiratory: Positive for cough. Negative for chest tightness and shortness of breath.   Cardiovascular: Negative for chest pain and leg swelling.  Gastrointestinal: Negative for abdominal pain, diarrhea, nausea and vomiting.  Musculoskeletal: Negative for joint swelling.       Body aches.    Skin: Negative for color change and rash.  Neurological: Negative for dizziness and headaches.  Psychiatric/Behavioral: Negative for agitation and dysphoric mood.       Objective:    Physical Exam  Constitutional: She appears well-developed and well-nourished. No distress.  HENT:  Nose: Nose normal.  Mouth/Throat: Oropharynx is clear and moist.  Neck:  Neck supple.  Cardiovascular: Normal rate and regular rhythm.  Pulmonary/Chest: Breath sounds normal. No respiratory distress. She has no wheezes.  Increased cough with forced expiration.  Lungs clear.   Abdominal: Soft. Bowel sounds are normal. There is no tenderness.  Musculoskeletal: She exhibits no edema or tenderness.  Lymphadenopathy:    She has no cervical adenopathy.  Skin: No rash noted. No erythema.  Psychiatric: She has a normal mood and affect. Her behavior is normal.  BP 130/68 (BP Location: Left Arm, Patient Position: Sitting, Cuff Size: Large)   Pulse 64   Temp 97.6 F (36.4 C) (Oral)   Resp 18   Wt 218 lb (98.9 kg)   SpO2 98%   BMI 39.87 kg/m  Wt Readings from Last 3 Encounters:  07/13/17 218 lb (98.9 kg)  08/30/16 211 lb (95.7 kg)  08/16/16 214 lb (97.1 kg)     Lab Results  Component Value Date   WBC 5.3 09/08/2015   HGB 11.3 09/08/2015   HCT 32.7 (L) 09/08/2015   PLT 425 (H) 09/08/2015   GLUCOSE 142 (H) 08/07/2015   CHOL 168 08/07/2015   TRIG 144 08/07/2015   HDL 32 (L) 08/07/2015   LDLCALC 107 (H) 08/07/2015   ALT 9 08/07/2015   AST 10 08/07/2015   NA 140 08/07/2015   K 4.7 08/07/2015   CL 103 08/07/2015   CREATININE 1.85 (H) 08/07/2015   BUN 58 (H) 08/07/2015   CO2 17 (L) 08/07/2015   TSH 4.400 08/07/2015       Assessment & Plan:   Problem List Items Addressed This Visit    Chronic kidney disease    S/p transplant.  Followed by nephrology.  Discussed labs.  She prefers to have drawn at [facility].  She will arrange.        Essential thrombocytosis (HCC)    On hydrea.  Followed by hematology.        Relevant Medications   cefdinir (OMNICEF) 300 MG capsule   predniSONE (DELTASONE) 10 MG tablet   URI (upper respiratory infection)    Increased cough and congestion as outlined.  Will hold on cxr.  Flu swab negative.  Concern over bacterial infection.  Will treat with omnicef as directed.  Restart flonase nasal spray.  Saline nasal spray if  needed.  She has been taking mucinex.  Treat with prednisone taper as outlined.  She has taken and tolerated well.  Discussed possible side effects and risk of medication.  Follow closely.        Relevant Medications   cefdinir (OMNICEF) 300 MG capsule    Other Visit Diagnoses    Body aches    -  Primary       Einar Pheasant, MD

## 2017-07-13 NOTE — Patient Instructions (Signed)
Take omnicef 300mg  twice a day  Take probiotic daily while you are on the antibiotics and for two weeks after completing the antibiotics.    Start flonase nasal spray - 2 sprays each nostril one time per day.  Do this in the evening.    Can flush with saline nasal spray during the day  Take the prednisone taper as directed.

## 2017-07-14 ENCOUNTER — Encounter: Payer: Self-pay | Admitting: Internal Medicine

## 2017-07-14 NOTE — Assessment & Plan Note (Signed)
On hydrea.  Followed by hematology.  

## 2017-07-14 NOTE — Assessment & Plan Note (Signed)
S/p transplant.  Followed by nephrology.  Discussed labs.  She prefers to have drawn at [facility].  She will arrange.

## 2017-07-14 NOTE — Assessment & Plan Note (Addendum)
Increased cough and congestion as outlined.  Will hold on cxr.  Flu swab negative.  Concern over bacterial infection.  Will treat with omnicef as directed.  Restart flonase nasal spray.  Saline nasal spray if needed.  She has been taking mucinex.  Treat with prednisone taper as outlined.  She has taken and tolerated well.  Discussed possible side effects and risk of medication.  Follow closely.

## 2017-08-04 MED ORDER — AZITHROMYCIN 250 MG TABLET
PACK | ORAL | 0 refills | 0.00000 days | Status: CP
Start: 2017-08-04 — End: 2017-08-04

## 2017-08-04 MED ORDER — AZITHROMYCIN 250 MG TABLET: Package | 0 refills | 0 days | Status: AC

## 2017-08-15 MED ORDER — CEPHALEXIN 250 MG CAPSULE
ORAL_CAPSULE | 10 refills | 0 days | Status: CP
Start: 2017-08-15 — End: 2018-04-11

## 2017-08-23 ENCOUNTER — Other Ambulatory Visit: Admit: 2017-08-23 | Discharge: 2017-08-24 | Payer: MEDICARE

## 2017-08-23 DIAGNOSIS — D473 Essential (hemorrhagic) thrombocythemia: Secondary | ICD-10-CM | POA: Diagnosis not present

## 2017-08-23 DIAGNOSIS — Z79899 Other long term (current) drug therapy: Secondary | ICD-10-CM | POA: Diagnosis not present

## 2017-08-23 DIAGNOSIS — Z940 Kidney transplant status: Secondary | ICD-10-CM | POA: Diagnosis not present

## 2017-08-26 ENCOUNTER — Ambulatory Visit: Admit: 2017-08-26 | Discharge: 2017-08-26 | Payer: MEDICARE

## 2017-08-26 DIAGNOSIS — I151 Hypertension secondary to other renal disorders: Secondary | ICD-10-CM | POA: Diagnosis not present

## 2017-08-26 DIAGNOSIS — R05 Cough: Secondary | ICD-10-CM | POA: Diagnosis not present

## 2017-08-26 DIAGNOSIS — R739 Hyperglycemia, unspecified: Secondary | ICD-10-CM | POA: Diagnosis not present

## 2017-08-26 DIAGNOSIS — N2889 Other specified disorders of kidney and ureter: Secondary | ICD-10-CM | POA: Diagnosis not present

## 2017-08-26 DIAGNOSIS — I5032 Chronic diastolic (congestive) heart failure: Secondary | ICD-10-CM | POA: Diagnosis not present

## 2017-08-26 DIAGNOSIS — Z79899 Other long term (current) drug therapy: Secondary | ICD-10-CM | POA: Diagnosis not present

## 2017-08-26 DIAGNOSIS — Z888 Allergy status to other drugs, medicaments and biological substances status: Secondary | ICD-10-CM | POA: Diagnosis not present

## 2017-08-26 DIAGNOSIS — I130 Hypertensive heart and chronic kidney disease with heart failure and stage 1 through stage 4 chronic kidney disease, or unspecified chronic kidney disease: Secondary | ICD-10-CM | POA: Diagnosis not present

## 2017-08-26 DIAGNOSIS — I5030 Unspecified diastolic (congestive) heart failure: Secondary | ICD-10-CM | POA: Diagnosis not present

## 2017-08-26 DIAGNOSIS — Z886 Allergy status to analgesic agent status: Secondary | ICD-10-CM | POA: Diagnosis not present

## 2017-08-26 DIAGNOSIS — Z6838 Body mass index (BMI) 38.0-38.9, adult: Secondary | ICD-10-CM | POA: Diagnosis not present

## 2017-08-26 DIAGNOSIS — I10 Essential (primary) hypertension: Secondary | ICD-10-CM | POA: Diagnosis not present

## 2017-08-26 DIAGNOSIS — Z7952 Long term (current) use of systemic steroids: Secondary | ICD-10-CM | POA: Diagnosis not present

## 2017-08-26 DIAGNOSIS — N189 Chronic kidney disease, unspecified: Secondary | ICD-10-CM | POA: Diagnosis not present

## 2017-08-26 DIAGNOSIS — K219 Gastro-esophageal reflux disease without esophagitis: Secondary | ICD-10-CM | POA: Diagnosis not present

## 2017-08-26 MED ORDER — EPLERENONE 25 MG TABLET
ORAL_TABLET | Freq: Every day | ORAL | 1 refills | 0.00000 days | Status: CP
Start: 2017-08-26 — End: 2018-04-11

## 2017-08-26 MED ORDER — PANTOPRAZOLE 40 MG TABLET,DELAYED RELEASE
ORAL_TABLET | Freq: Every day | ORAL | 0 refills | 0 days | Status: CP
Start: 2017-08-26 — End: 2017-10-11

## 2017-08-26 MED ORDER — PRAVASTATIN 40 MG TABLET
ORAL_TABLET | Freq: Every day | ORAL | 11 refills | 0.00000 days | Status: CP
Start: 2017-08-26 — End: 2017-10-10

## 2017-09-30 ENCOUNTER — Encounter: Payer: Self-pay | Admitting: Internal Medicine

## 2017-09-30 ENCOUNTER — Ambulatory Visit (INDEPENDENT_AMBULATORY_CARE_PROVIDER_SITE_OTHER): Payer: Medicare Other | Admitting: Internal Medicine

## 2017-09-30 ENCOUNTER — Other Ambulatory Visit (HOSPITAL_COMMUNITY)
Admission: RE | Admit: 2017-09-30 | Discharge: 2017-09-30 | Disposition: A | Discharge status: Home / Self Care | Payer: Medicare Other | Source: Ambulatory Visit | Attending: Internal Medicine | Admitting: Internal Medicine

## 2017-09-30 VITALS — BP 120/60 | HR 67 | Temp 97.8°F | Resp 18 | Ht 61.0 in | Wt 212.4 lb

## 2017-09-30 DIAGNOSIS — G4733 Obstructive sleep apnea (adult) (pediatric): Secondary | ICD-10-CM | POA: Diagnosis not present

## 2017-09-30 DIAGNOSIS — I509 Heart failure, unspecified: Secondary | ICD-10-CM | POA: Insufficient documentation

## 2017-09-30 DIAGNOSIS — I130 Hypertensive heart and chronic kidney disease with heart failure and stage 1 through stage 4 chronic kidney disease, or unspecified chronic kidney disease: Secondary | ICD-10-CM | POA: Diagnosis not present

## 2017-09-30 DIAGNOSIS — D473 Essential (hemorrhagic) thrombocythemia: Secondary | ICD-10-CM

## 2017-09-30 DIAGNOSIS — N189 Chronic kidney disease, unspecified: Secondary | ICD-10-CM | POA: Diagnosis not present

## 2017-09-30 DIAGNOSIS — D631 Anemia in chronic kidney disease: Secondary | ICD-10-CM | POA: Diagnosis not present

## 2017-09-30 DIAGNOSIS — E7800 Pure hypercholesterolemia, unspecified: Secondary | ICD-10-CM | POA: Diagnosis not present

## 2017-09-30 DIAGNOSIS — Z0000 Encounter for general adult medical examination without abnormal findings: Secondary | ICD-10-CM | POA: Insufficient documentation

## 2017-09-30 DIAGNOSIS — J329 Chronic sinusitis, unspecified: Secondary | ICD-10-CM | POA: Diagnosis not present

## 2017-09-30 DIAGNOSIS — Z940 Kidney transplant status: Secondary | ICD-10-CM | POA: Insufficient documentation

## 2017-09-30 DIAGNOSIS — L989 Disorder of the skin and subcutaneous tissue, unspecified: Secondary | ICD-10-CM

## 2017-09-30 DIAGNOSIS — G4730 Sleep apnea, unspecified: Secondary | ICD-10-CM | POA: Insufficient documentation

## 2017-09-30 DIAGNOSIS — Z124 Encounter for screening for malignant neoplasm of cervix: Secondary | ICD-10-CM | POA: Diagnosis not present

## 2017-09-30 DIAGNOSIS — Z1231 Encounter for screening mammogram for malignant neoplasm of breast: Secondary | ICD-10-CM | POA: Diagnosis not present

## 2017-09-30 DIAGNOSIS — M109 Gout, unspecified: Secondary | ICD-10-CM | POA: Insufficient documentation

## 2017-09-30 DIAGNOSIS — Z87891 Personal history of nicotine dependence: Secondary | ICD-10-CM | POA: Diagnosis not present

## 2017-09-30 DIAGNOSIS — Z1239 Encounter for other screening for malignant neoplasm of breast: Secondary | ICD-10-CM

## 2017-09-30 MED ORDER — ZOLPIDEM TARTRATE 5 MG PO TABS: 5.0000 mg | ORAL_TABLET | Freq: Every evening | ORAL | 0 refills | Status: AC | PRN

## 2017-09-30 MED ORDER — MUPIROCIN 2 % EX OINT: TOPICAL_OINTMENT | CUTANEOUS | 0 refills | Status: AC

## 2017-09-30 MED ORDER — AZITHROMYCIN 250 MG PO TABS: ORAL_TABLET | ORAL | 0 refills | Status: DC

## 2017-09-30 NOTE — Progress Notes (Signed)
Patient ID: Diane Guerra, female   DOB: [birth year], 67 y.o.   MRN: [id number]   Subjective:    Patient ID: Diane Guerra, female    DOB: 16-Dec-1950, 67 y.o.   MRN: [id number]  HPI  Patient with past history of CKD s/p renal transplant, sleep apnea and hypertension.  She comes in today to follow up on these issues as well as for a complete physical exam.  She is accompanied by her husband.  History obtained from both of them.  She reports she is doing relatively well.  Previously had increased sinus congestion.  Some productive nasal mucus.  She doubled her abx.  Is better.  No chest pain.  No sob.  No acid reflux.  No abdominal pain.  Bowels moving.  Recently had hydrea dose increased.  Followed by hematology.     Past Medical History:  Diagnosis Date  . Anemia   . Arthritis    knees  . CHF (congestive heart failure) ([place])   . Chronic kidney disease 2007   followed by Dr Juanito Doom  . Chronic UTI   . Essential thrombocytosis ([place])   . Gout   . Heart murmur   . Hypertension   . Kidney failure 2014  . Pulmonary edema 2014  . Sleep apnea with use of continuous positive airway pressure (CPAP)    Past Surgical History:  Procedure Laterality Date  . BREAST BIOPSY Left 1986   neg  . CATARACT EXTRACTION W/PHACO Right 08/16/2016   Procedure: CATARACT EXTRACTION PHACO AND INTRAOCULAR LENS PLACEMENT ([place])  Right eye;  Surgeon: Ronnell Freshwater, MD;  Location: [facility];  Service: Ophthalmology;  Laterality: Right;  sleep apnea  . CATARACT EXTRACTION W/PHACO Left 08/30/2016   Procedure: CATARACT EXTRACTION PHACO AND INTRAOCULAR LENS PLACEMENT ([place])  Left;  Surgeon: Ronnell Freshwater, MD;  Location: [facility];  Service: Ophthalmology;  Laterality: Left;  sleep apnea  . COLONOSCOPY    . HAND SURGERY    . KIDNEY TRANSPLANT  [id number]  . PORT-A-CATH REMOVAL    . PORTACATH PLACEMENT    . TUBAL LIGATION     Family History  Problem Relation Age of Onset  .  Kidney disease Father   . Heart disease Father        myocardial infarction  . Breast cancer Neg Hx    Social History   Socioeconomic History  . Marital status: Married    Spouse name: Not on file  . Number of children: Not on file  . Years of education: Not on file  . Highest education level: Not on file  Occupational History  . Not on file  Social Needs  . Financial resource strain: Not on file  . Food insecurity:    Worry: Not on file    Inability: Not on file  . Transportation needs:    Medical: Not on file    Non-medical: Not on file  Tobacco Use  . Smoking status: Former Smoker    Last attempt to quit: 07/12/2001    Years since quitting: 16.2  . Smokeless tobacco: Never Used  Substance and Sexual Activity  . Alcohol use: Yes    Alcohol/week: 1.2 oz    Types: 2 Glasses of wine per week    Comment: has an occasional galss of wine  . Drug use: No  . Sexual activity: Yes  Lifestyle  . Physical activity:    Days per week: Not on file    Minutes per session:  Not on file  . Stress: Not on file  Relationships  . Social connections:    Talks on phone: Not on file    Gets together: Not on file    Attends religious service: Not on file    Active member of club or organization: Not on file    Attends meetings of clubs or organizations: Not on file    Relationship status: Not on file  Other Topics Concern  . Not on file  Social History Narrative  . Not on file    Outpatient Encounter Medications as of 09/30/2017  Medication Sig  . acetaminophen (TYLENOL) 325 MG tablet Take 650 mg by mouth every 6 (six) hours as needed.  Marland Kitchen amLODipine (NORVASC) 5 MG tablet Take 5 mg by mouth daily.  Marland Kitchen aspirin 81 MG tablet Take 81 mg by mouth daily.  . carvedilol (COREG) 25 MG tablet Take 25 mg by mouth 2 (two) times daily with a meal.  . cephALEXin (KEFLEX) 250 MG capsule Take by mouth daily.  . cholecalciferol (VITAMIN D) 1000 UNITS tablet Take 1,000 Units by mouth daily.  Marland Kitchen  eplerenone (INSPRA) 25 MG tablet Take 12.5 mg by mouth daily.  . Ferrous Sulfate Dried 140 (45 FE) MG TBCR Take 1 tablet by mouth daily.  . fluticasone (FLONASE) 50 MCG/ACT nasal spray as needed.  . furosemide (LASIX) 40 MG tablet Take 40 mg by mouth daily.   . hydroxyurea (HYDREA) 500 MG capsule Take 500 mg by mouth 2 (two) times daily.   Marland Kitchen losartan (COZAAR) 50 MG tablet Take 50 mg by mouth 2 (two) times daily.  . Misc Natural Products (GLUCOSAMINE CHONDROITIN COMPLX) TABS Take 1 tablet by mouth 2 (two) times daily.  Marland Kitchen MYFORTIC 180 MG EC tablet TAKE 2 TABLETS PO BID  . Omega-3 Fatty Acids (FISH OIL) 1000 MG CAPS Take 1 capsule by mouth daily.  . pantoprazole (PROTONIX) 40 MG tablet   . pravastatin (PRAVACHOL) 40 MG tablet   . sodium bicarbonate 650 MG tablet Take 650 mg by mouth 2 (two) times daily.  . tacrolimus (PROGRAF) 1 MG capsule Take 1 mg by mouth 2 (two) times daily.   Marland Kitchen zolpidem (AMBIEN) 5 MG tablet Take 1 tablet (5 mg total) by mouth at bedtime as needed for sleep.  . [DISCONTINUED] zolpidem (AMBIEN) 5 MG tablet Take 1 tablet (5 mg total) by mouth at bedtime as needed for sleep.  Marland Kitchen allopurinol (ZYLOPRIM) 100 MG tablet Take 100 mg by mouth 2 (two) times daily.   Marland Kitchen azelastine (ASTELIN) 0.1 % nasal spray as needed.  Marland Kitchen azithromycin (ZITHROMAX) 250 MG tablet Take 2 tablets x 1 day and then one tablet per day for four more days.  . cefdinir (OMNICEF) 300 MG capsule Take 1 capsule (300 mg total) by mouth 2 (two) times daily. (Patient not taking: Reported on 09/30/2017)  . CRANBERRY PO Take by mouth daily.  . mupirocin ointment (BACTROBAN) 2 % Apply to affected area bid  . predniSONE (DELTASONE) 10 MG tablet Take 6 tablets x 1 day and then decrease by 1/2 tablet per day until down to zero mg. (Patient not taking: Reported on 09/30/2017)  . [DISCONTINUED] sodium bicarbonate 325 MG tablet    No facility-administered encounter medications on file as of 09/30/2017.     Review of Systems    Constitutional: Negative for appetite change and unexpected weight change.  HENT: Positive for congestion. Negative for sinus pressure.   Eyes: Negative for pain and visual disturbance.  Respiratory: Negative for cough, chest tightness and shortness of breath.   Cardiovascular: Negative for chest pain, palpitations and leg swelling.  Gastrointestinal: Negative for abdominal pain, diarrhea, nausea and vomiting.  Genitourinary: Negative for difficulty urinating and dysuria.  Musculoskeletal: Negative for joint swelling and myalgias.  Skin: Negative for color change and rash.  Neurological: Negative for dizziness, light-headedness and headaches.  Hematological: Negative for adenopathy. Does not bruise/bleed easily.  Psychiatric/Behavioral: Negative for agitation and dysphoric mood.       Objective:     Blood pressure rechecked by me:  120/62  Physical Exam  Constitutional: She is oriented to person, place, and time. She appears well-developed and well-nourished.  HENT:  Nose: Nose normal.  Mouth/Throat: Oropharynx is clear and moist.  Eyes: Right eye exhibits no discharge. Left eye exhibits no discharge. No scleral icterus.  Neck: Neck supple. No thyromegaly present.  Cardiovascular: Normal rate and regular rhythm.  Pulmonary/Chest: Breath sounds normal. No accessory muscle usage. No tachypnea. No respiratory distress. She has no decreased breath sounds. She has no wheezes. She has no rhonchi. Right breast exhibits no inverted nipple, no mass, no nipple discharge and no tenderness (no axillary adenopathy). Left breast exhibits no inverted nipple, no mass, no nipple discharge and no tenderness (no axilarry adenopathy).  Abdominal: Soft. Bowel sounds are normal. There is no tenderness.  Musculoskeletal: She exhibits no edema or tenderness.  Lymphadenopathy:    She has no cervical adenopathy.  Neurological: She is alert and oriented to person, place, and time.  Skin: Skin is warm. No  rash noted. No erythema.  Psychiatric: She has a normal mood and affect. Her behavior is normal.    BP 120/60 (BP Location: Left Arm, Patient Position: Sitting, Cuff Size: Large)   Pulse 67   Temp 97.8 F (36.6 C) (Oral)   Resp 18   Ht 5\' 1"  (1.549 m)   Wt 212 lb 6.4 oz (96.3 kg)   SpO2 98%   BMI 40.13 kg/m  Wt Readings from Last 3 Encounters:  09/30/17 212 lb 6.4 oz (96.3 kg)  07/13/17 218 lb (98.9 kg)  08/30/16 211 lb (95.7 kg)     Lab Results  Component Value Date   WBC 5.3 09/08/2015   HGB 11.3 09/08/2015   HCT 32.7 (L) 09/08/2015   PLT 425 (H) 09/08/2015   GLUCOSE 142 (H) 08/07/2015   CHOL 168 08/07/2015   TRIG 144 08/07/2015   HDL 32 (L) 08/07/2015   LDLCALC 107 (H) 08/07/2015   ALT 9 08/07/2015   AST 10 08/07/2015   NA 140 08/07/2015   K 4.7 08/07/2015   CL 103 08/07/2015   CREATININE 1.85 (H) 08/07/2015   BUN 58 (H) 08/07/2015   CO2 17 (L) 08/07/2015   TSH 4.400 08/07/2015       Assessment & Plan:   Problem List Items Addressed This Visit    Anemia in chronic kidney disease    CBC being followed by hematology.  Stable.       Chronic kidney disease    S/p transplant.  Followed by nephrology.  K[facility] being [facility].        Essential thrombocytosis (HCC)    On hydrea.  Dose just increased.  Followed by hematology.        Relevant Medications   azithromycin (ZITHROMAX) 250 MG tablet   Health care maintenance    Physical today 09/30/17.  Mammogram ordered.  Will schedule.  Colonoscopy ? 2010.  Hypercholesteremia    Low cholesterol diet and exercise.  On pravastatin.  Follow lipid panel and liver function tests.        Relevant Medications   pravastatin (PRAVACHOL) 40 MG tablet   Obstructive sleep apnea    Wearing cpap regularly.         Other Visit Diagnoses    Breast cancer screening    -  Primary   Relevant Orders   MM Digital Screening   Screening for cervical cancer       Relevant Orders   Cytology - PAP    Skin lesion       Has appt with dermatolgy 10/03/17.  rx for bactroban given if needed.     Sinusitis, unspecified chronicity, unspecified location       she increased her abx on her own.  symptoms improved.  nasacort nasal spray and saline nasal spray as directed.  zpak rx given if needed.     Relevant Medications   azithromycin (ZITHROMAX) 250 MG tablet       Einar Pheasant, MD

## 2017-09-30 NOTE — Assessment & Plan Note (Signed)
Physical today 09/30/17.  Mammogram ordered.  Will schedule.  Colonoscopy ? 2010.

## 2017-10-03 ENCOUNTER — Encounter: Payer: Self-pay | Admitting: Internal Medicine

## 2017-10-03 DIAGNOSIS — L578 Other skin changes due to chronic exposure to nonionizing radiation: Secondary | ICD-10-CM | POA: Diagnosis not present

## 2017-10-03 DIAGNOSIS — Z86018 Personal history of other benign neoplasm: Secondary | ICD-10-CM | POA: Diagnosis not present

## 2017-10-03 DIAGNOSIS — L570 Actinic keratosis: Secondary | ICD-10-CM | POA: Diagnosis not present

## 2017-10-03 NOTE — Assessment & Plan Note (Signed)
CBC being followed by hematology.  Stable.

## 2017-10-03 NOTE — Assessment & Plan Note (Signed)
Low cholesterol diet and exercise.  On pravastatin.  Follow lipid panel and liver function tests.   

## 2017-10-03 NOTE — Assessment & Plan Note (Signed)
Wearing cpap regularly.

## 2017-10-03 NOTE — Assessment & Plan Note (Signed)
S/p transplant.  Followed by nephrology.  Kidney function being followed at [facility].

## 2017-10-03 NOTE — Assessment & Plan Note (Signed)
On hydrea.  Dose just increased.  Followed by hematology.

## 2017-10-05 ENCOUNTER — Encounter: Payer: Self-pay | Admitting: Internal Medicine

## 2017-10-05 LAB — CYTOLOGY - PAP
Diagnosis: NEGATIVE
HPV: NOT DETECTED

## 2017-10-10 MED ORDER — PRAVASTATIN 40 MG TABLET
ORAL_TABLET | Freq: Every day | ORAL | 11 refills | 0.00000 days | Status: CP
Start: 2017-10-10 — End: 2018-04-11

## 2017-10-11 ENCOUNTER — Ambulatory Visit: Admit: 2017-10-11 | Discharge: 2017-10-11 | Payer: MEDICARE

## 2017-10-11 ENCOUNTER — Ambulatory Visit: Admit: 2017-10-11 | Discharge: 2017-10-11 | Payer: MEDICARE | Attending: Nephrology | Primary: Nephrology

## 2017-10-11 DIAGNOSIS — I272 Pulmonary hypertension, unspecified: Secondary | ICD-10-CM | POA: Diagnosis not present

## 2017-10-11 DIAGNOSIS — I5030 Unspecified diastolic (congestive) heart failure: Secondary | ICD-10-CM | POA: Diagnosis not present

## 2017-10-11 DIAGNOSIS — Z87440 Personal history of urinary (tract) infections: Secondary | ICD-10-CM | POA: Diagnosis not present

## 2017-10-11 DIAGNOSIS — Z5181 Encounter for therapeutic drug level monitoring: Secondary | ICD-10-CM | POA: Diagnosis not present

## 2017-10-11 DIAGNOSIS — N186 End stage renal disease: Secondary | ICD-10-CM | POA: Diagnosis not present

## 2017-10-11 DIAGNOSIS — Z7982 Long term (current) use of aspirin: Secondary | ICD-10-CM | POA: Diagnosis not present

## 2017-10-11 DIAGNOSIS — Z7952 Long term (current) use of systemic steroids: Secondary | ICD-10-CM | POA: Diagnosis not present

## 2017-10-11 DIAGNOSIS — Z23 Encounter for immunization: Secondary | ICD-10-CM | POA: Diagnosis not present

## 2017-10-11 DIAGNOSIS — M109 Gout, unspecified: Secondary | ICD-10-CM | POA: Diagnosis not present

## 2017-10-11 DIAGNOSIS — Z792 Long term (current) use of antibiotics: Secondary | ICD-10-CM | POA: Diagnosis not present

## 2017-10-11 DIAGNOSIS — K5520 Angiodysplasia of colon without hemorrhage: Secondary | ICD-10-CM | POA: Diagnosis not present

## 2017-10-11 DIAGNOSIS — R05 Cough: Secondary | ICD-10-CM | POA: Diagnosis not present

## 2017-10-11 DIAGNOSIS — Z940 Kidney transplant status: Secondary | ICD-10-CM | POA: Diagnosis not present

## 2017-10-11 DIAGNOSIS — Z4822 Encounter for aftercare following kidney transplant: Secondary | ICD-10-CM | POA: Diagnosis not present

## 2017-10-11 DIAGNOSIS — G4733 Obstructive sleep apnea (adult) (pediatric): Secondary | ICD-10-CM | POA: Diagnosis not present

## 2017-10-11 DIAGNOSIS — R0609 Other forms of dyspnea: Secondary | ICD-10-CM | POA: Diagnosis not present

## 2017-10-11 DIAGNOSIS — Z48298 Encounter for aftercare following other organ transplant: Secondary | ICD-10-CM | POA: Diagnosis not present

## 2017-10-11 DIAGNOSIS — Z7951 Long term (current) use of inhaled steroids: Secondary | ICD-10-CM | POA: Diagnosis not present

## 2017-10-11 DIAGNOSIS — M172 Bilateral post-traumatic osteoarthritis of knee: Secondary | ICD-10-CM | POA: Diagnosis not present

## 2017-10-11 DIAGNOSIS — Z79899 Other long term (current) drug therapy: Secondary | ICD-10-CM | POA: Diagnosis not present

## 2017-10-11 DIAGNOSIS — J449 Chronic obstructive pulmonary disease, unspecified: Secondary | ICD-10-CM | POA: Diagnosis not present

## 2017-10-11 DIAGNOSIS — D473 Essential (hemorrhagic) thrombocythemia: Secondary | ICD-10-CM | POA: Diagnosis not present

## 2017-10-11 DIAGNOSIS — I132 Hypertensive heart and chronic kidney disease with heart failure and with stage 5 chronic kidney disease, or end stage renal disease: Secondary | ICD-10-CM | POA: Diagnosis not present

## 2017-10-13 MED ORDER — PANTOPRAZOLE 40 MG TABLET,DELAYED RELEASE
ORAL_TABLET | Freq: Every day | ORAL | 11 refills | 0 days | Status: CP
Start: 2017-10-13 — End: 2018-04-11

## 2017-11-08 MED ORDER — DIPHENHYDRAMINE 25 MG CAPSULE
ORAL_CAPSULE | Freq: Every evening | ORAL | 2 refills | 0 days | PRN
Start: 2017-11-08 — End: 2018-11-08

## 2017-12-13 ENCOUNTER — Ambulatory Visit: Admit: 2017-12-13 | Discharge: 2017-12-14 | Payer: MEDICARE

## 2017-12-13 DIAGNOSIS — D473 Essential (hemorrhagic) thrombocythemia: Secondary | ICD-10-CM | POA: Diagnosis not present

## 2017-12-13 DIAGNOSIS — Z6837 Body mass index (BMI) 37.0-37.9, adult: Secondary | ICD-10-CM | POA: Diagnosis not present

## 2017-12-13 MED ORDER — PROGRAF 0.5 MG CAPSULE
ORAL_CAPSULE | 11 refills | 0 days | Status: CP
Start: 2017-12-13 — End: 2018-04-11

## 2017-12-17 MED ORDER — HYDROXYUREA 500 MG CAPSULE
ORAL_CAPSULE | Freq: Every day | ORAL | 4 refills | 0 days
Start: 2017-12-17 — End: 2018-09-24

## 2018-01-31 ENCOUNTER — Ambulatory Visit: Payer: Medicare Other | Admitting: Internal Medicine

## 2018-02-03 ENCOUNTER — Ambulatory Visit: Admit: 2018-02-03 | Discharge: 2018-02-04 | Payer: MEDICARE

## 2018-02-03 DIAGNOSIS — M25561 Pain in right knee: Secondary | ICD-10-CM | POA: Diagnosis not present

## 2018-02-03 DIAGNOSIS — G8929 Other chronic pain: Secondary | ICD-10-CM | POA: Diagnosis not present

## 2018-02-03 DIAGNOSIS — M1711 Unilateral primary osteoarthritis, right knee: Secondary | ICD-10-CM | POA: Diagnosis not present

## 2018-02-03 DIAGNOSIS — M170 Bilateral primary osteoarthritis of knee: Secondary | ICD-10-CM | POA: Diagnosis not present

## 2018-02-03 DIAGNOSIS — Z940 Kidney transplant status: Secondary | ICD-10-CM | POA: Diagnosis not present

## 2018-02-03 DIAGNOSIS — M1712 Unilateral primary osteoarthritis, left knee: Secondary | ICD-10-CM | POA: Diagnosis not present

## 2018-02-08 ENCOUNTER — Ambulatory Visit: Admit: 2018-02-08 | Discharge: 2018-02-09 | Payer: MEDICARE

## 2018-02-08 DIAGNOSIS — Z940 Kidney transplant status: Secondary | ICD-10-CM | POA: Diagnosis not present

## 2018-02-08 DIAGNOSIS — D899 Disorder involving the immune mechanism, unspecified: Secondary | ICD-10-CM | POA: Diagnosis not present

## 2018-02-08 DIAGNOSIS — Z79899 Other long term (current) drug therapy: Secondary | ICD-10-CM | POA: Diagnosis not present

## 2018-04-11 ENCOUNTER — Ambulatory Visit: Admit: 2018-04-11 | Discharge: 2018-04-11 | Payer: MEDICARE | Attending: Nephrology | Primary: Nephrology

## 2018-04-11 ENCOUNTER — Ambulatory Visit: Admit: 2018-04-11 | Discharge: 2018-04-11 | Payer: MEDICARE

## 2018-04-11 DIAGNOSIS — D473 Essential (hemorrhagic) thrombocythemia: Secondary | ICD-10-CM | POA: Diagnosis not present

## 2018-04-11 DIAGNOSIS — Z79899 Other long term (current) drug therapy: Secondary | ICD-10-CM | POA: Diagnosis not present

## 2018-04-11 DIAGNOSIS — I5030 Unspecified diastolic (congestive) heart failure: Secondary | ICD-10-CM | POA: Diagnosis not present

## 2018-04-11 DIAGNOSIS — Z48298 Encounter for aftercare following other organ transplant: Secondary | ICD-10-CM | POA: Diagnosis not present

## 2018-04-11 DIAGNOSIS — Z940 Kidney transplant status: Secondary | ICD-10-CM | POA: Diagnosis not present

## 2018-04-11 DIAGNOSIS — D899 Disorder involving the immune mechanism, unspecified: Secondary | ICD-10-CM | POA: Diagnosis not present

## 2018-04-11 DIAGNOSIS — I272 Pulmonary hypertension, unspecified: Secondary | ICD-10-CM | POA: Diagnosis not present

## 2018-04-11 DIAGNOSIS — Z23 Encounter for immunization: Secondary | ICD-10-CM | POA: Diagnosis not present

## 2018-04-11 DIAGNOSIS — Z6838 Body mass index (BMI) 38.0-38.9, adult: Secondary | ICD-10-CM | POA: Diagnosis not present

## 2018-04-11 MED ORDER — FUROSEMIDE 40 MG TABLET
ORAL_TABLET | Freq: Every day | ORAL | 3 refills | 0 days | Status: CP
Start: 2018-04-11 — End: 2018-05-15

## 2018-04-11 MED ORDER — CARVEDILOL 25 MG TABLET
ORAL_TABLET | Freq: Two times a day (BID) | ORAL | 3 refills | 0.00000 days | Status: CP
Start: 2018-04-11 — End: 2018-05-15

## 2018-04-11 MED ORDER — ZOLPIDEM 5 MG TABLET
ORAL_TABLET | Freq: Every evening | ORAL | 3 refills | 0 days | Status: CP | PRN
Start: 2018-04-11 — End: ?

## 2018-04-11 MED ORDER — PRAVASTATIN 40 MG TABLET
ORAL_TABLET | Freq: Every day | ORAL | 11 refills | 0 days | Status: CP
Start: 2018-04-11 — End: 2019-01-03

## 2018-04-11 MED ORDER — TELMISARTAN 40 MG TABLET
ORAL_TABLET | Freq: Two times a day (BID) | ORAL | 3 refills | 0 days | Status: CP
Start: 2018-04-11 — End: 2018-04-17

## 2018-04-11 MED ORDER — PANTOPRAZOLE 40 MG TABLET,DELAYED RELEASE
ORAL_TABLET | Freq: Every day | ORAL | 3 refills | 0 days | Status: CP
Start: 2018-04-11 — End: 2019-01-03

## 2018-04-11 MED ORDER — FLUTICASONE PROPIONATE 50 MCG/ACTUATION NASAL SPRAY,SUSPENSION
Freq: Two times a day (BID) | NASAL | 11 refills | 0 days | Status: CP | PRN
Start: 2018-04-11 — End: 2018-10-28

## 2018-04-11 MED ORDER — MYFORTIC 180 MG TABLET,DELAYED RELEASE
ORAL_TABLET | 3 refills | 0 days | Status: CP
Start: 2018-04-11 — End: 2019-01-02

## 2018-04-11 MED ORDER — CEPHALEXIN 250 MG CAPSULE
ORAL_CAPSULE | Freq: Every day | ORAL | 3 refills | 0 days | Status: CP
Start: 2018-04-11 — End: 2019-01-03

## 2018-04-11 MED ORDER — EPLERENONE 25 MG TABLET
ORAL_TABLET | Freq: Every day | ORAL | 1 refills | 0.00000 days
Start: 2018-04-11 — End: 2018-05-15

## 2018-04-11 MED ORDER — SODIUM BICARBONATE 325 MG TABLET
ORAL_TABLET | Freq: Two times a day (BID) | ORAL | 3 refills | 0.00000 days | Status: CP
Start: 2018-04-11 — End: 2019-01-03

## 2018-04-11 MED ORDER — PROGRAF 0.5 MG CAPSULE
ORAL_CAPSULE | 3 refills | 0 days | Status: CP
Start: 2018-04-11 — End: 2019-01-02

## 2018-04-17 MED ORDER — TELMISARTAN 80 MG TABLET
ORAL_TABLET | Freq: Every day | ORAL | 3 refills | 0.00000 days | Status: CP
Start: 2018-04-17 — End: 2018-05-11

## 2018-04-28 DIAGNOSIS — H43811 Vitreous degeneration, right eye: Secondary | ICD-10-CM | POA: Diagnosis not present

## 2018-04-28 DIAGNOSIS — Z961 Presence of intraocular lens: Secondary | ICD-10-CM | POA: Diagnosis not present

## 2018-04-28 DIAGNOSIS — H26491 Other secondary cataract, right eye: Secondary | ICD-10-CM | POA: Diagnosis not present

## 2018-05-11 MED ORDER — TELMISARTAN 80 MG TABLET
ORAL_TABLET | 3 refills | 0 days | Status: CP
Start: 2018-05-11 — End: 2018-07-20

## 2018-05-12 ENCOUNTER — Encounter: Payer: Self-pay | Admitting: Internal Medicine

## 2018-05-12 ENCOUNTER — Ambulatory Visit (INDEPENDENT_AMBULATORY_CARE_PROVIDER_SITE_OTHER): Payer: Medicare Other | Admitting: Internal Medicine

## 2018-05-12 VITALS — BP 120/58 | HR 64 | Temp 97.8°F | Resp 18 | Wt 216.0 lb

## 2018-05-12 DIAGNOSIS — D631 Anemia in chronic kidney disease: Secondary | ICD-10-CM | POA: Diagnosis not present

## 2018-05-12 DIAGNOSIS — D473 Essential (hemorrhagic) thrombocythemia: Secondary | ICD-10-CM | POA: Diagnosis not present

## 2018-05-12 DIAGNOSIS — E7800 Pure hypercholesterolemia, unspecified: Secondary | ICD-10-CM | POA: Diagnosis not present

## 2018-05-12 DIAGNOSIS — G4733 Obstructive sleep apnea (adult) (pediatric): Secondary | ICD-10-CM

## 2018-05-12 DIAGNOSIS — N189 Chronic kidney disease, unspecified: Secondary | ICD-10-CM | POA: Diagnosis not present

## 2018-05-12 DIAGNOSIS — R0602 Shortness of breath: Secondary | ICD-10-CM | POA: Diagnosis not present

## 2018-05-12 DIAGNOSIS — Q2730 Arteriovenous malformation, site unspecified: Secondary | ICD-10-CM

## 2018-05-12 NOTE — Progress Notes (Signed)
Patient ID: Diane Guerra, female   DOB: Jan 31, 1951, 67 y.o.   MRN: [id number]   Subjective:    Patient ID: Diane Guerra, female    DOB: [birth year], 67 y.o.   MRN: [id number]  HPI  Patient here for a scheduled follow up.  She is accompanied by her husband.  History obtained from both of them.  She has had problems with low blood pressure recently.  Medications have been adjusted by her kidney transplant team.  She is currently off losartan and telmisartan.  Only taking coreg now.  Last dose of 1/2 telmisartan was yesterday.  Blood pressure coming back up.  Does report some fatigue.  States gets more winded with exertion.  States may be from decreased activity, but has noticed a gradual change.  Previously stopped her protonix.  Started having increased burning and acid reflux.  Back on protonix now.  Symptoms better.  Previous constipation.  Better now.  No abdominal pain.  Added fiber to her diet.     Past Medical History:  Diagnosis Date  . Anemia   . Arthritis    knees  . CHF (congestive heart failure) ([place])   . Chronic kidney disease 2007   followed by Dr Juanito Doom  . Chronic UTI   . Essential thrombocytosis ([place])   . Gout   . Heart murmur   . Hypertension   . Kidney failure 2014  . Pulmonary edema 2014  . Sleep apnea with use of continuous positive airway pressure (CPAP)    Past Surgical History:  Procedure Laterality Date  . BREAST BIOPSY Left 1986   neg  . CATARACT EXTRACTION W/PHACO Right 08/16/2016   Procedure: CATARACT EXTRACTION PHACO AND INTRAOCULAR LENS PLACEMENT ([place])  Right eye;  Surgeon: Ronnell Freshwater, MD;  Location: [facility];  Service: Ophthalmology;  Laterality: Right;  sleep apnea  . CATARACT EXTRACTION W/PHACO Left 08/30/2016   Procedure: CATARACT EXTRACTION PHACO AND INTRAOCULAR LENS PLACEMENT ([place])  Left;  Surgeon: Ronnell Freshwater, MD;  Location: [facility];  Service: Ophthalmology;  Laterality: Left;  sleep apnea  .  COLONOSCOPY    . HAND SURGERY    . KIDNEY TRANSPLANT  [id number]  . PORT-A-CATH REMOVAL    . PORTACATH PLACEMENT    . TUBAL LIGATION     Family History  Problem Relation Age of Onset  . Kidney disease Father   . Heart disease Father        myocardial infarction  . Breast cancer Neg Hx    Social History   Socioeconomic History  . Marital status: Married    Spouse name: Not on file  . Number of children: Not on file  . Years of education: Not on file  . Highest education level: Not on file  Occupational History  . Not on file  Social Needs  . Financial resource strain: Not on file  . Food insecurity:    Worry: Not on file    Inability: Not on file  . Transportation needs:    Medical: Not on file    Non-medical: Not on file  Tobacco Use  . Smoking status: Former Smoker    Last attempt to quit: 07/12/2001    Years since quitting: 16.8  . Smokeless tobacco: Never Used  Substance and Sexual Activity  . Alcohol use: Yes    Alcohol/week: 2.0 standard drinks    Types: 2 Glasses of wine per week    Comment: has an occasional galss of wine  .  Drug use: No  . Sexual activity: Yes  Lifestyle  . Physical activity:    Days per week: Not on file    Minutes per session: Not on file  . Stress: Not on file  Relationships  . Social connections:    Talks on phone: Not on file    Gets together: Not on file    Attends religious service: Not on file    Active member of club or organization: Not on file    Attends meetings of clubs or organizations: Not on file    Relationship status: Not on file  Other Topics Concern  . Not on file  Social History Narrative  . Not on file    Outpatient Encounter Medications as of 05/12/2018  Medication Sig  . sodium bicarbonate 325 MG tablet Take by mouth.  Marland Kitchen acetaminophen (TYLENOL) 325 MG tablet Take 650 mg by mouth every 6 (six) hours as needed.  Marland Kitchen amLODipine (NORVASC) 5 MG tablet Take 5 mg by mouth daily.  Marland Kitchen aspirin 81 MG tablet Take 81 mg by  mouth daily.  Marland Kitchen azelastine (ASTELIN) 0.1 % nasal spray as needed.  . carvedilol (COREG) 25 MG tablet Take 25 mg by mouth 2 (two) times daily with a meal.  . cephALEXin (KEFLEX) 250 MG capsule Take by mouth daily.  . cholecalciferol (VITAMIN D) 1000 UNITS tablet Take 1,000 Units by mouth daily.  Marland Kitchen eplerenone (INSPRA) 25 MG tablet Take 12.5 mg by mouth daily.  . Ferrous Sulfate 140 (45 Fe) MG TBCR Take by mouth.  . fluticasone (FLONASE) 50 MCG/ACT nasal spray as needed.  . furosemide (LASIX) 40 MG tablet Take 40 mg by mouth daily.   . hydroxyurea (HYDREA) 500 MG capsule Take 500 mg by mouth 2 (two) times daily.   . Misc Natural Products (GLUCOSAMINE CHONDROITIN COMPLX) TABS Take 1 tablet by mouth 2 (two) times daily.  . mupirocin ointment (BACTROBAN) 2 % Apply to affected area bid  . MYFORTIC 180 MG EC tablet TAKE 2 TABLETS PO BID  . Omega-3 Fatty Acids (FISH OIL) 1000 MG CAPS Take 1 capsule by mouth daily.  . pantoprazole (PROTONIX) 40 MG tablet   . pravastatin (PRAVACHOL) 40 MG tablet   . tacrolimus (PROGRAF) 1 MG capsule Take 1 mg by mouth 2 (two) times daily.   Marland Kitchen zolpidem (AMBIEN) 5 MG tablet Take 1 tablet (5 mg total) by mouth at bedtime as needed for sleep.  . [DISCONTINUED] allopurinol (ZYLOPRIM) 100 MG tablet Take 100 mg by mouth 2 (two) times daily.   . [DISCONTINUED] azithromycin (ZITHROMAX) 250 MG tablet Take 2 tablets x 1 day and then one tablet per day for four more days.  . [DISCONTINUED] cefdinir (OMNICEF) 300 MG capsule Take 1 capsule (300 mg total) by mouth 2 (two) times daily. (Patient not taking: Reported on 09/30/2017)  . [DISCONTINUED] CRANBERRY PO Take by mouth daily.  . [DISCONTINUED] Ferrous Sulfate Dried 140 (45 FE) MG TBCR Take 1 tablet by mouth daily.  . [DISCONTINUED] losartan (COZAAR) 50 MG tablet Take 50 mg by mouth 2 (two) times daily.  . [DISCONTINUED] predniSONE (DELTASONE) 10 MG tablet Take 6 tablets x 1 day and then decrease by 1/2 tablet per day until down to  zero mg. (Patient not taking: Reported on 09/30/2017)  . [DISCONTINUED] sodium bicarbonate 650 MG tablet Take 650 mg by mouth 2 (two) times daily.   No facility-administered encounter medications on file as of 05/12/2018.     Review of Systems  Constitutional:  Negative for appetite change and unexpected weight change.  HENT: Negative for congestion and sinus pressure.   Respiratory: Negative for cough and chest tightness.        SOB with exertion.    Cardiovascular: Negative for chest pain, palpitations and leg swelling.  Gastrointestinal: Negative for abdominal pain, diarrhea, nausea and vomiting.  Genitourinary: Negative for difficulty urinating and dysuria.  Musculoskeletal: Negative for joint swelling and myalgias.  Skin: Negative for color change and rash.  Neurological: Negative for dizziness, light-headedness and headaches.  Psychiatric/Behavioral: Negative for agitation and dysphoric mood.       Objective:     Blood pressure rechecked by me:  118/60  Physical Exam  Constitutional: She appears well-developed and well-nourished. No distress.  HENT:  Nose: Nose normal.  Mouth/Throat: Oropharynx is clear and moist.  Neck: Neck supple. No thyromegaly present.  Cardiovascular: Normal rate and regular rhythm.  Pulmonary/Chest: Breath sounds normal. No respiratory distress. She has no wheezes.  Abdominal: Soft. Bowel sounds are normal. There is no tenderness.  Musculoskeletal: She exhibits no edema or tenderness.  Lymphadenopathy:    She has no cervical adenopathy.  Skin: No rash noted. No erythema.  Psychiatric: She has a normal mood and affect. Her behavior is normal.    BP (!) 120/58 (BP Location: Right Arm, Patient Position: Sitting, Cuff Size: Large)   Pulse 64   Temp 97.8 F (36.6 C) (Oral)   Resp 18   Wt 216 lb (98 kg)   SpO2 98%   BMI 40.81 kg/m  Wt Readings from Last 3 Encounters:  05/12/18 216 lb (98 kg)  09/30/17 212 lb 6.4 oz (96.3 kg)  07/13/17 218 lb  (98.9 kg)     Lab Results  Component Value Date   WBC 5.3 09/08/2015   HGB 11.3 09/08/2015   HCT 32.7 (L) 09/08/2015   PLT 425 (H) 09/08/2015   GLUCOSE 142 (H) 08/07/2015   CHOL 168 08/07/2015   TRIG 144 08/07/2015   HDL 32 (L) 08/07/2015   LDLCALC 107 (H) 08/07/2015   ALT 9 08/07/2015   AST 10 08/07/2015   NA 140 08/07/2015   K 4.7 08/07/2015   CL 103 08/07/2015   CREATININE 1.85 (H) 08/07/2015   BUN 58 (H) 08/07/2015   CO2 17 (L) 08/07/2015   TSH 4.400 08/07/2015       Assessment & Plan:   Problem List Items Addressed This Visit    Anemia in chronic kidney disease    Has seen hematology.  Just had cbc checked by transplant team.  Stable.  Follow.        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  Follow cbc.       Essential thrombocytosis ([place])    Followed by hematology.  On hydrea.        Hypercholesteremia    On pravastatin.  Low cholesterol diet and exercise.  Follow lipid panel and liver function tests.        Obstructive sleep apnea    CPAP.       SOB (shortness of breath) - Primary    SOB with exertion.  EKG - SR with minimal T wave inversion in I and aVL.  Non specific ST/T changes.  Discussed with them today.  Given symptoms, will refer back to Dr Delight Hoh.  Of note, has upcoming surgery in 09/2018.        Relevant  Orders   EKG 12-Lead (Completed)   Ambulatory referral to Cardiology       Einar Pheasant, MD

## 2018-05-15 ENCOUNTER — Encounter: Payer: Self-pay | Admitting: Internal Medicine

## 2018-05-15 MED ORDER — EPLERENONE 25 MG TABLET
ORAL_TABLET | 1 refills | 0 days
Start: 2018-05-15 — End: 2018-09-15

## 2018-05-15 MED ORDER — CARVEDILOL 25 MG TABLET
ORAL_TABLET | Freq: Two times a day (BID) | ORAL | 3 refills | 0 days
Start: 2018-05-15 — End: 2018-08-29

## 2018-05-15 MED ORDER — AMLODIPINE 5 MG TABLET
ORAL_TABLET | 3 refills | 0 days
Start: 2018-05-15 — End: 2018-05-16

## 2018-05-15 MED ORDER — FUROSEMIDE 40 MG TABLET
ORAL_TABLET | 3 refills | 0 days
Start: 2018-05-15 — End: 2018-05-17

## 2018-05-15 NOTE — Assessment & Plan Note (Signed)
Has seen hematology.  Just had cbc checked by transplant team.  Stable.  Follow.

## 2018-05-15 NOTE — Assessment & Plan Note (Signed)
CPAP.  

## 2018-05-15 NOTE — Assessment & Plan Note (Signed)
On pravastatin.  Low cholesterol diet and exercise.  Follow lipid panel and liver function tests.   

## 2018-05-15 NOTE — Assessment & Plan Note (Signed)
SOB with exertion.  EKG - SR with minimal T wave inversion in I and aVL.  Non specific ST/T changes.  Discussed with them today.  Given symptoms, will refer back to Dr Delight Hoh.  Of note, has upcoming surgery in 09/2018.

## 2018-05-15 NOTE — Assessment & Plan Note (Signed)
Found in small bowel.  Follow cbc.

## 2018-05-15 NOTE — Assessment & Plan Note (Signed)
Followed by hematology.  On hydrea.

## 2018-05-16 MED ORDER — CHOLECALCIFEROL (VITAMIN D3) 25 MCG (1,000 UNIT) TABLET
ORAL_TABLET | Freq: Every day | ORAL | 5 refills | 0.00000 days
Start: 2018-05-16 — End: 2018-06-29

## 2018-05-16 MED ORDER — AMLODIPINE 5 MG TABLET
ORAL_TABLET | Freq: Every day | ORAL | 3 refills | 0 days
Start: 2018-05-16 — End: 2018-06-20

## 2018-05-17 ENCOUNTER — Ambulatory Visit: Admit: 2018-05-17 | Discharge: 2018-05-18 | Payer: MEDICARE | Attending: Dermatology | Primary: Dermatology

## 2018-05-17 DIAGNOSIS — L578 Other skin changes due to chronic exposure to nonionizing radiation: Secondary | ICD-10-CM | POA: Diagnosis not present

## 2018-05-17 DIAGNOSIS — D0462 Carcinoma in situ of skin of left upper limb, including shoulder: Secondary | ICD-10-CM | POA: Diagnosis not present

## 2018-05-17 DIAGNOSIS — L821 Other seborrheic keratosis: Secondary | ICD-10-CM | POA: Diagnosis not present

## 2018-05-17 DIAGNOSIS — L570 Actinic keratosis: Secondary | ICD-10-CM | POA: Diagnosis not present

## 2018-05-17 DIAGNOSIS — Z940 Kidney transplant status: Principal | ICD-10-CM

## 2018-05-17 DIAGNOSIS — D485 Neoplasm of uncertain behavior of skin: Secondary | ICD-10-CM

## 2018-05-22 ENCOUNTER — Ambulatory Visit: Admit: 2018-05-22 | Discharge: 2018-05-22 | Payer: MEDICARE

## 2018-05-22 DIAGNOSIS — Z940 Kidney transplant status: Secondary | ICD-10-CM | POA: Diagnosis not present

## 2018-05-22 DIAGNOSIS — N271 Small kidney, bilateral: Secondary | ICD-10-CM | POA: Diagnosis not present

## 2018-05-22 DIAGNOSIS — Z79899 Other long term (current) drug therapy: Secondary | ICD-10-CM | POA: Diagnosis not present

## 2018-05-29 ENCOUNTER — Other Ambulatory Visit: Payer: Self-pay

## 2018-06-05 ENCOUNTER — Ambulatory Visit: Admit: 2018-06-05 | Discharge: 2018-06-06 | Payer: MEDICARE | Attending: Dermatology | Primary: Dermatology

## 2018-06-05 DIAGNOSIS — C4492 Squamous cell carcinoma of skin, unspecified: Secondary | ICD-10-CM | POA: Diagnosis not present

## 2018-06-05 DIAGNOSIS — D0462 Carcinoma in situ of skin of left upper limb, including shoulder: Secondary | ICD-10-CM | POA: Diagnosis not present

## 2018-06-19 ENCOUNTER — Ambulatory Visit
Admit: 2018-06-19 | Discharge: 2018-06-20 | Payer: MEDICARE | Attending: Physician Assistant | Primary: Physician Assistant

## 2018-06-19 ENCOUNTER — Ambulatory Visit: Admit: 2018-06-19 | Discharge: 2018-06-20 | Payer: MEDICARE

## 2018-06-19 DIAGNOSIS — N182 Chronic kidney disease, stage 2 (mild): Secondary | ICD-10-CM | POA: Diagnosis not present

## 2018-06-19 DIAGNOSIS — Z940 Kidney transplant status: Secondary | ICD-10-CM | POA: Diagnosis not present

## 2018-06-19 DIAGNOSIS — D631 Anemia in chronic kidney disease: Secondary | ICD-10-CM | POA: Diagnosis not present

## 2018-06-19 DIAGNOSIS — Z79899 Other long term (current) drug therapy: Secondary | ICD-10-CM | POA: Diagnosis not present

## 2018-06-19 DIAGNOSIS — C4491 Basal cell carcinoma of skin, unspecified: Principal | ICD-10-CM

## 2018-06-20 MED ORDER — AMLODIPINE 5 MG TABLET
ORAL_TABLET | 3 refills | 0 days | Status: CP
Start: 2018-06-20 — End: 2018-07-20

## 2018-06-26 MED ORDER — AZITHROMYCIN 250 MG TABLET
PACK | 0 refills | 0 days | Status: CP
Start: 2018-06-26 — End: 2018-07-01

## 2018-06-27 ENCOUNTER — Ambulatory Visit: Admit: 2018-06-27 | Discharge: 2018-06-28 | Payer: MEDICARE

## 2018-06-27 DIAGNOSIS — Z6839 Body mass index (BMI) 39.0-39.9, adult: Secondary | ICD-10-CM | POA: Diagnosis not present

## 2018-06-27 DIAGNOSIS — D473 Essential (hemorrhagic) thrombocythemia: Secondary | ICD-10-CM | POA: Diagnosis not present

## 2018-06-29 MED ORDER — CHOLECALCIFEROL (VITAMIN D3) 25 MCG (1,000 UNIT) TABLET
ORAL_TABLET | Freq: Every day | ORAL | 5 refills | 0 days
Start: 2018-06-29 — End: 2019-01-03

## 2018-07-20 MED ORDER — LOSARTAN 50 MG TABLET
ORAL_TABLET | Freq: Two times a day (BID) | ORAL | 3 refills | 0 days
Start: 2018-07-20 — End: 2018-08-25

## 2018-07-20 MED ORDER — AMLODIPINE 5 MG TABLET
ORAL_TABLET | Freq: Two times a day (BID) | ORAL | 3 refills | 0.00000 days | Status: CP
Start: 2018-07-20 — End: 2019-01-03

## 2018-07-21 ENCOUNTER — Ambulatory Visit: Admit: 2018-07-21 | Discharge: 2018-07-22 | Payer: MEDICARE

## 2018-07-21 DIAGNOSIS — Z79899 Other long term (current) drug therapy: Secondary | ICD-10-CM | POA: Diagnosis not present

## 2018-07-21 DIAGNOSIS — Z940 Kidney transplant status: Secondary | ICD-10-CM | POA: Diagnosis not present

## 2018-08-20 ENCOUNTER — Telehealth: Payer: Medicare Other | Admitting: Physician Assistant

## 2018-08-20 ENCOUNTER — Encounter: Payer: Self-pay | Admitting: Internal Medicine

## 2018-08-20 DIAGNOSIS — D849 Immunodeficiency, unspecified: Secondary | ICD-10-CM

## 2018-08-20 DIAGNOSIS — D899 Disorder involving the immune mechanism, unspecified: Secondary | ICD-10-CM

## 2018-08-20 DIAGNOSIS — J069 Acute upper respiratory infection, unspecified: Secondary | ICD-10-CM

## 2018-08-20 NOTE — Progress Notes (Signed)
Based on what you shared with me it looks like you have a condition that should be evaluated in a face to face office visit. Symptoms seem viral in nature but giving your significant history and immunocompromised state, I feel antibiotics may be warranted. I have strict guidelines I must follow via e-visit since I am not seeing you in person. In your case, I would recommend seeing Dr. Nicki Reaper in the morning to have a good assessment, especially lung examination so the most appropriate and safe treatment can be given.   NOTE: If you entered your credit card information for this eVisit, you will not be charged. You may see a "hold" on your card for the $30 but that hold will drop off and you will not have a charge processed.  If you are having a true medical emergency please call 911.  If you need an urgent face to face visit, [facility] has four urgent care centers for your convenience.  If you need care fast and have a high deductible or no insurance consider:   [email or web address] to reserve your spot online an avoid wait times  [facility] [address], [address] [place], [place] [address] 8 am to 8 pm Monday-Friday 10 am to 4 pm Saturday-Sunday *Across the street from [employer]  [place], [address] 8 am to 5 pm Monday-Friday * In the [facility] on the [facility]   The following sites will take your  insurance:  . [facility]gent [place] a Provider at this Location  [address] [place], [place] [address] . 10 am to 8 pm Monday-Friday . 12 pm to 8 pm Saturday-Sunday   . [facility] at [place] a Provider at this Location  [place], [place], [place] [address] . 8 am to 8 pm Monday-Friday . 9 am to 6 pm Saturday . 11 am to 6 pm Sunday   . [facility]  at [place] Get Driving Directions  [address] [place], [place] [address] . 8 am to 8 pm Monday-Friday . 8 am to 4 pm Saturday-Sunday   Your e-visit answers were reviewed by a board certified advanced clinical practitioner to complete your personal care plan.  Thank you for using e-Visits.

## 2018-08-21 ENCOUNTER — Encounter: Payer: Self-pay | Admitting: Emergency Medicine

## 2018-08-21 ENCOUNTER — Other Ambulatory Visit: Payer: Self-pay

## 2018-08-21 ENCOUNTER — Ambulatory Visit: Payer: Medicare Other | Admitting: Family Medicine

## 2018-08-21 ENCOUNTER — Ambulatory Visit
Admission: EM | Admit: 2018-08-21 | Discharge: 2018-08-21 | Disposition: A | Discharge status: Home / Self Care | Payer: Medicare Other | Attending: Family Medicine | Admitting: Family Medicine

## 2018-08-21 DIAGNOSIS — J101 Influenza due to other identified influenza virus with other respiratory manifestations: Secondary | ICD-10-CM | POA: Diagnosis not present

## 2018-08-21 LAB — RAPID INFLUENZA A&B ANTIGENS
Influenza A (ARMC): NEGATIVE
Influenza B (ARMC): POSITIVE — AB

## 2018-08-21 MED ORDER — BENZONATATE 200 MG PO CAPS: ORAL_CAPSULE | ORAL | 0 refills | Status: DC

## 2018-08-21 MED ORDER — OSELTAMIVIR PHOSPHATE 75 MG PO CAPS: 75.0000 mg | ORAL_CAPSULE | Freq: Two times a day (BID) | ORAL | 0 refills | Status: DC

## 2018-08-21 MED ORDER — ALBUTEROL SULFATE HFA 108 (90 BASE) MCG/ACT IN AERS: 1.0000 | INHALATION_SPRAY | Freq: Four times a day (QID) | RESPIRATORY_TRACT | 0 refills | Status: AC | PRN

## 2018-08-21 NOTE — Telephone Encounter (Signed)
Given the labored breathing, O2 desaturation and current acute symptoms, agree with need for ER evaluation.  Given she is already this sick, she needs to be seen and with the O2 dropping and labored breathing - ER is recommended to get complete w/up.

## 2018-08-21 NOTE — Telephone Encounter (Signed)
Patient was seen at [facility] urgent care today

## 2018-08-21 NOTE — Telephone Encounter (Signed)
Copied from [place]. Topic: General - Call Back - No Documentation >> Aug 21, 2018  9:06 AM Diane Guerra wrote: Reason for CRM: pt is returning missed calls, no crm. Please call back  [phone]

## 2018-08-21 NOTE — ED Triage Notes (Signed)
Pt c/o nasal congestion, shortness of breath, chest congestion, fever(101), body aches, and cough. Started 3 days ago.

## 2018-08-21 NOTE — Discharge Instructions (Addendum)
Drink plenty of fluids.  Rest as much as possible.  Use Tylenol or Motrin for fever chills or body aches.  °

## 2018-08-21 NOTE — ED Provider Notes (Signed)
[facility]    CSN: [id number] Arrival date & time: 08/21/18  1410     History   Chief Complaint Chief Complaint  Patient presents with  . Cough    HPI Diane Guerra is a 68 y.o. female.   HPI  68 year old female is with nasal congestion shortness of breath chest congestion fever of 101 yesterday body aches and cough that started about 3 days ago.  Flu shot earlier in the year.  Has multiple chronic problems including chronic kidney disease, pulmonary edem,l thrombocytopenia congestive heart failure.  Undergone a kidney transplant 2014      Past Medical History:  Diagnosis Date  . Anemia   . Arthritis    knees  . CHF (congestive heart failure) ([place])   . Chronic kidney disease 2007   followed by Dr Juanito Doom  . Chronic UTI   . Essential thrombocytosis ([place])   . Gout   . Heart murmur   . Hypertension   . Kidney failure 2014  . Pulmonary edema 2014  . Sleep apnea with use of continuous positive airway pressure (CPAP)     Patient Active Problem List   Diagnosis Date Noted  . Hypercholesteremia 09/30/2017  . SOB (shortness of breath) 08/03/2015  . Edema, lower extremity 09/02/2014  . Cough 09/02/2014  . Health care maintenance 09/02/2014  . Pain of fifth toe 04/10/2014  . Knee pain 10/14/2013  . Pulmonary edema 01/07/2013  . Chronic kidney disease 01/07/2013  . Anemia in chronic kidney disease 01/07/2013  . Essential thrombocytosis ([place]) 01/07/2013  . Obstructive sleep apnea 01/07/2013  . Gout 01/07/2013  . AVM (arteriovenous malformation) 01/07/2013    Past Surgical History:  Procedure Laterality Date  . BREAST BIOPSY Left 1986   neg  . CATARACT EXTRACTION W/PHACO Right 08/16/2016   Procedure: CATARACT EXTRACTION PHACO AND INTRAOCULAR LENS PLACEMENT ([place])  Right eye;  Surgeon: Ronnell Freshwater, MD;  Location: [place];  Service: Ophthalmology;  Laterality: Right;  sleep apnea  . CATARACT EXTRACTION W/PHACO Left 08/30/2016   Procedure: CATARACT EXTRACTION PHACO AND INTRAOCULAR LENS PLACEMENT ([place])  Left;  Surgeon: Ronnell Freshwater, MD;  Location: [place];  Service: Ophthalmology;  Laterality: Left;  sleep apnea  . COLONOSCOPY    . HAND SURGERY    . KIDNEY TRANSPLANT  [id number]  . PORT-A-CATH REMOVAL    . PORTACATH PLACEMENT    . TUBAL LIGATION      OB History   No obstetric history on file.      Home Medications    Prior to Admission medications   Medication Sig Start Date End Date Taking? Authorizing Provider  acetaminophen (TYLENOL) 325 MG tablet Take 650 mg by mouth every 6 (six) hours as needed.   Yes [provider]  amLODipine (NORVASC) 5 MG tablet Take 5 mg by mouth daily.   Yes [provider]  aspirin 81 MG tablet Take 81 mg by mouth daily.   Yes [provider]  azelastine (ASTELIN) 0.1 % nasal spray as needed. 06/03/15 08/21/18 Yes [provider]  carvedilol (COREG) 25 MG tablet Take 25 mg by mouth 2 (two) times daily with a meal.   Yes [provider]  cephALEXin (KEFLEX) 250 MG capsule Take by mouth daily.   Yes [provider]  cholecalciferol (VITAMIN D) 1000 UNITS tablet Take 1,000 Units by mouth daily.   Yes [provider]  eplerenone (INSPRA) 25 MG tablet Take 12.5 mg by mouth daily.  Yes [provider]  Ferrous Sulfate 140 (45 Fe) MG TBCR Take by mouth.   Yes [provider]  fluticasone (FLONASE) 50 MCG/ACT nasal spray as needed. 04/22/15  Yes [provider]  furosemide (LASIX) 40 MG tablet Take 40 mg by mouth daily.    Yes [provider]  hydroxyurea (HYDREA) 500 MG capsule Take 500 mg by mouth 2 (two) times daily.    Yes [provider]  Misc Natural Products (GLUCOSAMINE CHONDROITIN COMPLX) TABS Take 1 tablet by mouth 2 (two) times daily.   Yes [provider]  MYFORTIC 180 MG EC tablet TAKE 2 TABLETS PO BID 07/08/15  Yes [provider]   Omega-3 Fatty Acids (FISH OIL) 1000 MG CAPS Take 1 capsule by mouth daily.   Yes [provider]  pantoprazole (PROTONIX) 40 MG tablet  08/26/17  Yes [provider]  pravastatin (PRAVACHOL) 40 MG tablet  08/26/17  Yes [provider]  tacrolimus (PROGRAF) 1 MG capsule Take 1 mg by mouth 2 (two) times daily.    Yes [provider]  zolpidem (AMBIEN) 5 MG tablet Take 1 tablet (5 mg total) by mouth at bedtime as needed for sleep. 09/30/17  Yes Einar Pheasant, MD  albuterol (PROVENTIL HFA;VENTOLIN HFA) 108 (90 Base) MCG/ACT inhaler Inhale 1-2 puffs into the lungs every 6 (six) hours as needed for wheezing or shortness of breath. Use with spacer 08/21/18   Lorin Picket, PA-C  benzonatate (TESSALON) 200 MG capsule Take one cap TID PRN cough 08/21/18   Lorin Picket, PA-C  mupirocin ointment (BACTROBAN) 2 % Apply to affected area bid 09/30/17   Einar Pheasant, MD  oseltamivir (TAMIFLU) 75 MG capsule Take 1 capsule (75 mg total) by mouth every 12 (twelve) hours. 08/21/18   Lorin Picket, PA-C  sodium bicarbonate 325 MG tablet Take by mouth. 04/11/18 04/11/19  [provider]    Family History Family History  Problem Relation Age of Onset  . Kidney disease Father   . Heart disease Father        myocardial infarction  . Breast cancer Neg Hx     Social History Social History   Tobacco Use  . Smoking status: Former Smoker    Last attempt to quit: 07/12/2001    Years since quitting: 17.1  . Smokeless tobacco: Never Used  Substance Use Topics  . Alcohol use: Yes    Alcohol/week: 2.0 standard drinks    Types: 2 Glasses of wine per week    Comment: has an occasional galss of wine  . Drug use: No     Allergies   Macrobid [nitrofurantoin]   Review of Systems Review of Systems  Constitutional: Positive for activity change, appetite change, chills, fatigue and fever.  HENT: Positive for congestion.   Respiratory: Positive for cough and  shortness of breath.   All other systems reviewed and are negative.    Physical Exam Triage Vital Signs ED Triage Vitals  Enc Vitals Group     BP 08/21/18 1431 140/62     Pulse Rate 08/21/18 1431 64     Resp 08/21/18 1431 (!) 22     Temp 08/21/18 1431 98.5 F (36.9 C)     Temp Source 08/21/18 1431 Oral     SpO2 08/21/18 1431 93 %     Weight 08/21/18 1425 217 lb (98.4 kg)     Height 08/21/18 1425 5\' 2"  (1.575 m)     Head Circumference --  Peak Flow --      Pain Score 08/21/18 1425 5     Pain Loc --      Pain Edu? --      Excl. in [place]? --    No data found.  Updated Vital Signs BP 140/62 (BP Location: Left Arm)   Pulse 64   Temp 98.5 F (36.9 C) (Oral)   Resp (!) 22   Ht 5\' 2"  (1.575 m)   Wt 217 lb (98.4 kg)   SpO2 93%   BMI 39.69 kg/m   Visual Acuity Right Eye Distance:   Left Eye Distance:   Bilateral Distance:    Right Eye Near:   Left Eye Near:    Bilateral Near:     Physical Exam Vitals signs and nursing note reviewed.  Constitutional:      General: She is not in acute distress.    Appearance: She is ill-appearing. She is not toxic-appearing or diaphoretic.  HENT:     Head: Normocephalic and atraumatic.     Right Ear: Tympanic membrane and ear canal normal.     Left Ear: Tympanic membrane and ear canal normal.     Nose: Nose normal.     Mouth/Throat:     Mouth: Mucous membranes are moist.  Eyes:     General:        Right eye: No discharge.        Left eye: No discharge.     Conjunctiva/sclera: Conjunctivae normal.  Neck:     Musculoskeletal: Normal range of motion and neck supple.  Pulmonary:     Effort: Pulmonary effort is normal.     Breath sounds: Normal breath sounds.  Musculoskeletal: Normal range of motion.  Lymphadenopathy:     Cervical: No cervical adenopathy.  Skin:    General: Skin is warm and dry.  Neurological:     General: No focal deficit present.     Mental Status: She is alert and oriented to person, place, and time.    Psychiatric:        Mood and Affect: Mood normal.        Behavior: Behavior normal.        Thought Content: Thought content normal.        Judgment: Judgment normal.      UC Treatments / Results  Labs (all labs ordered are listed, but only abnormal results are displayed) Labs Reviewed  RAPID INFLUENZA A&B ANTIGENS ([facility] ONLY) - Abnormal; Notable for the following components:      Result Value   Influenza B ([facility]) POSITIVE (*)    All other components within normal limits    EKG None  Radiology No results found.  Procedures Procedures (including critical care time)  Medications Ordered in UC Medications - No data to display  Initial Impression / Assessment and Plan / UC Course  I have reviewed the triage vital signs and the nursing notes.  Pertinent labs & imaging results that were available during my care of the patient were reviewed by me and considered in my medical decision making (see chart for details).   Has influenza B.  Her on Tamiflu.  Was told kidney physician that she can take Tamiflu without problem.  Recommend the use of Tylenol for fever chills or body aches.  Drink plenty of fluids.  If she worsens should go to the emergency room otherwise follow-up with primary care physician.   Final Clinical Impressions(s) / UC Diagnoses   Final diagnoses:  Influenza  due to influenza virus, type B     Discharge Instructions     Drink plenty of fluids.  Rest as much as possible.  Use Tylenol or Motrin for fever chills or body aches.    ED Prescriptions    Medication Sig Dispense Auth. Provider   oseltamivir (TAMIFLU) 75 MG capsule Take 1 capsule (75 mg total) by mouth every 12 (twelve) hours. 10 capsule Crecencio Mc P, PA-C   albuterol (PROVENTIL HFA;VENTOLIN HFA) 108 (90 Base) MCG/ACT inhaler Inhale 1-2 puffs into the lungs every 6 (six) hours as needed for wheezing or shortness of breath. Use with spacer 1 Inhaler Lorin Picket, PA-C   benzonatate  (TESSALON) 200 MG capsule Take one cap TID PRN cough 30 capsule Lorin Picket, PA-C     Controlled Substance Prescriptions [place] Controlled Substance Registry consulted? Not Applicable   Lorin Picket, PA-C 08/21/18 1549

## 2018-08-21 NOTE — Telephone Encounter (Signed)
Spoke with patients husband. Patient is having body aches, fever (highest 101), chest congestion, productive cough, diarrhea, and oxygen saturation dropping down to 80% when walking from one room to the next. These sx started Friday. She did an e-visit and they recommended that she be seen in the office ASAP. Advised that it would be better for her to go to the ER to be evaluated so that she can get the proper care. Advised that she may need fluids, cxr, labs, etc and she would be able to get all of that there. Patients husband stated that they have been advised by nephrology to avoid ER at all costs if possible due to increased level of infection and pt being immunocompromised. Advised understanding and advised that he should still take her given her symptoms. Patients husband refused, still wanted appt and stated that they would call her other doctors and see what they should do.

## 2018-08-25 MED ORDER — LOSARTAN 50 MG TABLET
0 refills | 0 days | Status: CP
Start: 2018-08-25 — End: 2018-11-13

## 2018-08-28 ENCOUNTER — Ambulatory Visit: Payer: Medicare Other | Admitting: Internal Medicine

## 2018-08-29 ENCOUNTER — Ambulatory Visit: Admit: 2018-08-29 | Discharge: 2018-08-29 | Payer: MEDICARE

## 2018-08-29 ENCOUNTER — Ambulatory Visit: Admit: 2018-08-29 | Discharge: 2018-08-29 | Payer: MEDICARE | Attending: Nephrology | Primary: Nephrology

## 2018-08-29 DIAGNOSIS — D473 Essential (hemorrhagic) thrombocythemia: Secondary | ICD-10-CM | POA: Diagnosis not present

## 2018-08-29 DIAGNOSIS — Z940 Kidney transplant status: Secondary | ICD-10-CM | POA: Diagnosis not present

## 2018-08-29 DIAGNOSIS — M170 Bilateral primary osteoarthritis of knee: Secondary | ICD-10-CM | POA: Diagnosis not present

## 2018-08-29 DIAGNOSIS — N186 End stage renal disease: Secondary | ICD-10-CM | POA: Diagnosis not present

## 2018-08-29 DIAGNOSIS — Z48298 Encounter for aftercare following other organ transplant: Secondary | ICD-10-CM | POA: Diagnosis not present

## 2018-08-29 DIAGNOSIS — D899 Disorder involving the immune mechanism, unspecified: Secondary | ICD-10-CM | POA: Diagnosis not present

## 2018-08-29 DIAGNOSIS — Z6839 Body mass index (BMI) 39.0-39.9, adult: Secondary | ICD-10-CM | POA: Diagnosis not present

## 2018-08-29 DIAGNOSIS — Z4822 Encounter for aftercare following kidney transplant: Secondary | ICD-10-CM | POA: Diagnosis not present

## 2018-08-29 DIAGNOSIS — Z79899 Other long term (current) drug therapy: Secondary | ICD-10-CM | POA: Diagnosis not present

## 2018-08-29 DIAGNOSIS — J449 Chronic obstructive pulmonary disease, unspecified: Secondary | ICD-10-CM | POA: Diagnosis not present

## 2018-08-29 DIAGNOSIS — K8020 Calculus of gallbladder without cholecystitis without obstruction: Secondary | ICD-10-CM | POA: Diagnosis not present

## 2018-08-29 DIAGNOSIS — I5032 Chronic diastolic (congestive) heart failure: Secondary | ICD-10-CM | POA: Diagnosis not present

## 2018-08-29 DIAGNOSIS — I132 Hypertensive heart and chronic kidney disease with heart failure and with stage 5 chronic kidney disease, or end stage renal disease: Secondary | ICD-10-CM | POA: Diagnosis not present

## 2018-08-29 DIAGNOSIS — G4733 Obstructive sleep apnea (adult) (pediatric): Secondary | ICD-10-CM | POA: Diagnosis not present

## 2018-08-29 MED ORDER — CARVEDILOL 25 MG TABLET
ORAL_TABLET | Freq: Two times a day (BID) | ORAL | 3 refills | 0 days
Start: 2018-08-29 — End: 2019-01-03

## 2018-08-29 MED ORDER — AZITHROMYCIN 250 MG TABLET
PACK | 0 refills | 0 days | Status: CP
Start: 2018-08-29 — End: 2018-09-03

## 2018-08-29 MED ORDER — FUROSEMIDE 40 MG TABLET
ORAL_TABLET | Freq: Every day | ORAL | 11 refills | 0 days | Status: CP
Start: 2018-08-29 — End: 2019-01-03

## 2018-09-12 ENCOUNTER — Ambulatory Visit: Admit: 2018-09-12 | Discharge: 2018-09-12 | Payer: MEDICARE

## 2018-09-12 DIAGNOSIS — M25562 Pain in left knee: Secondary | ICD-10-CM | POA: Diagnosis not present

## 2018-09-12 DIAGNOSIS — M1712 Unilateral primary osteoarthritis, left knee: Secondary | ICD-10-CM | POA: Diagnosis not present

## 2018-09-12 DIAGNOSIS — M170 Bilateral primary osteoarthritis of knee: Secondary | ICD-10-CM | POA: Diagnosis not present

## 2018-09-12 DIAGNOSIS — Z01818 Encounter for other preprocedural examination: Principal | ICD-10-CM

## 2018-09-12 DIAGNOSIS — Z5181 Encounter for therapeutic drug level monitoring: Secondary | ICD-10-CM

## 2018-09-12 DIAGNOSIS — Z7901 Long term (current) use of anticoagulants: Secondary | ICD-10-CM

## 2018-09-12 DIAGNOSIS — Z01812 Encounter for preprocedural laboratory examination: Principal | ICD-10-CM

## 2018-09-15 ENCOUNTER — Ambulatory Visit: Admit: 2018-09-15 | Discharge: 2018-09-16 | Payer: MEDICARE

## 2018-09-15 DIAGNOSIS — I5030 Unspecified diastolic (congestive) heart failure: Secondary | ICD-10-CM | POA: Diagnosis not present

## 2018-09-15 DIAGNOSIS — I151 Hypertension secondary to other renal disorders: Secondary | ICD-10-CM | POA: Diagnosis not present

## 2018-09-15 DIAGNOSIS — Z6839 Body mass index (BMI) 39.0-39.9, adult: Secondary | ICD-10-CM | POA: Diagnosis not present

## 2018-09-15 DIAGNOSIS — R0602 Shortness of breath: Secondary | ICD-10-CM | POA: Diagnosis not present

## 2018-09-15 DIAGNOSIS — N2889 Other specified disorders of kidney and ureter: Secondary | ICD-10-CM | POA: Diagnosis not present

## 2018-09-15 DIAGNOSIS — H269 Unspecified cataract: Principal | ICD-10-CM

## 2018-09-15 DIAGNOSIS — I272 Pulmonary hypertension, unspecified: Principal | ICD-10-CM

## 2018-09-15 DIAGNOSIS — G4733 Obstructive sleep apnea (adult) (pediatric): Principal | ICD-10-CM

## 2018-09-15 DIAGNOSIS — Z940 Kidney transplant status: Principal | ICD-10-CM

## 2018-09-15 DIAGNOSIS — D473 Essential (hemorrhagic) thrombocythemia: Principal | ICD-10-CM

## 2018-09-15 DIAGNOSIS — T7840XA Allergy, unspecified, initial encounter: Principal | ICD-10-CM

## 2018-09-15 DIAGNOSIS — D509 Iron deficiency anemia, unspecified: Principal | ICD-10-CM

## 2018-09-15 DIAGNOSIS — Q2730 Arteriovenous malformation, site unspecified: Principal | ICD-10-CM

## 2018-09-15 DIAGNOSIS — S8980XA Other specified injuries of unspecified lower leg, initial encounter: Principal | ICD-10-CM

## 2018-09-15 DIAGNOSIS — N189 Chronic kidney disease, unspecified: Principal | ICD-10-CM

## 2018-09-15 DIAGNOSIS — D689 Coagulation defect, unspecified: Principal | ICD-10-CM

## 2018-09-15 DIAGNOSIS — M1990 Unspecified osteoarthritis, unspecified site: Principal | ICD-10-CM

## 2018-09-15 DIAGNOSIS — M2550 Pain in unspecified joint: Principal | ICD-10-CM

## 2018-09-15 DIAGNOSIS — I10 Essential (primary) hypertension: Principal | ICD-10-CM

## 2018-09-15 MED ORDER — EPLERENONE 25 MG TABLET
ORAL_TABLET | Freq: Every day | ORAL | 3 refills | 0.00000 days | Status: CP
Start: 2018-09-15 — End: 2019-01-03

## 2018-09-17 DIAGNOSIS — Z940 Kidney transplant status: Principal | ICD-10-CM

## 2018-09-18 ENCOUNTER — Encounter: Payer: Self-pay | Admitting: Internal Medicine

## 2018-09-19 NOTE — Telephone Encounter (Signed)
Have you seen a Cpap form for patient?

## 2018-09-20 NOTE — Telephone Encounter (Signed)
I have received the CPAP form but have to call the pt to confirm pressure setting. I will have this completed when you return tomorrow so you can sign

## 2018-09-21 DIAGNOSIS — D473 Essential (hemorrhagic) thrombocythemia: Principal | ICD-10-CM

## 2018-09-21 NOTE — Telephone Encounter (Signed)
Signed and placed in box.   

## 2018-09-24 MED ORDER — HYDROXYUREA 500 MG CAPSULE
ORAL_CAPSULE | 0 refills | 0 days | Status: CP
Start: 2018-09-24 — End: 2018-12-07

## 2018-11-01 ENCOUNTER — Encounter: Payer: Self-pay | Admitting: *Deleted

## 2018-11-13 MED ORDER — LOSARTAN 50 MG TABLET
ORAL_TABLET | 0 refills | 0 days | Status: CP
Start: 2018-11-13 — End: 2018-12-11

## 2018-11-21 ENCOUNTER — Encounter: Payer: Self-pay | Admitting: *Deleted

## 2018-12-07 MED ORDER — HYDROXYUREA 500 MG CAPSULE
ORAL_CAPSULE | 0 refills | 0 days | Status: CP
Start: 2018-12-07 — End: 2019-02-28

## 2018-12-08 ENCOUNTER — Ambulatory Visit: Admit: 2018-12-08 | Discharge: 2018-12-08 | Payer: MEDICARE

## 2018-12-08 ENCOUNTER — Other Ambulatory Visit: Admit: 2018-12-08 | Discharge: 2018-12-08 | Payer: MEDICARE

## 2018-12-08 DIAGNOSIS — Z940 Kidney transplant status: Secondary | ICD-10-CM | POA: Diagnosis not present

## 2018-12-08 DIAGNOSIS — Z79899 Other long term (current) drug therapy: Secondary | ICD-10-CM | POA: Diagnosis not present

## 2018-12-11 MED ORDER — LOSARTAN 50 MG TABLET
ORAL_TABLET | Freq: Every day | ORAL | 0 refills | 0 days
Start: 2018-12-11 — End: 2019-01-03

## 2018-12-12 ENCOUNTER — Telehealth: Admit: 2018-12-12 | Discharge: 2018-12-13 | Payer: MEDICARE | Attending: Nephrology | Primary: Nephrology

## 2018-12-12 DIAGNOSIS — Z940 Kidney transplant status: Secondary | ICD-10-CM | POA: Diagnosis not present

## 2018-12-12 DIAGNOSIS — I272 Pulmonary hypertension, unspecified: Secondary | ICD-10-CM | POA: Diagnosis not present

## 2018-12-12 DIAGNOSIS — Z48298 Encounter for aftercare following other organ transplant: Secondary | ICD-10-CM | POA: Diagnosis not present

## 2018-12-12 DIAGNOSIS — D899 Disorder involving the immune mechanism, unspecified: Secondary | ICD-10-CM | POA: Diagnosis not present

## 2018-12-12 DIAGNOSIS — Z7982 Long term (current) use of aspirin: Secondary | ICD-10-CM | POA: Diagnosis not present

## 2018-12-12 DIAGNOSIS — J449 Chronic obstructive pulmonary disease, unspecified: Secondary | ICD-10-CM | POA: Diagnosis not present

## 2018-12-12 DIAGNOSIS — I10 Essential (primary) hypertension: Secondary | ICD-10-CM | POA: Diagnosis not present

## 2018-12-12 DIAGNOSIS — D473 Essential (hemorrhagic) thrombocythemia: Secondary | ICD-10-CM | POA: Diagnosis not present

## 2018-12-12 DIAGNOSIS — Z79899 Other long term (current) drug therapy: Secondary | ICD-10-CM | POA: Diagnosis not present

## 2019-01-02 MED ORDER — PROGRAF 0.5 MG CAPSULE
ORAL_CAPSULE | Freq: Two times a day (BID) | ORAL | 3 refills | 0 days | Status: CP
Start: 2019-01-02 — End: 2019-01-03

## 2019-01-02 MED ORDER — MYFORTIC 180 MG TABLET,DELAYED RELEASE
ORAL_TABLET | Freq: Two times a day (BID) | ORAL | 3 refills | 0 days | Status: CP
Start: 2019-01-02 — End: 2019-01-03

## 2019-01-02 NOTE — Unmapped (Signed)
Per test claim for PROGRAF AND MYFORTIC at the [facility], patient needs Medication Assistance Program for Prior Authorization.

## 2019-01-03 MED ORDER — PRAVASTATIN 40 MG TABLET
ORAL_TABLET | Freq: Every day | ORAL | 11 refills | 0 days | Status: CP
Start: 2019-01-03 — End: 2019-12-29

## 2019-01-03 MED ORDER — SODIUM BICARBONATE 325 MG TABLET
ORAL_TABLET | Freq: Two times a day (BID) | ORAL | 3 refills | 0.00000 days | Status: CP
Start: 2019-01-03 — End: 2020-01-03

## 2019-01-03 MED ORDER — CARVEDILOL 25 MG TABLET
ORAL_TABLET | Freq: Two times a day (BID) | ORAL | 3 refills | 0.00000 days
Start: 2019-01-03 — End: ?

## 2019-01-03 MED ORDER — CEPHALEXIN 250 MG CAPSULE
ORAL_CAPSULE | Freq: Every day | ORAL | 3 refills | 0 days | Status: CP
Start: 2019-01-03 — End: ?

## 2019-01-03 MED ORDER — CHOLECALCIFEROL (VITAMIN D3) 25 MCG (1,000 UNIT) TABLET
ORAL_TABLET | Freq: Every day | ORAL | 5 refills | 0.00000 days
Start: 2019-01-03 — End: ?

## 2019-01-03 MED ORDER — AMLODIPINE 5 MG TABLET
ORAL_TABLET | Freq: Two times a day (BID) | ORAL | 3 refills | 0 days | Status: CP
Start: 2019-01-03 — End: ?

## 2019-01-03 MED ORDER — LOSARTAN 50 MG TABLET
ORAL_TABLET | Freq: Every day | ORAL | 0 refills | 0 days
Start: 2019-01-03 — End: 2019-02-28

## 2019-01-03 MED ORDER — EPLERENONE 25 MG TABLET
ORAL_TABLET | Freq: Every day | ORAL | 3 refills | 0.00000 days | Status: CP
Start: 2019-01-03 — End: ?

## 2019-01-03 MED ORDER — PROGRAF 0.5 MG CAPSULE
ORAL_CAPSULE | Freq: Two times a day (BID) | ORAL | 3 refills | 0 days | Status: CP
Start: 2019-01-03 — End: ?

## 2019-01-03 MED ORDER — FUROSEMIDE 40 MG TABLET
ORAL_TABLET | Freq: Every day | ORAL | 11 refills | 0 days | Status: CP
Start: 2019-01-03 — End: 2020-01-03

## 2019-01-03 MED ORDER — MYFORTIC 180 MG TABLET,DELAYED RELEASE
ORAL_TABLET | Freq: Two times a day (BID) | ORAL | 3 refills | 0 days | Status: CP
Start: 2019-01-03 — End: ?

## 2019-01-03 MED ORDER — PANTOPRAZOLE 40 MG TABLET,DELAYED RELEASE
ORAL_TABLET | Freq: Every day | ORAL | 3 refills | 0 days | Status: CP
Start: 2019-01-03 — End: 2020-01-03

## 2019-01-03 NOTE — Unmapped (Signed)
6/24 update: patient checked and called back - she states all medications are to be sent to [facility]. She has not had any problems with obtaining her medications or cost at [facility], and requested that we reach out to clinic to make sure rxs are sent there. Disenrolling from our specialty calls at this time.-ef      [facility]   Patient Onboarding/Medication Counseling    Erin Welch is a 68 y.o. female with kidney transplant who I am counseling today on continuation of therapy.  I am speaking to the patient.    Verified patient's date of birth / HIPAA.    Specialty medication(s) to be sent: none      Non-specialty medications/supplies to be sent: none      Medications not needed at this time: none         Prograf (tacrolimus)    Medication & Administration     Dosage: take 2 of the 0.5mg  capsules (1mg  total) by mouth twice daily     Administration:   ? May take with or without food  ? Take 12 hours apart    Adherence/Missed dose instructions:  ? Take a missed dose as soon as you think about it.  ? If it is close to the time for your next dose, skip the missed dose and go back to your normal time.  ? Do not take 2 doses at the same time or extra doses.    Goals of Therapy     ? To prevent organ rejection    Side Effects & Monitoring Parameters     ? Common side effects  ? Dizziness  ? Fatigue  ? Headache  ? Stuffy nose or sore throat  ? Nausea, vomiting, stomach pain, diarrhea, constipation  ? Heartburn  ? Back or joint pain  ? Increased risk of infection    ? The following side effects should be reported to the provider:  ? Allergic reaction  ? Kidney issues (change in quantity or urine passed, blood in urine, or weight gain)  ? High blood pressure (dizziness, change in eyesight, headache)  ? Electrolyte issues (change in mood, confusion, muscle pain, or weakness)  ? Abnormal breathing  ? Shakiness  ? Unexplained bleeding or bruising (gums bleeding, blood in urine, nosebleeds, any abnormal bleeding)  ? Signs of infection  ? Skin changes (sores, paleness, new or changed bumps or moles)    ? Monitoring Parameters  ? Renal function  ? Liver function  ? Glucose levels  ? Blood pressure  ? Tacrolimus trough levels  ? Cardiac monitoring (for QT prolongation)      Contraindications, Warnings, & Precautions     ? Black Box Warning: Infections - immunosuppressant agents increase the risk of infection that may lead to hospitalization or death  ? Black Box Warning: Malignancy - immunosuppressant agents may be associated with the development of malignancies that may lead to hospitalization or death  ? Limit or avoid sun and ultraviolet light exposure, use appropriate sun protection  ? Myocardial hypertrophy -avoid use in patients with congenital long QT syndrome  ? Diabetes mellitus - the risk for new-onset diabetes and insulin-dependent post-transplant diabetes mellitus is increased with tacrolimus use after transplantation  ? GI perforation  ? Hyperkalemia  ? Hypertension  ? Nephrotoxicity  ? Neurotoxicity  ? This is a narrow therapeutic index drug. Do not switch manufacturers without first talking to the provider.    Drug/Food Interactions     ?  Medication list reviewed in Epic. The patient was instructed to inform the care team before taking any new medications or supplements. no interactions noted that clinic is not already monitoring.   ? Avoid alcohol  ? Avoid grapefruit or grapefruit juice  ? Avoid live vaccines    Storage, Handling Precautions, & Disposal     ? Store at room temperature  ? Keep away from children and pets      Current Medications (including OTC/herbals), Comorbidities and Allergies     Current Outpatient Medications   Medication Sig Dispense Refill   ??? acetaminophen (TYLENOL) 325 MG tablet Take 650 mg by mouth.      ??? amLODIPine (NORVASC) 5 MG tablet Take 1 tablet (5 mg total) by mouth two (2) times a day. 180 tablet 3   ??? aspirin 81 MG chewable tablet Chew 81 mg daily.     ??? azelastine (ASTELIN) 137 mcg (0.1 %) nasal spray 2 sprays by Each Nare route Two (2) times a day. (Patient taking differently: 2 sprays by Each Nare route two (2) times a day as needed. ) 30 mL 2   ??? carvediloL (COREG) 25 MG tablet Take 1 tablet (25 mg total) by mouth Two (2) times a day. 180 tablet 3   ??? cephalexin (KEFLEX) 250 MG capsule Take 1 capsule (250 mg total) by mouth daily. 90 capsule 3   ??? cholecalciferol, vitamin D3, 1,000 unit (25 mcg) tablet Take 1 tablet (1,000 Units total) by mouth daily. 60 tablet 5   ??? eplerenone (INSPRA) 25 MG tablet Take 1 tablet (25 mg total) by mouth daily. 90 tablet 3   ??? ferrous sulfate 325 (65 FE) MG tablet Take 325 mg by mouth daily.     ??? fluticasone propionate (FLONASE) 50 mcg/actuation nasal spray 1 spray by Each Nare route two (2) times a day as needed. 1 Bottle 11   ??? furosemide (LASIX) 40 MG tablet Take 1 tablet (40 mg total) by mouth daily. 30 tablet 11   ??? glucosamine-chondroitin 500-400 mg tablet Take 1 tablet by mouth Two (2) times a day.     ??? hydroxyurea (HYDREA) 500 mg capsule TAKE 3 CAPSULES BY MOUTH ONCE DAILY 270 capsule 0   ??? losartan (COZAAR) 50 MG tablet Take 1 tablet (50 mg total) by mouth daily. 180 tablet 0   ??? MYFORTIC 180 mg EC tablet Take 2 tablets (360 mg total) by mouth Two (2) times a day. 360 tablet 3   ??? OMEGA-3/DHA/EPA/FISH OIL (FISH OIL-OMEGA-3 FATTY ACIDS) 300-1,000 mg capsule Take 1 g by mouth.      ??? pantoprazole (PROTONIX) 40 MG tablet Take 1 tablet (40 mg total) by mouth daily. 90 tablet 3   ??? pravastatin (PRAVACHOL) 40 MG tablet Take 1 tablet (40 mg total) by mouth daily. 30 tablet 11   ??? PROGRAF 0.5 mg capsule Take 2 capsules (1 mg total) by mouth two (2) times a day. 360 capsule 3   ??? sodium bicarbonate 325 MG tablet Take 2 tablets (650 mg total) by mouth Two (2) times a day. 360 tablet 3   ??? zolpidem (AMBIEN) 5 MG tablet Take 1 tablet (5 mg total) by mouth nightly as needed. 30 tablet 3     No current facility-administered medications for this visit.        Allergies   Allergen Reactions   ??? Nitrofurantoin Nausea And Vomiting       Patient Active Problem List   Diagnosis   ???  Other specified disorders of mineral metabolism   ??? Contracture of hand joint   ??? Heart failure with preserved left ventricular function (HFpEF) (CMS-HCC)   ??? Essential thrombocytosis (CMS-HCC)   ??? Anemia of chronic kidney failure   ??? Aftercare following organ transplant   ??? Hypertension   ??? Cough   ??? Post-traumatic osteoarthritis of knee   ??? Pulmonary hypertension (CMS-HCC)   ??? Renal transplant, status post   ??? GERD (gastroesophageal reflux disease)   ??? Immunosuppressed status (CMS-HCC)       Reviewed and up to date in Epic.    Appropriateness of Therapy     Is medication and dose appropriate based on diagnosis? Yes    Baseline Quality of Life Assessment      How many days over the past month did your transplant keep you from your normal activities? 0    Financial Information     Medication Assistance provided: None Required    Anticipated copay of $12.13 for brand name prograf reviewed with patient. Verified delivery address.    Delivery Information     Scheduled delivery date: n/a    Expected start date: patient is currently taking    Medication will be delivered via n/a to the n/a address in Epic [place].  This shipment will require a signature.      Explained the services we provide at [facility] and that each month we would call to set up refills.  Stressed importance of returning phone calls so that we could ensure they receive their medications in time each month.  Informed patient that we should be setting up refills 7-10 days prior to when they will run out of medication.  A pharmacist will reach out to perform a clinical assessment periodically.  Informed patient that a welcome packet and a drug information handout will be sent.      Patient verbalized understanding of the above information as well as how to contact the pharmacy at [phone] option 4 with any questions/concerns.  The pharmacy is open Monday through Friday 8:30am-4:30pm.  A pharmacist is available 24/7 via pager to answer any clinical questions they may have.    Patient Specific Needs     ? Does the patient have any physical, cognitive, or cultural barriers? No    ? Patient prefers to have medications discussed with  Patient     ? Is the patient able to read and understand education materials at a high school level or above? Yes    ? Patient's primary language is  English     ? Is the patient high risk? Yes, patient taking a REMS drug     ? Does the patient require a Care Management Plan? No     ? Does the patient require physician intervention or other additional services (i.e. nutrition, smoking cessation, social work)? No      Thad Ranger  [facility] Specialty Pharmacist

## 2019-01-03 NOTE — Unmapped (Signed)
[facility] Specialty Medication Referral: PA Approved      Medication (Brand/Generic): PROGRAF 0.5MG  CAPSULES (WRITTEN DAW1)    Final Test Claim completed with resulted information below:    Patient ABLE to fill at [facility]  Insurance Company:  MEDICARE PART B  Anticipated Copay: $12.13  Is anticipated copay with a copay card or grant? No, there is no need for grant or copay assistance.     Does this patient have to receive a partial fill of the medication due to insurance restrictions? NO  If so, please cofirm how many days supply is allowed per plan per fill and how long the patient will have to fill partial months supply for the medication: NOT APPLICABLE     If the copay is under the $25 defined limit, per policy there will be no further investigation of need for financial assistance at this time unless patient requests. This referral has been communicated to the provider and handed off to the [facility] team for further processing and filling of prescribed medication.   ______________________________________________________________________  Please utilize this referral for viewing purposes as it will serve as the central location for all relevant documentation and updates.

## 2019-01-26 ENCOUNTER — Ambulatory Visit: Admit: 2019-01-26 | Discharge: 2019-01-26 | Payer: MEDICARE

## 2019-01-26 DIAGNOSIS — I110 Hypertensive heart disease with heart failure: Secondary | ICD-10-CM | POA: Diagnosis not present

## 2019-01-26 DIAGNOSIS — Z940 Kidney transplant status: Secondary | ICD-10-CM | POA: Diagnosis not present

## 2019-01-26 DIAGNOSIS — I5030 Unspecified diastolic (congestive) heart failure: Secondary | ICD-10-CM | POA: Diagnosis not present

## 2019-01-26 DIAGNOSIS — Z6837 Body mass index (BMI) 37.0-37.9, adult: Secondary | ICD-10-CM | POA: Diagnosis not present

## 2019-01-26 DIAGNOSIS — Z888 Allergy status to other drugs, medicaments and biological substances status: Secondary | ICD-10-CM | POA: Diagnosis not present

## 2019-01-26 DIAGNOSIS — Z79899 Other long term (current) drug therapy: Secondary | ICD-10-CM | POA: Diagnosis not present

## 2019-01-26 DIAGNOSIS — I10 Essential (primary) hypertension: Secondary | ICD-10-CM | POA: Diagnosis not present

## 2019-01-26 DIAGNOSIS — Z886 Allergy status to analgesic agent status: Secondary | ICD-10-CM | POA: Diagnosis not present

## 2019-02-28 MED ORDER — LOSARTAN 50 MG TABLET
ORAL_TABLET | 0 refills | 0 days | Status: CP
Start: 2019-02-28 — End: ?

## 2019-02-28 MED ORDER — HYDROXYUREA 500 MG CAPSULE
ORAL_CAPSULE | 0 refills | 0 days | Status: CP
Start: 2019-02-28 — End: ?

## 2019-03-15 DIAGNOSIS — Z23 Encounter for immunization: Secondary | ICD-10-CM | POA: Diagnosis not present

## 2019-03-26 ENCOUNTER — Ambulatory Visit: Admit: 2019-03-26 | Discharge: 2019-03-27 | Payer: MEDICARE

## 2019-03-26 DIAGNOSIS — Z79899 Other long term (current) drug therapy: Secondary | ICD-10-CM | POA: Diagnosis not present

## 2019-03-26 DIAGNOSIS — Z940 Kidney transplant status: Secondary | ICD-10-CM | POA: Diagnosis not present

## 2019-03-27 ENCOUNTER — Telehealth: Admit: 2019-03-27 | Discharge: 2019-03-28 | Payer: MEDICARE

## 2019-03-27 DIAGNOSIS — D473 Essential (hemorrhagic) thrombocythemia: Secondary | ICD-10-CM | POA: Diagnosis not present

## 2019-03-28 MED ORDER — DARBEPOETIN ALFA 40 MCG/0.4 ML IN POLYSORBATE INJECTION SYRINGE
SUBCUTANEOUS | 2 refills | 30.00000 days | Status: CP
Start: 2019-03-28 — End: ?

## 2019-03-29 ENCOUNTER — Ambulatory Visit: Admit: 2019-03-29 | Discharge: 2019-03-30 | Payer: MEDICARE | Attending: Dermatology | Primary: Dermatology

## 2019-03-29 DIAGNOSIS — D489 Neoplasm of uncertain behavior, unspecified: Secondary | ICD-10-CM | POA: Diagnosis not present

## 2019-03-29 DIAGNOSIS — L570 Actinic keratosis: Secondary | ICD-10-CM | POA: Diagnosis not present

## 2019-03-29 DIAGNOSIS — D040 Carcinoma in situ of skin of lip: Secondary | ICD-10-CM | POA: Diagnosis not present

## 2019-03-29 DIAGNOSIS — L578 Other skin changes due to chronic exposure to nonionizing radiation: Secondary | ICD-10-CM | POA: Diagnosis not present

## 2019-03-29 MED ORDER — FLUOROURACIL 5 % TOPICAL CREAM
0 refills | 0.00000 days | Status: CP
Start: 2019-03-29 — End: ?

## 2019-04-04 DIAGNOSIS — C4492 Squamous cell carcinoma of skin, unspecified: Secondary | ICD-10-CM

## 2019-04-04 NOTE — Unmapped (Signed)
[facility] Specialty Medication Onboarding    Specialty Medication: ARANESP  Prior Authorization: Approved   Financial Assistance: No - patient doesn't qualify for additional assistance   Final Copay/Day Supply: $195.71 / 30 DAYS    Insurance Restrictions: None     Notes to Pharmacist:     The triage team has completed the benefits investigation and has determined that the patient is able to fill this medication at [facility]. Please contact the patient to complete the onboarding or follow up with the prescribing physician as needed.

## 2019-04-04 NOTE — Unmapped (Signed)
Patient notified of biopsy results. Recommend Mohs given location and immunosuppression. Discussed potential option of efudex as well if Mohs is not recommended by surgeons.    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 [FORMATTING REMOVED]  ----------      Referral submitted.  Tracking yes

## 2019-04-05 NOTE — Unmapped (Signed)
The patient declined counseling on medication administration, missed dose instructions, goals of therapy, side effects and monitoring parameters, warnings and precautions, drug/food interactions and storage, handling precautions, and disposal because They were counseld in clinic and would like prescription to be sent to [facility]. The information in the declined sections below are for informational purposes only and was not discussed with patient.     [facility]   Patient Onboarding/Medication Counseling    [name] is a 68 y.o. female with anemia of chronic renal failure who I am counseling today on initiation of therapy.  I am speaking to the patient.    Verified patient's date of birth / HIPAA.    Specialty medication(s) to be sent: Hematology/Oncology: Aranesp      Non-specialty medications/supplies to be sent: none      Medications not needed at this time: none       Aranesp (Darbepoetin Alfa)    Medication & Administration     Dosage:   ? Inject the contents of 1 syringe (40 mcg total) under the skin every thirty (30) days.    Administration:   ? Remove one prefilled syringe from refrigerator and take remove syringe from the tray by holding the yellow safety guard  o Check for damage, expiration date, color (clear and colorless with no flakes/particles)  ? Gather injection materials (Syringe, alcohol swabs, cotton balls, band aid, sharps container) and then wash hands with soap and warm water  ? Prepare and clean injection site (thigh or stomach, can use buttocks or upper arm if someone else is administering the injection) with alcohol swab and let air dry  ? Hold the syringe by the syringe barrel and pull the gray needle cap straight off and away from the body and throw it away in the sharps container  ? Check for dose quantity (if full syringe then proceed to next step) if partial dose  o Point needle up and tap gently until air rises to top  o Slowly push plunger rod up to the line on the syringe barrel that matches dose prescribed  ? Pinch skin at injection site to create a firm surface  ? Hold pinched skin and insert the needle into the skin at a 45 or 90 degree angle  ? Slowly push the plunger rod until it reaches the bottom  ? Pull needle out of your skin and pull the yellow safety guard until it clicks and covers the needle  ? Throw away the used syringe in the sharps container    Adherence/Missed dose instructions:  ? Call your provider    Goals of Therapy   ? To treat anemia    Side Effects & Monitoring Parameters   ? Common side effects:  ? Stomach pain  ? Cough    ? The following side effects should be reported to the provider:  ? Allergic reaction  ? High blood pressure (bad headache, dizziness, change in eyesight)  ? Fast heartbeat   ? Shortness of breath, big weight gain, or swelling in arms/legs  ? Weakness on 1 side of the body (trouble speaking, thinking, balancing)  ? Cool or pale arm or leg  ? Dizziness, passing out, sweating a lot  ? Seizures  ? Signs of red, swollen, blistered, or peeling skin (with or without fever), red or irritated eyes, or sores in your mouth, throat, nose, or eyes    ? Monitoring Parameters:  ? Blood work (CBC)   ? Blood  pressure    Contraindications, Warnings, & Precautions   ? Black box warning: May raise chance of heart attack, stroke, blood clots, and death.  ? People with some types of cancer have died sooner when using this drug. This drug also raised the chance of tumor growth and the tumor happening again in these people.    Drug/Food Interactions     ? Medication list reviewed in Epic. The patient was instructed to inform the care team before taking any new medications or supplements. No drug interactions identified.   ? List any food/vaccine restrictions here.     Storage, Handling Precautions, & Disposal   ? Store in refrigerator in original packaging  ? Keep away from children and pets        Current Medications (including OTC/herbals), Comorbidities and Allergies     Current Outpatient Medications   Medication Sig Dispense Refill   ??? acetaminophen (TYLENOL) 325 MG tablet Take 650 mg by mouth.      ??? amLODIPine (NORVASC) 5 MG tablet Take 1 tablet (5 mg total) by mouth two (2) times a day. 180 tablet 3   ??? aspirin 81 MG chewable tablet Chew 81 mg daily.     ??? azelastine (ASTELIN) 137 mcg (0.1 %) nasal spray 2 sprays by Each Nare route Two (2) times a day. (Patient taking differently: 2 sprays by Each Nare route two (2) times a day as needed. ) 30 mL 2   ??? carvediloL (COREG) 25 MG tablet Take 1 tablet (25 mg total) by mouth Two (2) times a day. 180 tablet 3   ??? cephalexin (KEFLEX) 250 MG capsule Take 1 capsule (250 mg total) by mouth daily. 90 capsule 3   ??? cholecalciferol, vitamin D3, 1,000 unit (25 mcg) tablet Take 1 tablet (1,000 Units total) by mouth daily. 60 tablet 5   ??? darbepoetin alfa-polysorbate (ARANESP) 40 mcg/0.4 mL Syrg Inject the contents of 1 syringe (40 mcg total) under the skin every thirty (30) days. 0.4 mL 2   ??? eplerenone (INSPRA) 25 MG tablet Take 1 tablet (25 mg total) by mouth daily. 90 tablet 3   ??? ferrous sulfate 325 (65 FE) MG tablet Take 325 mg by mouth daily.     ??? fluorouraciL (EFUDEX) 5 % cream Apply to your whole nose daily for 3 weeks. 40 g 0   ??? fluticasone propionate (FLONASE) 50 mcg/actuation nasal spray 1 spray by Each Nare route two (2) times a day as needed. 1 Bottle 11   ??? FLUZONE HIGHDOSE QUAD 20-21 PF 240 mcg/0.7 mL Syrg PHARMACIST ADMINISTERED IMMUNIZATION ADMINISTERED AT TIME OF DISPENSING     ??? furosemide (LASIX) 40 MG tablet Take 1 tablet (40 mg total) by mouth daily. 30 tablet 11   ??? glucosamine-chondroitin 500-400 mg tablet Take 1 tablet by mouth Two (2) times a day.     ??? hydroxyurea (HYDREA) 500 mg capsule TAKE 3 CAPSULES BY MOUTH ONCE DAILY 270 capsule 0   ??? losartan (COZAAR) 50 MG tablet Take 1 tablet by mouth twice daily 180 tablet 0   ??? MYFORTIC 180 mg EC tablet Take 2 tablets (360 mg total) by mouth Two (2) times a day. 360 tablet 3   ??? OMEGA-3/DHA/EPA/FISH OIL (FISH OIL-OMEGA-3 FATTY ACIDS) 300-1,000 mg capsule Take 1 g by mouth.      ??? pantoprazole (PROTONIX) 40 MG tablet Take 1 tablet (40 mg total) by mouth daily. 90 tablet 3   ??? pravastatin (PRAVACHOL) 40 MG tablet Take 1  tablet (40 mg total) by mouth daily. 30 tablet 11   ??? PROGRAF 0.5 mg capsule Take 2 capsules (1 mg total) by mouth two (2) times a day. 360 capsule 3   ??? sodium bicarbonate 325 MG tablet Take 2 tablets (650 mg total) by mouth Two (2) times a day. 360 tablet 3   ??? zolpidem (AMBIEN) 5 MG tablet Take 1 tablet (5 mg total) by mouth nightly as needed. 30 tablet 3     No current facility-administered medications for this visit.        Allergies   Allergen Reactions   ??? Nitrofurantoin Nausea And Vomiting       Patient Active Problem List   Diagnosis   ??? Other specified disorders of mineral metabolism   ??? Contracture of hand joint   ??? Heart failure with preserved left ventricular function (HFpEF) (CMS-HCC)   ??? Essential thrombocytosis (CMS-HCC)   ??? Anemia of chronic kidney failure   ??? Aftercare following organ transplant   ??? Hypertension   ??? Cough   ??? Post-traumatic osteoarthritis of knee   ??? Pulmonary hypertension (CMS-HCC)   ??? Renal transplant, status post   ??? GERD (gastroesophageal reflux disease)   ??? Immunosuppressed status (CMS-HCC)       Reviewed and up to date in Epic.    Appropriateness of Therapy     Is medication and dose appropriate based on diagnosis? Yes    Baseline Quality of Life Assessment      How many days over the past month did your anemia keep you from your normal activities? declined    Financial Information     Medication Assistance provided: Prior Authorization    Anticipated copay of $195.71 reviewed with patient. Verified delivery address.    Delivery Information     Scheduled delivery date: NA    Expected start date: NA    Medication will be delivered via NA to the NA address in Epic [place].  This shipment NA require a signature.      Explained the services we provide at [facility] and that each month we would call to set up refills.  Stressed importance of returning phone calls so that we could ensure they receive their medications in time each month.  Informed patient that we should be setting up refills 7-10 days prior to when they will run out of medication.  A pharmacist will reach out to perform a clinical assessment periodically.  Informed patient that a welcome packet and a drug information handout will be sent.      Patient verbalized understanding of the above information as well as how to contact the pharmacy at [phone] option [phone] with any questions/concerns.  The pharmacy is open Monday through Friday 8:30am-4:30pm.  A pharmacist is available 24/7 via pager to answer any clinical questions they may have.    Patient Specific Needs     ? Does the patient have any physical, cognitive, or cultural barriers? No    ? Patient prefers to have medications discussed with  Patient     ? Is the patient able to read and understand education materials at a high school level or above? Yes    ? Patient's primary language is  English     ? Is the patient high risk? No     ? Does the patient require a Care Management Plan? No     ? Does the patient require physician intervention or other additional services (i.e.  nutrition, smoking cessation, social work)? No      Tylena Prisk [facility] Specialty Pharmacist

## 2019-04-05 NOTE — Unmapped (Signed)
This patient has been disenrolled from the [facility] specialty pharmacy services due to a pharmacy change. The patient is now filling at [facility] [place].    Kermit Balo  [facility] Specialty Pharmacist

## 2019-04-27 MED ORDER — DARBEPOETIN ALFA 40 MCG/0.4 ML IN POLYSORBATE INJECTION SYRINGE: 40 ug | mL | 2 refills | 30 days | Status: AC

## 2019-04-30 DIAGNOSIS — N184 Chronic kidney disease, stage 4 (severe): Principal | ICD-10-CM

## 2019-04-30 DIAGNOSIS — D631 Anemia in chronic kidney disease: Principal | ICD-10-CM

## 2019-04-30 MED ORDER — DARBEPOETIN ALFA 40 MCG/ML IN POLYSORBATE INJECTION: 40 ug | mL | 3 refills | 30 days | Status: AC

## 2019-05-01 ENCOUNTER — Ambulatory Visit: Admit: 2019-05-01 | Discharge: 2019-05-02 | Payer: MEDICARE

## 2019-05-01 DIAGNOSIS — Z940 Kidney transplant status: Secondary | ICD-10-CM | POA: Diagnosis not present

## 2019-05-01 DIAGNOSIS — D473 Essential (hemorrhagic) thrombocythemia: Secondary | ICD-10-CM | POA: Diagnosis not present

## 2019-05-01 DIAGNOSIS — Z79899 Other long term (current) drug therapy: Secondary | ICD-10-CM | POA: Diagnosis not present

## 2019-05-01 DIAGNOSIS — D899 Disorder involving the immune mechanism, unspecified: Principal | ICD-10-CM

## 2019-05-01 MED ORDER — MYFORTIC 180 MG TABLET,DELAYED RELEASE: 360 mg | tablet | Freq: Two times a day (BID) | 11 refills | 30 days | Status: AC

## 2019-05-02 DIAGNOSIS — D899 Disorder involving the immune mechanism, unspecified: Principal | ICD-10-CM

## 2019-05-02 DIAGNOSIS — Z940 Kidney transplant status: Principal | ICD-10-CM

## 2019-05-02 MED ORDER — PROGRAF 0.5 MG CAPSULE: 1 mg | capsule | Freq: Two times a day (BID) | 11 refills | 30 days | Status: AC

## 2019-05-17 ENCOUNTER — Ambulatory Visit
Admit: 2019-05-17 | Discharge: 2019-05-18 | Payer: MEDICARE | Attending: MOHS-Micrographic Surgery | Primary: MOHS-Micrographic Surgery

## 2019-05-17 DIAGNOSIS — L578 Other skin changes due to chronic exposure to nonionizing radiation: Secondary | ICD-10-CM | POA: Diagnosis not present

## 2019-05-17 DIAGNOSIS — Z85828 Personal history of other malignant neoplasm of skin: Secondary | ICD-10-CM | POA: Diagnosis not present

## 2019-05-17 DIAGNOSIS — L570 Actinic keratosis: Secondary | ICD-10-CM | POA: Diagnosis not present

## 2019-05-17 DIAGNOSIS — L814 Other melanin hyperpigmentation: Secondary | ICD-10-CM | POA: Diagnosis not present

## 2019-05-17 DIAGNOSIS — D040 Carcinoma in situ of skin of lip: Secondary | ICD-10-CM | POA: Diagnosis not present

## 2019-05-28 DIAGNOSIS — D473 Essential (hemorrhagic) thrombocythemia: Principal | ICD-10-CM

## 2019-05-28 MED ORDER — CARVEDILOL 25 MG TABLET
ORAL_TABLET | 3 refills | 0 days | Status: CP
Start: 2019-05-28 — End: ?

## 2019-05-31 DIAGNOSIS — D473 Essential (hemorrhagic) thrombocythemia: Principal | ICD-10-CM

## 2019-06-01 DIAGNOSIS — D473 Essential (hemorrhagic) thrombocythemia: Principal | ICD-10-CM

## 2019-06-01 MED ORDER — HYDROXYUREA 500 MG CAPSULE
ORAL_CAPSULE | Freq: Every day | ORAL | 3 refills | 90 days | Status: CP
Start: 2019-06-01 — End: ?

## 2019-06-01 MED ORDER — LOSARTAN 50 MG TABLET
ORAL_TABLET | Freq: Two times a day (BID) | ORAL | 3 refills | 90.00000 days | Status: CP
Start: 2019-06-01 — End: ?

## 2019-06-04 DIAGNOSIS — Z940 Kidney transplant status: Principal | ICD-10-CM

## 2019-06-04 DIAGNOSIS — Z79899 Other long term (current) drug therapy: Principal | ICD-10-CM

## 2019-06-06 ENCOUNTER — Other Ambulatory Visit: Payer: Self-pay

## 2019-06-19 ENCOUNTER — Ambulatory Visit: Admit: 2019-06-19 | Discharge: 2019-06-20 | Payer: MEDICARE

## 2019-06-19 DIAGNOSIS — Z4822 Encounter for aftercare following kidney transplant: Secondary | ICD-10-CM | POA: Diagnosis not present

## 2019-06-19 DIAGNOSIS — D473 Essential (hemorrhagic) thrombocythemia: Secondary | ICD-10-CM | POA: Diagnosis not present

## 2019-06-19 DIAGNOSIS — Z79899 Other long term (current) drug therapy: Secondary | ICD-10-CM | POA: Diagnosis not present

## 2019-06-19 DIAGNOSIS — Z940 Kidney transplant status: Principal | ICD-10-CM

## 2019-06-20 DIAGNOSIS — N2889 Other specified disorders of kidney and ureter: Principal | ICD-10-CM

## 2019-06-20 DIAGNOSIS — I151 Hypertension secondary to other renal disorders: Principal | ICD-10-CM

## 2019-06-20 DIAGNOSIS — I5030 Unspecified diastolic (congestive) heart failure: Principal | ICD-10-CM

## 2019-06-20 DIAGNOSIS — Z940 Kidney transplant status: Principal | ICD-10-CM

## 2019-06-20 MED ORDER — CEPHALEXIN 250 MG CAPSULE
ORAL_CAPSULE | Freq: Every day | ORAL | 3 refills | 90.00000 days | Status: CP
Start: 2019-06-20 — End: ?

## 2019-06-20 MED ORDER — AMLODIPINE 5 MG TABLET
ORAL_TABLET | Freq: Every day | ORAL | 3 refills | 90 days | Status: CP
Start: 2019-06-20 — End: ?

## 2019-06-20 MED ORDER — PANTOPRAZOLE 40 MG TABLET,DELAYED RELEASE
ORAL_TABLET | Freq: Every day | ORAL | 3 refills | 90.00000 days | Status: CP
Start: 2019-06-20 — End: 2020-06-19

## 2019-06-20 MED ORDER — EPLERENONE 25 MG TABLET
ORAL_TABLET | Freq: Every day | ORAL | 3 refills | 90.00000 days | Status: CP
Start: 2019-06-20 — End: ?

## 2019-06-21 ENCOUNTER — Telehealth: Admit: 2019-06-21 | Discharge: 2019-06-21 | Payer: MEDICARE | Attending: Nephrology | Primary: Nephrology

## 2019-06-21 ENCOUNTER — Ambulatory Visit: Admit: 2019-06-21 | Discharge: 2019-06-21 | Payer: MEDICARE

## 2019-06-21 DIAGNOSIS — E1129 Type 2 diabetes mellitus with other diabetic kidney complication: Secondary | ICD-10-CM | POA: Diagnosis not present

## 2019-06-21 DIAGNOSIS — D473 Essential (hemorrhagic) thrombocythemia: Secondary | ICD-10-CM | POA: Diagnosis not present

## 2019-06-21 DIAGNOSIS — D849 Immunodeficiency, unspecified: Secondary | ICD-10-CM | POA: Diagnosis not present

## 2019-06-21 DIAGNOSIS — Z48298 Encounter for aftercare following other organ transplant: Secondary | ICD-10-CM | POA: Diagnosis not present

## 2019-06-21 DIAGNOSIS — Z794 Long term (current) use of insulin: Secondary | ICD-10-CM | POA: Diagnosis not present

## 2019-06-21 DIAGNOSIS — Z79899 Other long term (current) drug therapy: Principal | ICD-10-CM

## 2019-06-21 DIAGNOSIS — Z940 Kidney transplant status: Principal | ICD-10-CM

## 2019-06-21 DIAGNOSIS — D899 Disorder involving the immune mechanism, unspecified: Principal | ICD-10-CM

## 2019-06-22 ENCOUNTER — Ambulatory Visit: Admit: 2019-06-22 | Discharge: 2019-06-23 | Attending: Nurse Practitioner | Primary: Nurse Practitioner

## 2019-06-22 DIAGNOSIS — Z20828 Contact with and (suspected) exposure to other viral communicable diseases: Secondary | ICD-10-CM | POA: Diagnosis not present

## 2019-07-05 DIAGNOSIS — Z940 Kidney transplant status: Principal | ICD-10-CM

## 2019-07-05 MED ORDER — AZITHROMYCIN 250 MG TABLET
ORAL_TABLET | 0 refills | 5 days | Status: CP
Start: 2019-07-05 — End: 2019-07-10

## 2019-07-09 DIAGNOSIS — Z940 Kidney transplant status: Principal | ICD-10-CM

## 2019-07-09 DIAGNOSIS — Z79899 Other long term (current) drug therapy: Principal | ICD-10-CM

## 2019-07-09 DIAGNOSIS — R0600 Dyspnea, unspecified: Principal | ICD-10-CM

## 2019-07-10 ENCOUNTER — Ambulatory Visit
Admit: 2019-07-10 | Discharge: 2019-07-11 | Payer: MEDICARE | Attending: Nurse Practitioner | Primary: Nurse Practitioner

## 2019-07-10 ENCOUNTER — Ambulatory Visit: Admit: 2019-07-10 | Discharge: 2019-07-11 | Payer: MEDICARE

## 2019-07-10 DIAGNOSIS — U071 COVID-19 in immunocompromised patient (CMS-HCC): Principal | ICD-10-CM

## 2019-07-10 DIAGNOSIS — D849 Immunodeficiency, unspecified: Principal | ICD-10-CM

## 2019-07-10 MED ORDER — ALBUTEROL SULFATE HFA 90 MCG/ACTUATION AEROSOL INHALER
Freq: Four times a day (QID) | RESPIRATORY_TRACT | 0 refills | 0 days | Status: CP | PRN
Start: 2019-07-10 — End: 2020-07-09

## 2019-07-10 MED ORDER — AEROCHAMBER MV SPACER
0 refills | 1.00000 days | Status: CP | PRN
Start: 2019-07-10 — End: ?

## 2019-07-10 MED ORDER — PREDNISONE 10 MG TABLET
ORAL_TABLET | Freq: Every day | ORAL | 0 refills | 20 days | Status: CP
Start: 2019-07-10 — End: ?

## 2019-07-10 MED ORDER — CEFUROXIME AXETIL 500 MG TABLET
ORAL_TABLET | Freq: Two times a day (BID) | ORAL | 0 refills | 10 days | Status: CP
Start: 2019-07-10 — End: ?

## 2019-07-11 DIAGNOSIS — U071 COVID-19: Principal | ICD-10-CM

## 2019-07-11 DIAGNOSIS — J988 Other specified respiratory disorders: Principal | ICD-10-CM

## 2019-07-12 ENCOUNTER — Ambulatory Visit: Admit: 2019-07-12 | Discharge: 2019-07-13 | Payer: MEDICARE

## 2019-07-12 DIAGNOSIS — Z23 Encounter for immunization: Secondary | ICD-10-CM | POA: Diagnosis not present

## 2019-07-12 DIAGNOSIS — U071 COVID-19: Secondary | ICD-10-CM | POA: Diagnosis not present

## 2019-07-12 DIAGNOSIS — J988 Other specified respiratory disorders: Secondary | ICD-10-CM

## 2019-07-13 MED ORDER — MYFORTIC 180 MG TABLET,DELAYED RELEASE
ORAL_TABLET | 11 refills | 0 days
Start: 2019-07-13 — End: ?

## 2019-07-16 DIAGNOSIS — D899 Disorder involving the immune mechanism, unspecified: Principal | ICD-10-CM

## 2019-07-16 DIAGNOSIS — Z940 Kidney transplant status: Principal | ICD-10-CM

## 2019-08-01 DIAGNOSIS — D849 Immunodeficiency, unspecified: Secondary | ICD-10-CM | POA: Diagnosis not present

## 2019-08-01 DIAGNOSIS — J189 Pneumonia, unspecified organism: Secondary | ICD-10-CM | POA: Diagnosis not present

## 2019-08-01 DIAGNOSIS — U071 COVID-19: Secondary | ICD-10-CM | POA: Diagnosis not present

## 2019-08-01 DIAGNOSIS — Z940 Kidney transplant status: Secondary | ICD-10-CM | POA: Diagnosis not present

## 2019-08-01 DIAGNOSIS — Z6841 Body Mass Index (BMI) 40.0 and over, adult: Secondary | ICD-10-CM | POA: Diagnosis not present

## 2019-08-01 DIAGNOSIS — R600 Localized edema: Secondary | ICD-10-CM | POA: Diagnosis not present

## 2019-08-01 DIAGNOSIS — J9601 Acute respiratory failure with hypoxia: Secondary | ICD-10-CM | POA: Diagnosis not present

## 2019-08-01 DIAGNOSIS — I130 Hypertensive heart and chronic kidney disease with heart failure and stage 1 through stage 4 chronic kidney disease, or unspecified chronic kidney disease: Secondary | ICD-10-CM | POA: Diagnosis not present

## 2019-08-01 DIAGNOSIS — I5033 Acute on chronic diastolic (congestive) heart failure: Secondary | ICD-10-CM | POA: Diagnosis not present

## 2019-08-01 DIAGNOSIS — R0602 Shortness of breath: Secondary | ICD-10-CM | POA: Diagnosis not present

## 2019-08-01 DIAGNOSIS — Z79899 Other long term (current) drug therapy: Principal | ICD-10-CM

## 2019-08-01 DIAGNOSIS — R9389 Abnormal findings on diagnostic imaging of other specified body structures: Principal | ICD-10-CM

## 2019-08-01 DIAGNOSIS — R05 Cough: Principal | ICD-10-CM

## 2019-08-02 ENCOUNTER — Ambulatory Visit
Admit: 2019-08-02 | Discharge: 2019-08-06 | Disposition: A | Discharge status: Home Health | Payer: MEDICARE | Admitting: Nephrology

## 2019-08-02 DIAGNOSIS — D473 Essential (hemorrhagic) thrombocythemia: Secondary | ICD-10-CM | POA: Diagnosis not present

## 2019-08-02 DIAGNOSIS — I110 Hypertensive heart disease with heart failure: Secondary | ICD-10-CM | POA: Diagnosis not present

## 2019-08-02 DIAGNOSIS — R0902 Hypoxemia: Secondary | ICD-10-CM | POA: Diagnosis not present

## 2019-08-02 DIAGNOSIS — Z940 Kidney transplant status: Secondary | ICD-10-CM | POA: Diagnosis not present

## 2019-08-02 DIAGNOSIS — E785 Hyperlipidemia, unspecified: Secondary | ICD-10-CM | POA: Diagnosis not present

## 2019-08-02 DIAGNOSIS — G4733 Obstructive sleep apnea (adult) (pediatric): Secondary | ICD-10-CM | POA: Diagnosis not present

## 2019-08-02 DIAGNOSIS — J9601 Acute respiratory failure with hypoxia: Secondary | ICD-10-CM | POA: Diagnosis not present

## 2019-08-02 DIAGNOSIS — J1282 Pneumonia due to coronavirus disease 2019: Secondary | ICD-10-CM | POA: Diagnosis not present

## 2019-08-02 DIAGNOSIS — Z87891 Personal history of nicotine dependence: Secondary | ICD-10-CM | POA: Diagnosis not present

## 2019-08-02 DIAGNOSIS — U071 COVID-19: Secondary | ICD-10-CM | POA: Diagnosis not present

## 2019-08-02 DIAGNOSIS — D849 Immunodeficiency, unspecified: Secondary | ICD-10-CM | POA: Diagnosis not present

## 2019-08-02 DIAGNOSIS — Z87440 Personal history of urinary (tract) infections: Secondary | ICD-10-CM | POA: Diagnosis not present

## 2019-08-02 DIAGNOSIS — Z79899 Other long term (current) drug therapy: Secondary | ICD-10-CM | POA: Diagnosis not present

## 2019-08-02 DIAGNOSIS — K219 Gastro-esophageal reflux disease without esophagitis: Secondary | ICD-10-CM | POA: Diagnosis present

## 2019-08-02 DIAGNOSIS — I272 Pulmonary hypertension, unspecified: Secondary | ICD-10-CM | POA: Diagnosis not present

## 2019-08-02 DIAGNOSIS — J189 Pneumonia, unspecified organism: Secondary | ICD-10-CM | POA: Diagnosis not present

## 2019-08-02 DIAGNOSIS — Z7982 Long term (current) use of aspirin: Secondary | ICD-10-CM | POA: Diagnosis not present

## 2019-08-02 DIAGNOSIS — I130 Hypertensive heart and chronic kidney disease with heart failure and stage 1 through stage 4 chronic kidney disease, or unspecified chronic kidney disease: Secondary | ICD-10-CM | POA: Diagnosis present

## 2019-08-02 DIAGNOSIS — I5030 Unspecified diastolic (congestive) heart failure: Secondary | ICD-10-CM | POA: Diagnosis not present

## 2019-08-02 DIAGNOSIS — N189 Chronic kidney disease, unspecified: Secondary | ICD-10-CM | POA: Diagnosis present

## 2019-08-02 DIAGNOSIS — I5033 Acute on chronic diastolic (congestive) heart failure: Secondary | ICD-10-CM | POA: Diagnosis present

## 2019-08-02 DIAGNOSIS — Z6841 Body Mass Index (BMI) 40.0 and over, adult: Secondary | ICD-10-CM | POA: Diagnosis not present

## 2019-08-02 DIAGNOSIS — D539 Nutritional anemia, unspecified: Secondary | ICD-10-CM | POA: Diagnosis not present

## 2019-08-02 DIAGNOSIS — B948 Sequelae of other specified infectious and parasitic diseases: Secondary | ICD-10-CM | POA: Diagnosis not present

## 2019-08-02 DIAGNOSIS — Z8616 Personal history of COVID-19: Secondary | ICD-10-CM | POA: Diagnosis not present

## 2019-08-02 DIAGNOSIS — I10 Essential (primary) hypertension: Secondary | ICD-10-CM | POA: Diagnosis not present

## 2019-08-02 DIAGNOSIS — R918 Other nonspecific abnormal finding of lung field: Secondary | ICD-10-CM | POA: Diagnosis not present

## 2019-08-02 DIAGNOSIS — M1990 Unspecified osteoarthritis, unspecified site: Secondary | ICD-10-CM | POA: Diagnosis present

## 2019-08-02 DIAGNOSIS — R600 Localized edema: Secondary | ICD-10-CM | POA: Diagnosis not present

## 2019-08-02 DIAGNOSIS — R609 Edema, unspecified: Secondary | ICD-10-CM | POA: Diagnosis not present

## 2019-08-02 DIAGNOSIS — Z85828 Personal history of other malignant neoplasm of skin: Secondary | ICD-10-CM | POA: Diagnosis not present

## 2019-08-02 DIAGNOSIS — I499 Cardiac arrhythmia, unspecified: Secondary | ICD-10-CM | POA: Diagnosis not present

## 2019-08-02 DIAGNOSIS — M7989 Other specified soft tissue disorders: Secondary | ICD-10-CM | POA: Diagnosis not present

## 2019-08-02 DIAGNOSIS — D6859 Other primary thrombophilia: Secondary | ICD-10-CM | POA: Diagnosis not present

## 2019-08-02 DIAGNOSIS — R0602 Shortness of breath: Secondary | ICD-10-CM | POA: Diagnosis not present

## 2019-08-06 ENCOUNTER — Telehealth: Payer: Self-pay

## 2019-08-06 ENCOUNTER — Telehealth: Payer: Self-pay | Admitting: Internal Medicine

## 2019-08-06 DIAGNOSIS — N184 Chronic kidney disease, stage 4 (severe): Principal | ICD-10-CM

## 2019-08-06 DIAGNOSIS — D631 Anemia in chronic kidney disease: Principal | ICD-10-CM

## 2019-08-06 MED ORDER — AMLODIPINE 5 MG TABLET
Freq: Two times a day (BID) | ORAL | 0.00000 days
Start: 2019-08-06 — End: ?

## 2019-08-06 MED ORDER — AZELASTINE 137 MCG (0.1 %) NASAL SPRAY AEROSOL
Freq: Two times a day (BID) | NASAL | 0 days | PRN
Start: 2019-08-06 — End: 2020-08-05

## 2019-08-06 MED ORDER — FLUTICASONE PROPIONATE 50 MCG/ACTUATION NASAL SPRAY,SUSPENSION
Freq: Two times a day (BID) | NASAL | 11 refills | 0 days | Status: CP | PRN
Start: 2019-08-06 — End: 2020-02-22

## 2019-08-06 MED ORDER — LOSARTAN 50 MG TABLET
ORAL_TABLET | Freq: Every evening | ORAL | 0 refills | 30 days
Start: 2019-08-06 — End: 2019-09-05

## 2019-08-06 MED ORDER — HYDROXYUREA 500 MG CAPSULE
Freq: Two times a day (BID) | ORAL | 0 days
Start: 2019-08-06 — End: ?

## 2019-08-06 NOTE — Telephone Encounter (Signed)
Noted. Will follow.  

## 2019-08-06 NOTE — Telephone Encounter (Signed)
Ok

## 2019-08-06 NOTE — Telephone Encounter (Signed)
Patient will be receiving enhanced Home care form [facility] which means there providers will be covering her until she is cleared of COVID. This allow for more enhanced treatment of patient .  I advised you would be ok with this until cleared of COVID.

## 2019-08-06 NOTE — Telephone Encounter (Signed)
Patient has been scheduled for a virtual Hospital follow up for 08/09/2019 at 4pm.

## 2019-08-06 NOTE — Telephone Encounter (Signed)
Shelly from [facility] called and said they are discharging patient from hospital. She had covid. They have her set up with enhanced home health with [facility] but need Dr. Nicki Reaper to say this okay. She is requesting a call back @ [phone].

## 2019-08-07 NOTE — Telephone Encounter (Signed)
Failed attempt to reach patient for transition of care. Hospital follow up scheduled. Will follow.

## 2019-08-08 ENCOUNTER — Telehealth: Admit: 2019-08-08 | Discharge: 2019-08-09 | Payer: MEDICARE

## 2019-08-08 DIAGNOSIS — J9601 Acute respiratory failure with hypoxia: Principal | ICD-10-CM

## 2019-08-09 ENCOUNTER — Encounter: Admit: 2019-08-09 | Discharge: 2019-08-15 | Payer: MEDICARE

## 2019-08-09 ENCOUNTER — Inpatient Hospital Stay: Admit: 2019-08-09 | Discharge: 2019-08-15 | Payer: MEDICARE

## 2019-08-09 ENCOUNTER — Ambulatory Visit: Payer: Medicare Other | Admitting: Internal Medicine

## 2019-08-09 DIAGNOSIS — Z940 Kidney transplant status: Secondary | ICD-10-CM | POA: Diagnosis not present

## 2019-08-09 DIAGNOSIS — I5032 Chronic diastolic (congestive) heart failure: Secondary | ICD-10-CM | POA: Diagnosis not present

## 2019-08-09 DIAGNOSIS — D849 Immunodeficiency, unspecified: Secondary | ICD-10-CM | POA: Diagnosis not present

## 2019-08-09 DIAGNOSIS — U071 COVID-19: Secondary | ICD-10-CM | POA: Diagnosis not present

## 2019-08-09 DIAGNOSIS — I130 Hypertensive heart and chronic kidney disease with heart failure and stage 1 through stage 4 chronic kidney disease, or unspecified chronic kidney disease: Secondary | ICD-10-CM | POA: Diagnosis not present

## 2019-08-09 DIAGNOSIS — Z6839 Body mass index (BMI) 39.0-39.9, adult: Secondary | ICD-10-CM | POA: Diagnosis not present

## 2019-08-09 DIAGNOSIS — Z87891 Personal history of nicotine dependence: Secondary | ICD-10-CM | POA: Diagnosis not present

## 2019-08-09 DIAGNOSIS — I5030 Unspecified diastolic (congestive) heart failure: Secondary | ICD-10-CM | POA: Diagnosis not present

## 2019-08-09 DIAGNOSIS — N189 Chronic kidney disease, unspecified: Secondary | ICD-10-CM | POA: Diagnosis not present

## 2019-08-09 DIAGNOSIS — Z9981 Dependence on supplemental oxygen: Secondary | ICD-10-CM | POA: Diagnosis not present

## 2019-08-09 DIAGNOSIS — D6869 Other thrombophilia: Secondary | ICD-10-CM | POA: Diagnosis not present

## 2019-08-09 DIAGNOSIS — Z87440 Personal history of urinary (tract) infections: Secondary | ICD-10-CM | POA: Diagnosis not present

## 2019-08-09 DIAGNOSIS — J9601 Acute respiratory failure with hypoxia: Secondary | ICD-10-CM | POA: Diagnosis not present

## 2019-08-09 DIAGNOSIS — E669 Obesity, unspecified: Secondary | ICD-10-CM | POA: Diagnosis not present

## 2019-08-09 DIAGNOSIS — M170 Bilateral primary osteoarthritis of knee: Secondary | ICD-10-CM | POA: Diagnosis not present

## 2019-08-09 DIAGNOSIS — I272 Pulmonary hypertension, unspecified: Secondary | ICD-10-CM | POA: Diagnosis not present

## 2019-08-09 DIAGNOSIS — D631 Anemia in chronic kidney disease: Secondary | ICD-10-CM | POA: Diagnosis not present

## 2019-08-09 DIAGNOSIS — D473 Essential (hemorrhagic) thrombocythemia: Secondary | ICD-10-CM | POA: Diagnosis not present

## 2019-08-09 DIAGNOSIS — G4733 Obstructive sleep apnea (adult) (pediatric): Secondary | ICD-10-CM | POA: Diagnosis not present

## 2019-08-09 DIAGNOSIS — D539 Nutritional anemia, unspecified: Secondary | ICD-10-CM | POA: Diagnosis not present

## 2019-08-09 DIAGNOSIS — K219 Gastro-esophageal reflux disease without esophagitis: Secondary | ICD-10-CM | POA: Diagnosis not present

## 2019-08-09 NOTE — Telephone Encounter (Signed)
Contacted patient for transition of care. Patient states she is currently being covered by [place] with physician through [facility] post covid. See note on 08/06/19.  In office visit with pcp is deferred to a virtual visit. Patient declines virtual visit. Appointment canceled per patient request.

## 2019-08-09 NOTE — Telephone Encounter (Signed)
Returned call to patient per PCP request to find out why not scheduling virtual appointment, patient said because I do not have COVID and I want an in offfce visit. Advised patient we were informed by [facility] that patient was being discharged hospitalized due to [place] related Pneumonia an that [facility] would be taking care of patient until clear of pneumonia for at least 2 weeks and that their doctors would be providing care. Advised patient anyone with COVID or COVID like symptoms we could not per protocol bring into the office but would be happy to schedule a virtual visit with PCP. Patient declined virtual and stated she did not understand why she could not come into the office ,I again advised same protocol for all patient and offered virtual. Patient declined and husband stated he would talk with infectious disease and [facility]. That Patient has an appointment with [facility] doctor tomorrow virtually.

## 2019-08-10 ENCOUNTER — Telehealth: Admit: 2019-08-10 | Discharge: 2019-08-11 | Payer: MEDICARE

## 2019-08-10 DIAGNOSIS — D6869 Other thrombophilia: Secondary | ICD-10-CM | POA: Diagnosis not present

## 2019-08-10 DIAGNOSIS — I130 Hypertensive heart and chronic kidney disease with heart failure and stage 1 through stage 4 chronic kidney disease, or unspecified chronic kidney disease: Secondary | ICD-10-CM | POA: Diagnosis not present

## 2019-08-10 DIAGNOSIS — U071 COVID-19: Secondary | ICD-10-CM | POA: Diagnosis not present

## 2019-08-10 DIAGNOSIS — Z9989 Dependence on other enabling machines and devices: Secondary | ICD-10-CM | POA: Diagnosis not present

## 2019-08-10 DIAGNOSIS — I10 Essential (primary) hypertension: Secondary | ICD-10-CM | POA: Diagnosis not present

## 2019-08-10 DIAGNOSIS — D539 Nutritional anemia, unspecified: Secondary | ICD-10-CM | POA: Diagnosis not present

## 2019-08-10 DIAGNOSIS — N189 Chronic kidney disease, unspecified: Secondary | ICD-10-CM | POA: Diagnosis not present

## 2019-08-10 DIAGNOSIS — I5032 Chronic diastolic (congestive) heart failure: Secondary | ICD-10-CM | POA: Diagnosis not present

## 2019-08-10 DIAGNOSIS — G4733 Obstructive sleep apnea (adult) (pediatric): Secondary | ICD-10-CM | POA: Diagnosis not present

## 2019-08-10 DIAGNOSIS — J9601 Acute respiratory failure with hypoxia: Secondary | ICD-10-CM | POA: Diagnosis not present

## 2019-08-10 DIAGNOSIS — I5030 Unspecified diastolic (congestive) heart failure: Principal | ICD-10-CM

## 2019-08-10 NOTE — Telephone Encounter (Signed)
Discussed with Juliann Pulse. Pt has plan in placed for continued care and follow up until clear of covid.  Per discussion, visit tomorrow with [facility] MD.

## 2019-08-11 ENCOUNTER — Encounter: Admit: 2019-08-11 | Discharge: 2019-08-11 | Payer: MEDICARE

## 2019-08-11 DIAGNOSIS — I5032 Chronic diastolic (congestive) heart failure: Secondary | ICD-10-CM | POA: Diagnosis not present

## 2019-08-11 DIAGNOSIS — I509 Heart failure, unspecified: Secondary | ICD-10-CM | POA: Diagnosis not present

## 2019-08-11 DIAGNOSIS — J9601 Acute respiratory failure with hypoxia: Secondary | ICD-10-CM | POA: Diagnosis not present

## 2019-08-11 DIAGNOSIS — U071 COVID-19: Secondary | ICD-10-CM | POA: Diagnosis not present

## 2019-08-11 DIAGNOSIS — D6869 Other thrombophilia: Secondary | ICD-10-CM | POA: Diagnosis not present

## 2019-08-11 DIAGNOSIS — N189 Chronic kidney disease, unspecified: Secondary | ICD-10-CM | POA: Diagnosis not present

## 2019-08-11 DIAGNOSIS — I130 Hypertensive heart and chronic kidney disease with heart failure and stage 1 through stage 4 chronic kidney disease, or unspecified chronic kidney disease: Secondary | ICD-10-CM | POA: Diagnosis not present

## 2019-08-13 DIAGNOSIS — J9601 Acute respiratory failure with hypoxia: Principal | ICD-10-CM

## 2019-08-13 DIAGNOSIS — I5030 Unspecified diastolic (congestive) heart failure: Principal | ICD-10-CM

## 2019-08-13 DIAGNOSIS — U071 COVID-19: Principal | ICD-10-CM

## 2019-08-14 DIAGNOSIS — I130 Hypertensive heart and chronic kidney disease with heart failure and stage 1 through stage 4 chronic kidney disease, or unspecified chronic kidney disease: Secondary | ICD-10-CM | POA: Diagnosis not present

## 2019-08-14 DIAGNOSIS — J9601 Acute respiratory failure with hypoxia: Secondary | ICD-10-CM | POA: Diagnosis not present

## 2019-08-14 DIAGNOSIS — N189 Chronic kidney disease, unspecified: Secondary | ICD-10-CM | POA: Diagnosis not present

## 2019-08-14 DIAGNOSIS — D6869 Other thrombophilia: Secondary | ICD-10-CM | POA: Diagnosis not present

## 2019-08-14 DIAGNOSIS — U071 COVID-19: Secondary | ICD-10-CM | POA: Diagnosis not present

## 2019-08-14 DIAGNOSIS — I5032 Chronic diastolic (congestive) heart failure: Secondary | ICD-10-CM | POA: Diagnosis not present

## 2019-08-14 DIAGNOSIS — I5030 Unspecified diastolic (congestive) heart failure: Principal | ICD-10-CM

## 2019-08-15 ENCOUNTER — Telehealth: Admit: 2019-08-15 | Discharge: 2019-08-16 | Payer: MEDICARE

## 2019-08-15 DIAGNOSIS — Z9989 Dependence on other enabling machines and devices: Secondary | ICD-10-CM | POA: Diagnosis not present

## 2019-08-15 DIAGNOSIS — D6869 Other thrombophilia: Secondary | ICD-10-CM | POA: Diagnosis not present

## 2019-08-15 DIAGNOSIS — D539 Nutritional anemia, unspecified: Secondary | ICD-10-CM | POA: Diagnosis not present

## 2019-08-15 DIAGNOSIS — I5030 Unspecified diastolic (congestive) heart failure: Secondary | ICD-10-CM | POA: Diagnosis not present

## 2019-08-15 DIAGNOSIS — G4733 Obstructive sleep apnea (adult) (pediatric): Secondary | ICD-10-CM | POA: Diagnosis not present

## 2019-08-15 DIAGNOSIS — I5032 Chronic diastolic (congestive) heart failure: Secondary | ICD-10-CM | POA: Diagnosis not present

## 2019-08-15 DIAGNOSIS — J9601 Acute respiratory failure with hypoxia: Secondary | ICD-10-CM | POA: Diagnosis not present

## 2019-08-15 DIAGNOSIS — I130 Hypertensive heart and chronic kidney disease with heart failure and stage 1 through stage 4 chronic kidney disease, or unspecified chronic kidney disease: Secondary | ICD-10-CM | POA: Diagnosis not present

## 2019-08-15 DIAGNOSIS — U071 COVID-19: Secondary | ICD-10-CM | POA: Diagnosis not present

## 2019-08-15 DIAGNOSIS — N189 Chronic kidney disease, unspecified: Secondary | ICD-10-CM | POA: Diagnosis not present

## 2019-08-17 DIAGNOSIS — I5030 Unspecified diastolic (congestive) heart failure: Principal | ICD-10-CM

## 2019-08-17 DIAGNOSIS — J9601 Acute respiratory failure with hypoxia: Principal | ICD-10-CM

## 2019-08-17 DIAGNOSIS — U071 COVID-19: Principal | ICD-10-CM

## 2019-08-21 DIAGNOSIS — I5030 Unspecified diastolic (congestive) heart failure: Principal | ICD-10-CM

## 2019-08-21 DIAGNOSIS — U071 COVID-19: Principal | ICD-10-CM

## 2019-08-21 DIAGNOSIS — J9601 Acute respiratory failure with hypoxia: Principal | ICD-10-CM

## 2019-08-23 DIAGNOSIS — D849 Immunodeficiency, unspecified: Secondary | ICD-10-CM | POA: Diagnosis not present

## 2019-08-23 DIAGNOSIS — I272 Pulmonary hypertension, unspecified: Secondary | ICD-10-CM | POA: Diagnosis not present

## 2019-08-23 DIAGNOSIS — I130 Hypertensive heart and chronic kidney disease with heart failure and stage 1 through stage 4 chronic kidney disease, or unspecified chronic kidney disease: Secondary | ICD-10-CM | POA: Diagnosis not present

## 2019-08-23 DIAGNOSIS — E669 Obesity, unspecified: Secondary | ICD-10-CM | POA: Diagnosis not present

## 2019-08-23 DIAGNOSIS — D6869 Other thrombophilia: Secondary | ICD-10-CM | POA: Diagnosis not present

## 2019-08-23 DIAGNOSIS — G4733 Obstructive sleep apnea (adult) (pediatric): Secondary | ICD-10-CM | POA: Diagnosis not present

## 2019-08-23 DIAGNOSIS — M170 Bilateral primary osteoarthritis of knee: Secondary | ICD-10-CM | POA: Diagnosis not present

## 2019-08-23 DIAGNOSIS — J9601 Acute respiratory failure with hypoxia: Secondary | ICD-10-CM | POA: Diagnosis not present

## 2019-08-23 DIAGNOSIS — D473 Essential (hemorrhagic) thrombocythemia: Secondary | ICD-10-CM | POA: Diagnosis not present

## 2019-08-23 DIAGNOSIS — I5032 Chronic diastolic (congestive) heart failure: Secondary | ICD-10-CM | POA: Diagnosis not present

## 2019-08-23 DIAGNOSIS — N189 Chronic kidney disease, unspecified: Secondary | ICD-10-CM | POA: Diagnosis not present

## 2019-08-23 DIAGNOSIS — U071 COVID-19: Secondary | ICD-10-CM | POA: Diagnosis not present

## 2019-08-27 DIAGNOSIS — N182 Chronic kidney disease, stage 2 (mild): Principal | ICD-10-CM

## 2019-08-27 DIAGNOSIS — Z79899 Other long term (current) drug therapy: Principal | ICD-10-CM

## 2019-08-27 DIAGNOSIS — Z940 Kidney transplant status: Principal | ICD-10-CM

## 2019-08-27 DIAGNOSIS — D631 Anemia in chronic kidney disease: Principal | ICD-10-CM

## 2019-08-28 DIAGNOSIS — U071 COVID-19: Principal | ICD-10-CM

## 2019-08-28 DIAGNOSIS — J9601 Acute respiratory failure with hypoxia: Principal | ICD-10-CM

## 2019-08-28 DIAGNOSIS — I5030 Unspecified diastolic (congestive) heart failure: Principal | ICD-10-CM

## 2019-08-30 DIAGNOSIS — U071 COVID-19: Principal | ICD-10-CM

## 2019-08-30 DIAGNOSIS — J9601 Acute respiratory failure with hypoxia: Principal | ICD-10-CM

## 2019-08-30 DIAGNOSIS — I5030 Unspecified diastolic (congestive) heart failure: Principal | ICD-10-CM

## 2019-09-05 ENCOUNTER — Ambulatory Visit: Admit: 2019-09-05 | Discharge: 2019-09-06 | Payer: MEDICARE

## 2019-09-05 DIAGNOSIS — I129 Hypertensive chronic kidney disease with stage 1 through stage 4 chronic kidney disease, or unspecified chronic kidney disease: Secondary | ICD-10-CM | POA: Diagnosis not present

## 2019-09-05 DIAGNOSIS — Z940 Kidney transplant status: Secondary | ICD-10-CM | POA: Diagnosis not present

## 2019-09-05 DIAGNOSIS — D631 Anemia in chronic kidney disease: Secondary | ICD-10-CM | POA: Diagnosis not present

## 2019-09-05 DIAGNOSIS — D539 Nutritional anemia, unspecified: Secondary | ICD-10-CM | POA: Diagnosis not present

## 2019-09-05 DIAGNOSIS — N182 Chronic kidney disease, stage 2 (mild): Secondary | ICD-10-CM | POA: Diagnosis not present

## 2019-09-05 DIAGNOSIS — D473 Essential (hemorrhagic) thrombocythemia: Secondary | ICD-10-CM | POA: Diagnosis not present

## 2019-09-05 DIAGNOSIS — Z79899 Other long term (current) drug therapy: Secondary | ICD-10-CM | POA: Diagnosis not present

## 2019-09-08 DIAGNOSIS — Z87440 Personal history of urinary (tract) infections: Secondary | ICD-10-CM | POA: Diagnosis not present

## 2019-09-08 DIAGNOSIS — Z9981 Dependence on supplemental oxygen: Secondary | ICD-10-CM | POA: Diagnosis not present

## 2019-09-08 DIAGNOSIS — E669 Obesity, unspecified: Secondary | ICD-10-CM | POA: Diagnosis not present

## 2019-09-08 DIAGNOSIS — D473 Essential (hemorrhagic) thrombocythemia: Secondary | ICD-10-CM | POA: Diagnosis not present

## 2019-09-08 DIAGNOSIS — J9601 Acute respiratory failure with hypoxia: Secondary | ICD-10-CM | POA: Diagnosis not present

## 2019-09-08 DIAGNOSIS — N189 Chronic kidney disease, unspecified: Secondary | ICD-10-CM | POA: Diagnosis not present

## 2019-09-08 DIAGNOSIS — D539 Nutritional anemia, unspecified: Secondary | ICD-10-CM | POA: Diagnosis not present

## 2019-09-08 DIAGNOSIS — D631 Anemia in chronic kidney disease: Secondary | ICD-10-CM | POA: Diagnosis not present

## 2019-09-08 DIAGNOSIS — D849 Immunodeficiency, unspecified: Secondary | ICD-10-CM | POA: Diagnosis not present

## 2019-09-08 DIAGNOSIS — I130 Hypertensive heart and chronic kidney disease with heart failure and stage 1 through stage 4 chronic kidney disease, or unspecified chronic kidney disease: Secondary | ICD-10-CM | POA: Diagnosis not present

## 2019-09-08 DIAGNOSIS — I5032 Chronic diastolic (congestive) heart failure: Secondary | ICD-10-CM | POA: Diagnosis not present

## 2019-09-08 DIAGNOSIS — K219 Gastro-esophageal reflux disease without esophagitis: Secondary | ICD-10-CM | POA: Diagnosis not present

## 2019-09-08 DIAGNOSIS — D6869 Other thrombophilia: Secondary | ICD-10-CM | POA: Diagnosis not present

## 2019-09-08 DIAGNOSIS — Z940 Kidney transplant status: Secondary | ICD-10-CM | POA: Diagnosis not present

## 2019-09-08 DIAGNOSIS — M170 Bilateral primary osteoarthritis of knee: Secondary | ICD-10-CM | POA: Diagnosis not present

## 2019-09-08 DIAGNOSIS — G4733 Obstructive sleep apnea (adult) (pediatric): Secondary | ICD-10-CM | POA: Diagnosis not present

## 2019-09-08 DIAGNOSIS — U071 COVID-19: Secondary | ICD-10-CM | POA: Diagnosis not present

## 2019-09-08 DIAGNOSIS — I272 Pulmonary hypertension, unspecified: Secondary | ICD-10-CM | POA: Diagnosis not present

## 2019-09-08 DIAGNOSIS — Z87891 Personal history of nicotine dependence: Secondary | ICD-10-CM | POA: Diagnosis not present

## 2019-09-08 DIAGNOSIS — Z6839 Body mass index (BMI) 39.0-39.9, adult: Secondary | ICD-10-CM | POA: Diagnosis not present

## 2019-09-13 DIAGNOSIS — Z940 Kidney transplant status: Principal | ICD-10-CM

## 2019-09-13 DIAGNOSIS — Z79899 Other long term (current) drug therapy: Principal | ICD-10-CM

## 2019-09-14 ENCOUNTER — Ambulatory Visit: Admit: 2019-09-14 | Discharge: 2019-09-14 | Payer: MEDICARE | Attending: Nephrology | Primary: Nephrology

## 2019-09-14 ENCOUNTER — Ambulatory Visit: Admit: 2019-09-14 | Discharge: 2019-09-14 | Payer: MEDICARE

## 2019-09-14 DIAGNOSIS — R600 Localized edema: Secondary | ICD-10-CM | POA: Diagnosis not present

## 2019-09-14 DIAGNOSIS — D849 Immunodeficiency, unspecified: Secondary | ICD-10-CM | POA: Diagnosis not present

## 2019-09-14 DIAGNOSIS — D473 Essential (hemorrhagic) thrombocythemia: Secondary | ICD-10-CM | POA: Diagnosis not present

## 2019-09-14 DIAGNOSIS — J449 Chronic obstructive pulmonary disease, unspecified: Secondary | ICD-10-CM | POA: Diagnosis not present

## 2019-09-14 DIAGNOSIS — Z79899 Other long term (current) drug therapy: Secondary | ICD-10-CM | POA: Diagnosis not present

## 2019-09-14 DIAGNOSIS — Z940 Kidney transplant status: Secondary | ICD-10-CM | POA: Diagnosis not present

## 2019-09-14 DIAGNOSIS — I110 Hypertensive heart disease with heart failure: Secondary | ICD-10-CM | POA: Diagnosis not present

## 2019-09-14 DIAGNOSIS — Z7982 Long term (current) use of aspirin: Secondary | ICD-10-CM | POA: Diagnosis not present

## 2019-09-14 DIAGNOSIS — I5032 Chronic diastolic (congestive) heart failure: Secondary | ICD-10-CM | POA: Diagnosis not present

## 2019-09-14 DIAGNOSIS — Z87440 Personal history of urinary (tract) infections: Secondary | ICD-10-CM | POA: Diagnosis not present

## 2019-09-14 DIAGNOSIS — D649 Anemia, unspecified: Secondary | ICD-10-CM | POA: Diagnosis not present

## 2019-09-14 DIAGNOSIS — M170 Bilateral primary osteoarthritis of knee: Secondary | ICD-10-CM | POA: Diagnosis not present

## 2019-09-14 DIAGNOSIS — Z791 Long term (current) use of non-steroidal anti-inflammatories (NSAID): Secondary | ICD-10-CM | POA: Diagnosis not present

## 2019-09-14 DIAGNOSIS — Z8616 Personal history of COVID-19: Secondary | ICD-10-CM | POA: Diagnosis not present

## 2019-09-14 DIAGNOSIS — D899 Disorder involving the immune mechanism, unspecified: Principal | ICD-10-CM

## 2019-09-14 MED ORDER — FUROSEMIDE 40 MG TABLET
ORAL_TABLET | Freq: Two times a day (BID) | ORAL | 11 refills | 30.00000 days | Status: CP
Start: 2019-09-14 — End: 2020-09-13

## 2019-09-21 DIAGNOSIS — Z940 Kidney transplant status: Principal | ICD-10-CM

## 2019-10-14 DIAGNOSIS — J811 Chronic pulmonary edema: Principal | ICD-10-CM

## 2019-10-14 DIAGNOSIS — J449 Chronic obstructive pulmonary disease, unspecified: Principal | ICD-10-CM

## 2019-10-14 DIAGNOSIS — Z940 Kidney transplant status: Principal | ICD-10-CM

## 2019-11-22 ENCOUNTER — Other Ambulatory Visit: Admit: 2019-11-22 | Discharge: 2019-11-23 | Payer: MEDICARE

## 2019-11-22 DIAGNOSIS — Z8739 Personal history of other diseases of the musculoskeletal system and connective tissue: Secondary | ICD-10-CM | POA: Diagnosis not present

## 2019-11-22 DIAGNOSIS — Z79899 Other long term (current) drug therapy: Secondary | ICD-10-CM | POA: Diagnosis not present

## 2019-11-22 DIAGNOSIS — D631 Anemia in chronic kidney disease: Secondary | ICD-10-CM | POA: Diagnosis not present

## 2019-11-22 DIAGNOSIS — I5030 Unspecified diastolic (congestive) heart failure: Secondary | ICD-10-CM | POA: Diagnosis not present

## 2019-11-22 DIAGNOSIS — N182 Chronic kidney disease, stage 2 (mild): Secondary | ICD-10-CM | POA: Diagnosis not present

## 2019-11-22 DIAGNOSIS — Z940 Kidney transplant status: Secondary | ICD-10-CM | POA: Diagnosis not present

## 2019-11-23 ENCOUNTER — Ambulatory Visit: Admit: 2019-11-23 | Discharge: 2019-11-23 | Payer: MEDICARE

## 2019-11-23 DIAGNOSIS — R0600 Dyspnea, unspecified: Secondary | ICD-10-CM | POA: Diagnosis not present

## 2019-11-23 DIAGNOSIS — D849 Immunodeficiency, unspecified: Secondary | ICD-10-CM | POA: Diagnosis not present

## 2019-11-23 DIAGNOSIS — M79644 Pain in right finger(s): Secondary | ICD-10-CM | POA: Diagnosis not present

## 2019-11-23 DIAGNOSIS — F5101 Primary insomnia: Secondary | ICD-10-CM | POA: Diagnosis not present

## 2019-11-23 DIAGNOSIS — I5030 Unspecified diastolic (congestive) heart failure: Secondary | ICD-10-CM | POA: Diagnosis not present

## 2019-11-23 DIAGNOSIS — M19041 Primary osteoarthritis, right hand: Secondary | ICD-10-CM | POA: Diagnosis not present

## 2019-11-23 DIAGNOSIS — N390 Urinary tract infection, site not specified: Secondary | ICD-10-CM | POA: Diagnosis not present

## 2019-11-23 DIAGNOSIS — D473 Essential (hemorrhagic) thrombocythemia: Secondary | ICD-10-CM | POA: Diagnosis not present

## 2019-11-23 DIAGNOSIS — Z6837 Body mass index (BMI) 37.0-37.9, adult: Secondary | ICD-10-CM | POA: Diagnosis not present

## 2019-11-23 DIAGNOSIS — Z8739 Personal history of other diseases of the musculoskeletal system and connective tissue: Secondary | ICD-10-CM | POA: Diagnosis not present

## 2019-11-23 MED ORDER — COLCHICINE 0.6 MG TABLET
ORAL_TABLET | ORAL | 0 refills | 0.00000 days | Status: CP
Start: 2019-11-23 — End: ?

## 2019-11-25 DIAGNOSIS — Z940 Kidney transplant status: Principal | ICD-10-CM

## 2019-11-25 MED ORDER — AZITHROMYCIN 250 MG TABLET
ORAL_TABLET | 0 refills | 5 days | Status: CP
Start: 2019-11-25 — End: 2019-11-30

## 2019-11-26 DIAGNOSIS — I10 Essential (primary) hypertension: Principal | ICD-10-CM

## 2019-11-26 MED ORDER — PRAVASTATIN 40 MG TABLET
ORAL_TABLET | Freq: Every day | ORAL | 3 refills | 0.00000 days | Status: CP
Start: 2019-11-26 — End: 2020-11-20

## 2019-11-26 MED ORDER — PRAVASTATIN 40 MG TABLET: 40 mg | tablet | Freq: Every day | 3 refills | 90 days | Status: AC

## 2019-11-26 MED ORDER — PANTOPRAZOLE 40 MG TABLET,DELAYED RELEASE
ORAL_TABLET | Freq: Every day | ORAL | 3 refills | 90.00000 days | Status: CP
Start: 2019-11-26 — End: 2020-11-25

## 2019-11-27 MED ORDER — SODIUM BICARBONATE 325 MG TABLET
Freq: Two times a day (BID) | ORAL | 0 days
Start: 2019-11-27 — End: ?

## 2019-12-26 DIAGNOSIS — Z940 Kidney transplant status: Principal | ICD-10-CM

## 2019-12-26 DIAGNOSIS — I272 Pulmonary hypertension, unspecified: Principal | ICD-10-CM

## 2019-12-26 DIAGNOSIS — Z79899 Other long term (current) drug therapy: Principal | ICD-10-CM

## 2019-12-26 DIAGNOSIS — I5032 Chronic diastolic (congestive) heart failure: Principal | ICD-10-CM

## 2019-12-27 ENCOUNTER — Encounter: Admit: 2019-12-27 | Discharge: 2019-12-27 | Payer: MEDICARE

## 2019-12-27 ENCOUNTER — Ambulatory Visit: Admit: 2019-12-27 | Discharge: 2019-12-27 | Payer: MEDICARE

## 2019-12-27 ENCOUNTER — Ambulatory Visit: Admit: 2019-12-27 | Discharge: 2019-12-28 | Payer: MEDICARE

## 2019-12-27 DIAGNOSIS — I5030 Unspecified diastolic (congestive) heart failure: Principal | ICD-10-CM

## 2019-12-27 DIAGNOSIS — R0600 Dyspnea, unspecified: Principal | ICD-10-CM

## 2019-12-27 DIAGNOSIS — J811 Chronic pulmonary edema: Principal | ICD-10-CM

## 2019-12-27 DIAGNOSIS — Z9989 Dependence on other enabling machines and devices: Principal | ICD-10-CM

## 2019-12-27 DIAGNOSIS — Z940 Kidney transplant status: Principal | ICD-10-CM

## 2019-12-27 DIAGNOSIS — J449 Chronic obstructive pulmonary disease, unspecified: Principal | ICD-10-CM

## 2019-12-27 DIAGNOSIS — G4733 Obstructive sleep apnea (adult) (pediatric): Principal | ICD-10-CM

## 2019-12-27 DIAGNOSIS — Z8616 History of severe acute respiratory syndrome coronavirus 2 (SARS-CoV-2) disease: Principal | ICD-10-CM

## 2019-12-28 ENCOUNTER — Ambulatory Visit: Admit: 2019-12-28 | Discharge: 2019-12-28 | Payer: MEDICARE

## 2019-12-28 ENCOUNTER — Ambulatory Visit: Admit: 2019-12-28 | Discharge: 2019-12-28 | Payer: MEDICARE | Attending: Nephrology | Primary: Nephrology

## 2019-12-28 MED ORDER — FUROSEMIDE 40 MG TABLET
ORAL_TABLET | Freq: Two times a day (BID) | ORAL | 11 refills | 30 days | Status: CP
Start: 2019-12-28 — End: 2020-12-27

## 2020-01-03 DIAGNOSIS — Z8616 History of severe acute respiratory syndrome coronavirus 2 (SARS-CoV-2) disease: Principal | ICD-10-CM

## 2020-01-03 DIAGNOSIS — J449 Chronic obstructive pulmonary disease, unspecified: Principal | ICD-10-CM

## 2020-01-03 DIAGNOSIS — J811 Chronic pulmonary edema: Principal | ICD-10-CM

## 2020-01-03 DIAGNOSIS — I5030 Unspecified diastolic (congestive) heart failure: Principal | ICD-10-CM

## 2020-01-03 DIAGNOSIS — Z940 Kidney transplant status: Principal | ICD-10-CM

## 2020-01-03 DIAGNOSIS — G4733 Obstructive sleep apnea (adult) (pediatric): Principal | ICD-10-CM

## 2020-01-03 DIAGNOSIS — Z9989 Dependence on other enabling machines and devices: Principal | ICD-10-CM

## 2020-01-11 ENCOUNTER — Ambulatory Visit: Admit: 2020-01-11 | Discharge: 2020-01-12 | Payer: MEDICARE

## 2020-01-11 DIAGNOSIS — N2889 Other specified disorders of kidney and ureter: Secondary | ICD-10-CM

## 2020-01-11 DIAGNOSIS — Z940 Kidney transplant status: Principal | ICD-10-CM

## 2020-01-11 DIAGNOSIS — I272 Pulmonary hypertension, unspecified: Principal | ICD-10-CM

## 2020-01-11 DIAGNOSIS — Z79899 Other long term (current) drug therapy: Principal | ICD-10-CM

## 2020-01-11 DIAGNOSIS — I151 Hypertension secondary to other renal disorders: Secondary | ICD-10-CM

## 2020-01-11 DIAGNOSIS — I5030 Unspecified diastolic (congestive) heart failure: Principal | ICD-10-CM

## 2020-01-17 ENCOUNTER — Ambulatory Visit: Admit: 2020-01-17 | Discharge: 2020-01-18 | Payer: MEDICARE

## 2020-01-17 DIAGNOSIS — J811 Chronic pulmonary edema: Principal | ICD-10-CM

## 2020-01-17 DIAGNOSIS — Z940 Kidney transplant status: Principal | ICD-10-CM

## 2020-01-17 DIAGNOSIS — I5030 Unspecified diastolic (congestive) heart failure: Principal | ICD-10-CM

## 2020-01-17 DIAGNOSIS — Z9989 Dependence on other enabling machines and devices: Principal | ICD-10-CM

## 2020-01-17 DIAGNOSIS — Z8616 History of severe acute respiratory syndrome coronavirus 2 (SARS-CoV-2) disease: Principal | ICD-10-CM

## 2020-01-17 DIAGNOSIS — J449 Chronic obstructive pulmonary disease, unspecified: Principal | ICD-10-CM

## 2020-01-17 DIAGNOSIS — G4733 Obstructive sleep apnea (adult) (pediatric): Principal | ICD-10-CM

## 2020-01-25 ENCOUNTER — Ambulatory Visit: Admit: 2020-01-25 | Discharge: 2020-01-26 | Payer: MEDICARE

## 2020-01-25 DIAGNOSIS — Z79899 Other long term (current) drug therapy: Secondary | ICD-10-CM | POA: Diagnosis not present

## 2020-01-25 DIAGNOSIS — N182 Chronic kidney disease, stage 2 (mild): Secondary | ICD-10-CM | POA: Diagnosis not present

## 2020-01-25 DIAGNOSIS — I272 Pulmonary hypertension, unspecified: Secondary | ICD-10-CM | POA: Diagnosis not present

## 2020-01-25 DIAGNOSIS — Z940 Kidney transplant status: Secondary | ICD-10-CM | POA: Diagnosis not present

## 2020-01-25 DIAGNOSIS — D631 Anemia in chronic kidney disease: Secondary | ICD-10-CM | POA: Diagnosis not present

## 2020-01-25 DIAGNOSIS — I5030 Unspecified diastolic (congestive) heart failure: Secondary | ICD-10-CM | POA: Diagnosis not present

## 2020-01-29 MED ORDER — AMLODIPINE 5 MG TABLET
ORAL_TABLET | Freq: Every day | ORAL | 90 days
Start: 2020-01-29 — End: ?

## 2020-01-29 MED ORDER — FUROSEMIDE 40 MG TABLET
ORAL_TABLET | Freq: Every day | ORAL | 11 refills | 30 days | Status: CP
Start: 2020-01-29 — End: 2021-01-28

## 2020-02-20 MED ORDER — SODIUM BICARBONATE 325 MG TABLET
ORAL_TABLET | 0 refills | 0 days
Start: 2020-02-20 — End: ?

## 2020-02-22 MED ORDER — SODIUM BICARBONATE 325 MG TABLET
ORAL_TABLET | Freq: Two times a day (BID) | ORAL | 11 refills | 30 days | Status: CP
Start: 2020-02-22 — End: 2021-02-21

## 2020-02-24 MED ORDER — SODIUM BICARBONATE 325 MG TABLET
ORAL_TABLET | 0 refills | 0 days | Status: CP
Start: 2020-02-24 — End: ?

## 2020-03-05 ENCOUNTER — Ambulatory Visit: Admit: 2020-03-05 | Discharge: 2020-03-06 | Payer: MEDICARE

## 2020-03-05 DIAGNOSIS — D631 Anemia in chronic kidney disease: Secondary | ICD-10-CM | POA: Diagnosis not present

## 2020-03-05 DIAGNOSIS — N182 Chronic kidney disease, stage 2 (mild): Secondary | ICD-10-CM | POA: Diagnosis not present

## 2020-03-05 DIAGNOSIS — I5030 Unspecified diastolic (congestive) heart failure: Secondary | ICD-10-CM | POA: Diagnosis not present

## 2020-03-05 DIAGNOSIS — Z23 Encounter for immunization: Secondary | ICD-10-CM | POA: Diagnosis not present

## 2020-03-05 DIAGNOSIS — Z940 Kidney transplant status: Secondary | ICD-10-CM | POA: Diagnosis not present

## 2020-03-05 DIAGNOSIS — I272 Pulmonary hypertension, unspecified: Secondary | ICD-10-CM | POA: Diagnosis not present

## 2020-03-05 DIAGNOSIS — Z79899 Other long term (current) drug therapy: Secondary | ICD-10-CM | POA: Diagnosis not present

## 2020-03-13 ENCOUNTER — Ambulatory Visit: Admit: 2020-03-13 | Discharge: 2020-03-14 | Payer: MEDICARE

## 2020-03-13 DIAGNOSIS — R0600 Dyspnea, unspecified: Secondary | ICD-10-CM | POA: Diagnosis not present

## 2020-03-13 DIAGNOSIS — N390 Urinary tract infection, site not specified: Secondary | ICD-10-CM | POA: Diagnosis not present

## 2020-03-13 DIAGNOSIS — D473 Essential (hemorrhagic) thrombocythemia: Secondary | ICD-10-CM | POA: Diagnosis not present

## 2020-03-13 DIAGNOSIS — I959 Hypotension, unspecified: Secondary | ICD-10-CM | POA: Diagnosis not present

## 2020-03-13 DIAGNOSIS — I5030 Unspecified diastolic (congestive) heart failure: Secondary | ICD-10-CM | POA: Diagnosis not present

## 2020-03-13 DIAGNOSIS — Z6837 Body mass index (BMI) 37.0-37.9, adult: Secondary | ICD-10-CM | POA: Diagnosis not present

## 2020-03-13 DIAGNOSIS — D849 Immunodeficiency, unspecified: Secondary | ICD-10-CM | POA: Diagnosis not present

## 2020-03-13 DIAGNOSIS — D649 Anemia, unspecified: Secondary | ICD-10-CM | POA: Diagnosis not present

## 2020-03-13 DIAGNOSIS — F5101 Primary insomnia: Principal | ICD-10-CM

## 2020-03-13 MED ORDER — FUROSEMIDE 20 MG TABLET
ORAL_TABLET | Freq: Every day | ORAL | 0 refills | 90.00000 days | Status: CP
Start: 2020-03-13 — End: 2020-06-11

## 2020-03-27 ENCOUNTER — Ambulatory Visit: Admit: 2020-03-27 | Discharge: 2020-03-28 | Payer: MEDICARE | Attending: Dermatology | Primary: Dermatology

## 2020-03-27 DIAGNOSIS — L821 Other seborrheic keratosis: Secondary | ICD-10-CM | POA: Diagnosis not present

## 2020-03-27 DIAGNOSIS — L570 Actinic keratosis: Secondary | ICD-10-CM | POA: Diagnosis not present

## 2020-03-27 DIAGNOSIS — L578 Other skin changes due to chronic exposure to nonionizing radiation: Secondary | ICD-10-CM | POA: Diagnosis not present

## 2020-04-01 ENCOUNTER — Ambulatory Visit: Admit: 2020-04-01 | Discharge: 2020-04-01 | Payer: MEDICARE

## 2020-04-01 DIAGNOSIS — G4733 Obstructive sleep apnea (adult) (pediatric): Secondary | ICD-10-CM | POA: Diagnosis not present

## 2020-04-01 DIAGNOSIS — Z6837 Body mass index (BMI) 37.0-37.9, adult: Secondary | ICD-10-CM | POA: Diagnosis not present

## 2020-04-01 DIAGNOSIS — D631 Anemia in chronic kidney disease: Secondary | ICD-10-CM | POA: Diagnosis not present

## 2020-04-01 DIAGNOSIS — I272 Pulmonary hypertension, unspecified: Secondary | ICD-10-CM | POA: Diagnosis not present

## 2020-04-01 DIAGNOSIS — J449 Chronic obstructive pulmonary disease, unspecified: Secondary | ICD-10-CM | POA: Diagnosis not present

## 2020-04-01 DIAGNOSIS — Z9989 Dependence on other enabling machines and devices: Secondary | ICD-10-CM | POA: Diagnosis not present

## 2020-04-01 DIAGNOSIS — Z79899 Other long term (current) drug therapy: Secondary | ICD-10-CM | POA: Diagnosis not present

## 2020-04-01 DIAGNOSIS — Z23 Encounter for immunization: Secondary | ICD-10-CM | POA: Diagnosis not present

## 2020-04-01 DIAGNOSIS — Z940 Kidney transplant status: Secondary | ICD-10-CM | POA: Diagnosis not present

## 2020-04-01 DIAGNOSIS — I5030 Unspecified diastolic (congestive) heart failure: Secondary | ICD-10-CM | POA: Diagnosis not present

## 2020-04-01 DIAGNOSIS — N182 Chronic kidney disease, stage 2 (mild): Secondary | ICD-10-CM | POA: Diagnosis not present

## 2020-04-01 DIAGNOSIS — Z8616 History of severe acute respiratory syndrome coronavirus 2 (SARS-CoV-2) disease: Principal | ICD-10-CM

## 2020-04-01 MED ORDER — BREO ELLIPTA 200 MCG-25 MCG/DOSE POWDER FOR INHALATION
Freq: Every day | RESPIRATORY_TRACT | 11 refills | 1.00000 days | Status: CP
Start: 2020-04-01 — End: ?

## 2020-04-02 ENCOUNTER — Ambulatory Visit: Admit: 2020-04-02 | Discharge: 2020-04-03 | Payer: MEDICARE | Attending: Nephrology | Primary: Nephrology

## 2020-04-02 DIAGNOSIS — D473 Essential (hemorrhagic) thrombocythemia: Secondary | ICD-10-CM | POA: Diagnosis not present

## 2020-04-02 DIAGNOSIS — Z791 Long term (current) use of non-steroidal anti-inflammatories (NSAID): Secondary | ICD-10-CM | POA: Diagnosis not present

## 2020-04-02 DIAGNOSIS — D631 Anemia in chronic kidney disease: Secondary | ICD-10-CM | POA: Diagnosis not present

## 2020-04-02 DIAGNOSIS — Z8616 Personal history of COVID-19: Secondary | ICD-10-CM | POA: Diagnosis not present

## 2020-04-02 DIAGNOSIS — N189 Chronic kidney disease, unspecified: Secondary | ICD-10-CM | POA: Diagnosis not present

## 2020-04-02 DIAGNOSIS — D849 Immunodeficiency, unspecified: Secondary | ICD-10-CM | POA: Diagnosis not present

## 2020-04-02 DIAGNOSIS — Z79899 Other long term (current) drug therapy: Secondary | ICD-10-CM | POA: Diagnosis not present

## 2020-04-02 DIAGNOSIS — I130 Hypertensive heart and chronic kidney disease with heart failure and stage 1 through stage 4 chronic kidney disease, or unspecified chronic kidney disease: Secondary | ICD-10-CM | POA: Diagnosis not present

## 2020-04-02 DIAGNOSIS — Z7982 Long term (current) use of aspirin: Secondary | ICD-10-CM | POA: Diagnosis not present

## 2020-04-02 DIAGNOSIS — Z6837 Body mass index (BMI) 37.0-37.9, adult: Secondary | ICD-10-CM | POA: Diagnosis not present

## 2020-04-02 DIAGNOSIS — Z9981 Dependence on supplemental oxygen: Secondary | ICD-10-CM | POA: Diagnosis not present

## 2020-04-02 DIAGNOSIS — R0600 Dyspnea, unspecified: Secondary | ICD-10-CM | POA: Diagnosis not present

## 2020-04-02 DIAGNOSIS — I132 Hypertensive heart and chronic kidney disease with heart failure and with stage 5 chronic kidney disease, or end stage renal disease: Secondary | ICD-10-CM | POA: Diagnosis not present

## 2020-04-02 DIAGNOSIS — N1831 Anemia of chronic renal failure, stage 3a: Principal | ICD-10-CM

## 2020-04-02 DIAGNOSIS — I5032 Chronic diastolic (congestive) heart failure: Secondary | ICD-10-CM | POA: Diagnosis not present

## 2020-04-02 DIAGNOSIS — J449 Chronic obstructive pulmonary disease, unspecified: Secondary | ICD-10-CM | POA: Diagnosis not present

## 2020-04-02 DIAGNOSIS — G4733 Obstructive sleep apnea (adult) (pediatric): Secondary | ICD-10-CM | POA: Diagnosis not present

## 2020-04-02 DIAGNOSIS — R5383 Other fatigue: Secondary | ICD-10-CM | POA: Diagnosis not present

## 2020-04-02 DIAGNOSIS — D649 Anemia, unspecified: Secondary | ICD-10-CM | POA: Diagnosis not present

## 2020-04-02 DIAGNOSIS — Z792 Long term (current) use of antibiotics: Secondary | ICD-10-CM | POA: Diagnosis not present

## 2020-04-02 DIAGNOSIS — Z940 Kidney transplant status: Secondary | ICD-10-CM | POA: Diagnosis not present

## 2020-04-02 DIAGNOSIS — Z862 Personal history of diseases of the blood and blood-forming organs and certain disorders involving the immune mechanism: Secondary | ICD-10-CM | POA: Diagnosis not present

## 2020-04-02 DIAGNOSIS — N185 Chronic kidney disease, stage 5: Secondary | ICD-10-CM | POA: Diagnosis not present

## 2020-04-02 MED ORDER — PROGRAF 0.5 MG CAPSULE
ORAL_CAPSULE | 11 refills | 0 days | Status: CP
Start: 2020-04-02 — End: ?

## 2020-04-02 MED ORDER — ARANESP 200 MCG/0.4 ML (IN POLYSORBATE) INJECTION SYRINGE
Freq: Once | SUBCUTANEOUS | 11 refills | 0.00000 days | Status: CP | PRN
Start: 2020-04-02 — End: 2021-04-02

## 2020-04-02 MED ORDER — FUROSEMIDE 20 MG TABLET
ORAL_TABLET | Freq: Every day | ORAL | 11 refills | 30 days | Status: CP
Start: 2020-04-02 — End: 2021-04-02

## 2020-04-02 MED ORDER — HYDROXYUREA 500 MG CAPSULE
0 days
Start: 2020-04-02 — End: ?

## 2020-04-04 DIAGNOSIS — Z940 Kidney transplant status: Principal | ICD-10-CM

## 2020-04-04 MED ORDER — MYCOPHENOLATE SODIUM 180 MG TABLET,DELAYED RELEASE
ORAL_TABLET | Freq: Two times a day (BID) | ORAL | 11 refills | 30.00000 days | Status: CP
Start: 2020-04-04 — End: ?

## 2020-04-07 DIAGNOSIS — Z940 Kidney transplant status: Principal | ICD-10-CM

## 2020-04-07 MED ORDER — MYFORTIC 180 MG TABLET,DELAYED RELEASE
ORAL_TABLET | Freq: Two times a day (BID) | ORAL | 11 refills | 30.00000 days | Status: CP
Start: 2020-04-07 — End: 2021-04-07

## 2020-04-16 ENCOUNTER — Ambulatory Visit: Admit: 2020-04-16 | Discharge: 2020-04-17 | Payer: MEDICARE

## 2020-04-16 DIAGNOSIS — I5030 Unspecified diastolic (congestive) heart failure: Secondary | ICD-10-CM | POA: Diagnosis not present

## 2020-04-16 DIAGNOSIS — D631 Anemia in chronic kidney disease: Secondary | ICD-10-CM | POA: Diagnosis not present

## 2020-04-16 DIAGNOSIS — N182 Chronic kidney disease, stage 2 (mild): Secondary | ICD-10-CM | POA: Diagnosis not present

## 2020-04-16 DIAGNOSIS — Z79899 Other long term (current) drug therapy: Secondary | ICD-10-CM | POA: Diagnosis not present

## 2020-04-16 DIAGNOSIS — I272 Pulmonary hypertension, unspecified: Secondary | ICD-10-CM | POA: Diagnosis not present

## 2020-04-16 DIAGNOSIS — Z940 Kidney transplant status: Secondary | ICD-10-CM | POA: Diagnosis not present

## 2020-04-29 ENCOUNTER — Ambulatory Visit: Admit: 2020-04-29 | Discharge: 2020-04-30 | Payer: MEDICARE

## 2020-04-29 DIAGNOSIS — I5030 Unspecified diastolic (congestive) heart failure: Secondary | ICD-10-CM | POA: Diagnosis not present

## 2020-04-29 DIAGNOSIS — Z79899 Other long term (current) drug therapy: Secondary | ICD-10-CM | POA: Diagnosis not present

## 2020-04-29 DIAGNOSIS — I272 Pulmonary hypertension, unspecified: Secondary | ICD-10-CM | POA: Diagnosis not present

## 2020-04-29 DIAGNOSIS — Z940 Kidney transplant status: Secondary | ICD-10-CM | POA: Diagnosis not present

## 2020-04-29 DIAGNOSIS — N182 Chronic kidney disease, stage 2 (mild): Secondary | ICD-10-CM | POA: Diagnosis not present

## 2020-04-29 DIAGNOSIS — D631 Anemia in chronic kidney disease: Secondary | ICD-10-CM | POA: Diagnosis not present

## 2020-04-30 DIAGNOSIS — Z862 Personal history of diseases of the blood and blood-forming organs and certain disorders involving the immune mechanism: Principal | ICD-10-CM

## 2020-04-30 DIAGNOSIS — D849 Immunodeficiency, unspecified: Principal | ICD-10-CM

## 2020-04-30 DIAGNOSIS — Z940 Kidney transplant status: Principal | ICD-10-CM

## 2020-04-30 DIAGNOSIS — N189 Chronic kidney disease, unspecified: Principal | ICD-10-CM

## 2020-04-30 DIAGNOSIS — Z79899 Other long term (current) drug therapy: Principal | ICD-10-CM

## 2020-04-30 MED ORDER — ARANESP 200 MCG/0.4 ML (IN POLYSORBATE) INJECTION SYRINGE
SUBCUTANEOUS | 11 refills | 28.00000 days
Start: 2020-04-30 — End: 2021-04-30

## 2020-05-05 DIAGNOSIS — R5383 Other fatigue: Principal | ICD-10-CM

## 2020-05-05 DIAGNOSIS — D473 Essential (hemorrhagic) thrombocythemia: Principal | ICD-10-CM

## 2020-05-06 ENCOUNTER — Ambulatory Visit: Admit: 2020-05-06 | Discharge: 2020-05-07 | Payer: MEDICARE

## 2020-05-06 DIAGNOSIS — Z940 Kidney transplant status: Secondary | ICD-10-CM | POA: Diagnosis not present

## 2020-05-06 DIAGNOSIS — I151 Hypertension secondary to other renal disorders: Secondary | ICD-10-CM | POA: Diagnosis not present

## 2020-05-06 DIAGNOSIS — Z992 Dependence on renal dialysis: Secondary | ICD-10-CM | POA: Diagnosis not present

## 2020-05-06 DIAGNOSIS — N2889 Other specified disorders of kidney and ureter: Secondary | ICD-10-CM | POA: Diagnosis not present

## 2020-05-06 DIAGNOSIS — I272 Pulmonary hypertension, unspecified: Secondary | ICD-10-CM | POA: Diagnosis not present

## 2020-05-06 DIAGNOSIS — R030 Elevated blood-pressure reading, without diagnosis of hypertension: Secondary | ICD-10-CM | POA: Diagnosis not present

## 2020-05-06 DIAGNOSIS — N183 Anemia of chronic renal failure, stage 3 (moderate), unspecified whether stage 3a or 3b CKD (CMS-HCC): Principal | ICD-10-CM

## 2020-05-06 DIAGNOSIS — Z792 Long term (current) use of antibiotics: Secondary | ICD-10-CM | POA: Diagnosis not present

## 2020-05-06 DIAGNOSIS — Z7982 Long term (current) use of aspirin: Secondary | ICD-10-CM | POA: Diagnosis not present

## 2020-05-06 DIAGNOSIS — Z79899 Other long term (current) drug therapy: Secondary | ICD-10-CM | POA: Diagnosis not present

## 2020-05-06 DIAGNOSIS — R040 Epistaxis: Secondary | ICD-10-CM | POA: Diagnosis not present

## 2020-05-06 DIAGNOSIS — N186 End stage renal disease: Secondary | ICD-10-CM | POA: Diagnosis not present

## 2020-05-06 DIAGNOSIS — Z6838 Body mass index (BMI) 38.0-38.9, adult: Secondary | ICD-10-CM | POA: Diagnosis not present

## 2020-05-06 DIAGNOSIS — D631 Anemia in chronic kidney disease: Secondary | ICD-10-CM | POA: Diagnosis not present

## 2020-05-06 DIAGNOSIS — I5030 Unspecified diastolic (congestive) heart failure: Secondary | ICD-10-CM | POA: Diagnosis not present

## 2020-05-06 DIAGNOSIS — R5383 Other fatigue: Secondary | ICD-10-CM | POA: Diagnosis not present

## 2020-05-06 DIAGNOSIS — D473 Essential (hemorrhagic) thrombocythemia: Secondary | ICD-10-CM | POA: Diagnosis not present

## 2020-05-08 DIAGNOSIS — D473 Essential (hemorrhagic) thrombocythemia: Principal | ICD-10-CM

## 2020-05-08 MED ORDER — AMLODIPINE 5 MG TABLET
0.00000 days
Start: 2020-05-08 — End: 2020-07-09

## 2020-05-08 MED ORDER — CARVEDILOL 25 MG TABLET
ORAL_TABLET | 0 refills | 0 days
Start: 2020-05-08 — End: ?

## 2020-05-09 MED ORDER — LOSARTAN 50 MG TABLET
ORAL_TABLET | Freq: Every day | ORAL | 3 refills | 90.00000 days | Status: CP
Start: 2020-05-09 — End: ?

## 2020-05-09 MED ORDER — CARVEDILOL 25 MG TABLET
ORAL_TABLET | 11 refills | 0 days | Status: CP
Start: 2020-05-09 — End: ?

## 2020-05-11 ENCOUNTER — Ambulatory Visit
Admit: 2020-05-11 | Discharge: 2020-05-11 | Disposition: A | Discharge status: Home / Self Care | Payer: MEDICARE | Attending: Student in an Organized Health Care Education/Training Program

## 2020-05-11 ENCOUNTER — Emergency Department
Admit: 2020-05-11 | Discharge: 2020-05-11 | Disposition: A | Discharge status: Home / Self Care | Payer: MEDICARE | Attending: Student in an Organized Health Care Education/Training Program

## 2020-05-11 DIAGNOSIS — R1013 Epigastric pain: Secondary | ICD-10-CM | POA: Diagnosis not present

## 2020-05-11 DIAGNOSIS — I110 Hypertensive heart disease with heart failure: Secondary | ICD-10-CM | POA: Diagnosis not present

## 2020-05-11 DIAGNOSIS — R109 Unspecified abdominal pain: Secondary | ICD-10-CM | POA: Diagnosis not present

## 2020-05-11 DIAGNOSIS — Z940 Kidney transplant status: Secondary | ICD-10-CM | POA: Diagnosis not present

## 2020-05-11 DIAGNOSIS — Z6837 Body mass index (BMI) 37.0-37.9, adult: Secondary | ICD-10-CM | POA: Diagnosis not present

## 2020-05-11 DIAGNOSIS — K8020 Calculus of gallbladder without cholecystitis without obstruction: Secondary | ICD-10-CM | POA: Diagnosis not present

## 2020-05-11 DIAGNOSIS — C9000 Multiple myeloma not having achieved remission: Secondary | ICD-10-CM | POA: Diagnosis not present

## 2020-05-11 DIAGNOSIS — I509 Heart failure, unspecified: Secondary | ICD-10-CM | POA: Diagnosis not present

## 2020-05-11 DIAGNOSIS — R208 Other disturbances of skin sensation: Secondary | ICD-10-CM | POA: Diagnosis not present

## 2020-05-11 DIAGNOSIS — K8050 Calculus of bile duct without cholangitis or cholecystitis without obstruction: Secondary | ICD-10-CM | POA: Diagnosis not present

## 2020-05-11 DIAGNOSIS — R9431 Abnormal electrocardiogram [ECG] [EKG]: Secondary | ICD-10-CM | POA: Diagnosis not present

## 2020-05-11 DIAGNOSIS — K8080 Other cholelithiasis without obstruction: Principal | ICD-10-CM

## 2020-05-11 MED ORDER — OXYCODONE-ACETAMINOPHEN 5 MG-325 MG TABLET
ORAL_TABLET | ORAL | 0 refills | 1.00000 days | Status: CP | PRN
Start: 2020-05-11 — End: ?

## 2020-05-12 ENCOUNTER — Ambulatory Visit: Admit: 2020-05-12 | Payer: MEDICARE

## 2020-05-12 MED ORDER — TRAMADOL 50 MG TABLET
ORAL_TABLET | 1 refills | 0 days
Start: 2020-05-12 — End: ?

## 2020-05-13 DIAGNOSIS — D473 Essential (hemorrhagic) thrombocythemia: Principal | ICD-10-CM

## 2020-05-13 MED ORDER — HYDROXYUREA 500 MG CAPSULE
ORAL_CAPSULE | ORAL | 0 refills | 0.00000 days | Status: CP
Start: 2020-05-13 — End: 2020-05-14

## 2020-05-14 DIAGNOSIS — D631 Anemia in chronic kidney disease: Principal | ICD-10-CM

## 2020-05-14 DIAGNOSIS — N183 Anemia of chronic renal failure, stage 3 (moderate), unspecified whether stage 3a or 3b CKD (CMS-HCC): Principal | ICD-10-CM

## 2020-05-14 MED ORDER — HYDROXYUREA 500 MG CAPSULE
ORAL_CAPSULE | 0 refills | 0 days | Status: CP
Start: 2020-05-14 — End: 2020-08-15

## 2020-05-19 ENCOUNTER — Ambulatory Visit: Admit: 2020-05-19 | Discharge: 2020-05-20 | Payer: MEDICARE

## 2020-05-19 DIAGNOSIS — I272 Pulmonary hypertension, unspecified: Secondary | ICD-10-CM | POA: Diagnosis not present

## 2020-05-19 DIAGNOSIS — Z79899 Other long term (current) drug therapy: Secondary | ICD-10-CM | POA: Diagnosis not present

## 2020-05-19 DIAGNOSIS — N182 Chronic kidney disease, stage 2 (mild): Secondary | ICD-10-CM | POA: Diagnosis not present

## 2020-05-19 DIAGNOSIS — Z940 Kidney transplant status: Secondary | ICD-10-CM | POA: Diagnosis not present

## 2020-05-19 DIAGNOSIS — D631 Anemia in chronic kidney disease: Secondary | ICD-10-CM | POA: Diagnosis not present

## 2020-05-19 DIAGNOSIS — I5030 Unspecified diastolic (congestive) heart failure: Secondary | ICD-10-CM | POA: Diagnosis not present

## 2020-05-26 ENCOUNTER — Other Ambulatory Visit: Admit: 2020-05-26 | Discharge: 2020-05-27 | Payer: MEDICARE

## 2020-05-26 ENCOUNTER — Ambulatory Visit: Admit: 2020-05-26 | Discharge: 2020-05-27 | Payer: MEDICARE

## 2020-05-26 DIAGNOSIS — D473 Essential (hemorrhagic) thrombocythemia: Secondary | ICD-10-CM | POA: Diagnosis not present

## 2020-05-26 DIAGNOSIS — D631 Anemia in chronic kidney disease: Principal | ICD-10-CM

## 2020-05-26 DIAGNOSIS — N183 Anemia of chronic renal failure, stage 3 (moderate), unspecified whether stage 3a or 3b CKD (CMS-HCC): Principal | ICD-10-CM

## 2020-05-27 DIAGNOSIS — N183 Anemia of chronic renal failure, stage 3 (moderate), unspecified whether stage 3a or 3b CKD (CMS-HCC): Principal | ICD-10-CM

## 2020-05-27 DIAGNOSIS — D631 Anemia in chronic kidney disease: Principal | ICD-10-CM

## 2020-06-02 ENCOUNTER — Ambulatory Visit: Admit: 2020-06-02 | Discharge: 2020-06-03 | Payer: MEDICARE | Attending: Trauma Surgery | Primary: Trauma Surgery

## 2020-06-02 DIAGNOSIS — K8020 Calculus of gallbladder without cholecystitis without obstruction: Secondary | ICD-10-CM | POA: Diagnosis not present

## 2020-06-02 DIAGNOSIS — Z6837 Body mass index (BMI) 37.0-37.9, adult: Secondary | ICD-10-CM | POA: Diagnosis not present

## 2020-06-09 ENCOUNTER — Ambulatory Visit: Admit: 2020-06-09 | Discharge: 2020-06-10 | Payer: MEDICARE

## 2020-06-09 ENCOUNTER — Other Ambulatory Visit: Admit: 2020-06-09 | Discharge: 2020-06-10 | Payer: MEDICARE

## 2020-06-09 DIAGNOSIS — Z7982 Long term (current) use of aspirin: Secondary | ICD-10-CM | POA: Diagnosis not present

## 2020-06-09 DIAGNOSIS — D631 Anemia in chronic kidney disease: Secondary | ICD-10-CM | POA: Diagnosis not present

## 2020-06-09 DIAGNOSIS — Z792 Long term (current) use of antibiotics: Secondary | ICD-10-CM | POA: Diagnosis not present

## 2020-06-09 DIAGNOSIS — N2889 Other specified disorders of kidney and ureter: Secondary | ICD-10-CM | POA: Diagnosis not present

## 2020-06-09 DIAGNOSIS — I151 Hypertension secondary to other renal disorders: Secondary | ICD-10-CM | POA: Diagnosis not present

## 2020-06-09 DIAGNOSIS — N183 Chronic kidney disease, stage 3 unspecified: Secondary | ICD-10-CM | POA: Diagnosis not present

## 2020-06-12 DIAGNOSIS — D631 Anemia in chronic kidney disease: Principal | ICD-10-CM

## 2020-06-12 DIAGNOSIS — N183 Anemia of chronic renal failure, stage 3 (moderate), unspecified whether stage 3a or 3b CKD (CMS-HCC): Principal | ICD-10-CM

## 2020-06-23 ENCOUNTER — Ambulatory Visit: Admit: 2020-06-23 | Discharge: 2020-06-24 | Payer: MEDICARE

## 2020-06-23 DIAGNOSIS — D631 Anemia in chronic kidney disease: Secondary | ICD-10-CM | POA: Diagnosis not present

## 2020-06-23 DIAGNOSIS — I5030 Unspecified diastolic (congestive) heart failure: Secondary | ICD-10-CM | POA: Diagnosis not present

## 2020-06-23 DIAGNOSIS — Z79899 Other long term (current) drug therapy: Secondary | ICD-10-CM | POA: Diagnosis not present

## 2020-06-23 DIAGNOSIS — D849 Immunodeficiency, unspecified: Secondary | ICD-10-CM | POA: Diagnosis not present

## 2020-06-23 DIAGNOSIS — T8610 Unspecified complication of kidney transplant: Secondary | ICD-10-CM | POA: Diagnosis not present

## 2020-06-23 DIAGNOSIS — N182 Chronic kidney disease, stage 2 (mild): Secondary | ICD-10-CM | POA: Diagnosis not present

## 2020-06-23 DIAGNOSIS — Z940 Kidney transplant status: Secondary | ICD-10-CM | POA: Diagnosis not present

## 2020-06-23 DIAGNOSIS — I272 Pulmonary hypertension, unspecified: Secondary | ICD-10-CM | POA: Diagnosis not present

## 2020-06-25 DIAGNOSIS — Z940 Kidney transplant status: Principal | ICD-10-CM

## 2020-06-25 DIAGNOSIS — R779 Abnormality of plasma protein, unspecified: Principal | ICD-10-CM

## 2020-06-25 DIAGNOSIS — D849 Immunodeficiency, unspecified: Principal | ICD-10-CM

## 2020-06-25 DIAGNOSIS — Z79899 Other long term (current) drug therapy: Principal | ICD-10-CM

## 2020-06-25 DIAGNOSIS — C8590 Non-Hodgkin lymphoma, unspecified, unspecified site: Principal | ICD-10-CM

## 2020-06-27 DIAGNOSIS — N183 Anemia of chronic renal failure, stage 3 (moderate), unspecified whether stage 3a or 3b CKD (CMS-HCC): Principal | ICD-10-CM

## 2020-06-27 DIAGNOSIS — D631 Anemia in chronic kidney disease: Principal | ICD-10-CM

## 2020-07-03 DIAGNOSIS — Z79899 Other long term (current) drug therapy: Principal | ICD-10-CM

## 2020-07-03 DIAGNOSIS — Z940 Kidney transplant status: Principal | ICD-10-CM

## 2020-07-07 ENCOUNTER — Other Ambulatory Visit: Admit: 2020-07-07 | Discharge: 2020-07-08 | Payer: MEDICARE

## 2020-07-07 ENCOUNTER — Ambulatory Visit: Admit: 2020-07-07 | Discharge: 2020-07-08 | Payer: MEDICARE

## 2020-07-07 DIAGNOSIS — N183 Chronic kidney disease, stage 3 unspecified: Secondary | ICD-10-CM | POA: Diagnosis not present

## 2020-07-07 DIAGNOSIS — D631 Anemia in chronic kidney disease: Secondary | ICD-10-CM | POA: Diagnosis not present

## 2020-07-07 DIAGNOSIS — Z79899 Other long term (current) drug therapy: Secondary | ICD-10-CM | POA: Diagnosis not present

## 2020-07-07 DIAGNOSIS — Z940 Kidney transplant status: Secondary | ICD-10-CM | POA: Diagnosis not present

## 2020-07-09 ENCOUNTER — Ambulatory Visit: Admit: 2020-07-09 | Discharge: 2020-07-10 | Payer: MEDICARE | Attending: Nephrology | Primary: Nephrology

## 2020-07-09 DIAGNOSIS — Z6837 Body mass index (BMI) 37.0-37.9, adult: Secondary | ICD-10-CM | POA: Diagnosis not present

## 2020-07-09 DIAGNOSIS — D849 Immunodeficiency, unspecified: Secondary | ICD-10-CM | POA: Diagnosis not present

## 2020-07-09 DIAGNOSIS — Z7982 Long term (current) use of aspirin: Secondary | ICD-10-CM | POA: Diagnosis not present

## 2020-07-09 DIAGNOSIS — K8020 Calculus of gallbladder without cholecystitis without obstruction: Secondary | ICD-10-CM | POA: Diagnosis not present

## 2020-07-09 DIAGNOSIS — Z940 Kidney transplant status: Secondary | ICD-10-CM | POA: Diagnosis not present

## 2020-07-09 DIAGNOSIS — N186 End stage renal disease: Secondary | ICD-10-CM | POA: Diagnosis not present

## 2020-07-09 DIAGNOSIS — Z79899 Other long term (current) drug therapy: Secondary | ICD-10-CM | POA: Diagnosis not present

## 2020-07-09 DIAGNOSIS — I5032 Chronic diastolic (congestive) heart failure: Secondary | ICD-10-CM | POA: Diagnosis not present

## 2020-07-09 DIAGNOSIS — D473 Essential (hemorrhagic) thrombocythemia: Secondary | ICD-10-CM | POA: Diagnosis not present

## 2020-07-09 DIAGNOSIS — R0902 Hypoxemia: Secondary | ICD-10-CM | POA: Diagnosis not present

## 2020-07-09 DIAGNOSIS — Z48298 Encounter for aftercare following other organ transplant: Secondary | ICD-10-CM | POA: Diagnosis not present

## 2020-07-09 DIAGNOSIS — J449 Chronic obstructive pulmonary disease, unspecified: Secondary | ICD-10-CM | POA: Diagnosis not present

## 2020-07-09 DIAGNOSIS — Z23 Encounter for immunization: Secondary | ICD-10-CM | POA: Diagnosis not present

## 2020-07-09 DIAGNOSIS — Z8739 Personal history of other diseases of the musculoskeletal system and connective tissue: Secondary | ICD-10-CM | POA: Diagnosis not present

## 2020-07-09 DIAGNOSIS — D649 Anemia, unspecified: Secondary | ICD-10-CM | POA: Diagnosis not present

## 2020-07-09 DIAGNOSIS — D472 Monoclonal gammopathy: Secondary | ICD-10-CM | POA: Diagnosis not present

## 2020-07-09 MED ORDER — AMLODIPINE 5 MG TABLET
ORAL_TABLET | Freq: Every day | ORAL | 3 refills | 90 days | Status: CP
Start: 2020-07-09 — End: ?

## 2020-07-09 MED ORDER — SODIUM BICARBONATE 325 MG TABLET
ORAL_TABLET | 3 refills | 0 days | Status: CP
Start: 2020-07-09 — End: ?

## 2020-07-09 MED ORDER — COLCHICINE 0.6 MG TABLET
ORAL_TABLET | 0 refills | 0 days | Status: CP
Start: 2020-07-09 — End: ?

## 2020-07-14 DIAGNOSIS — Z940 Kidney transplant status: Principal | ICD-10-CM

## 2020-07-14 DIAGNOSIS — U071 COVID: Principal | ICD-10-CM

## 2020-07-15 DIAGNOSIS — D631 Anemia in chronic kidney disease: Principal | ICD-10-CM

## 2020-07-15 DIAGNOSIS — N183 Anemia of chronic renal failure, stage 3 (moderate), unspecified whether stage 3a or 3b CKD (CMS-HCC): Principal | ICD-10-CM

## 2020-08-01 ENCOUNTER — Ambulatory Visit: Admit: 2020-08-01 | Discharge: 2020-08-02 | Payer: MEDICARE

## 2020-08-01 DIAGNOSIS — I313 Pericardial effusion (noninflammatory): Principal | ICD-10-CM

## 2020-08-01 DIAGNOSIS — D849 Immunodeficiency, unspecified: Principal | ICD-10-CM

## 2020-08-01 DIAGNOSIS — R779 Abnormality of plasma protein, unspecified: Principal | ICD-10-CM

## 2020-08-01 DIAGNOSIS — C8590 Non-Hodgkin lymphoma, unspecified, unspecified site: Principal | ICD-10-CM

## 2020-08-01 DIAGNOSIS — Z79899 Other long term (current) drug therapy: Principal | ICD-10-CM

## 2020-08-01 DIAGNOSIS — Z09 Encounter for follow-up examination after completed treatment for conditions other than malignant neoplasm: Principal | ICD-10-CM

## 2020-08-01 DIAGNOSIS — R911 Solitary pulmonary nodule: Principal | ICD-10-CM

## 2020-08-01 DIAGNOSIS — I517 Cardiomegaly: Principal | ICD-10-CM

## 2020-08-01 DIAGNOSIS — Z940 Kidney transplant status: Principal | ICD-10-CM

## 2020-08-04 ENCOUNTER — Other Ambulatory Visit: Admit: 2020-08-04 | Discharge: 2020-08-05 | Payer: MEDICARE

## 2020-08-04 ENCOUNTER — Ambulatory Visit: Admit: 2020-08-04 | Discharge: 2020-08-05 | Payer: MEDICARE

## 2020-08-04 DIAGNOSIS — I151 Hypertension secondary to other renal disorders: Principal | ICD-10-CM

## 2020-08-04 DIAGNOSIS — Z940 Kidney transplant status: Principal | ICD-10-CM

## 2020-08-04 DIAGNOSIS — D631 Anemia in chronic kidney disease: Principal | ICD-10-CM

## 2020-08-04 DIAGNOSIS — I272 Pulmonary hypertension, unspecified: Principal | ICD-10-CM

## 2020-08-04 DIAGNOSIS — N2889 Other specified disorders of kidney and ureter: Principal | ICD-10-CM

## 2020-08-04 DIAGNOSIS — Z79899 Other long term (current) drug therapy: Principal | ICD-10-CM

## 2020-08-04 DIAGNOSIS — Z8739 Personal history of other diseases of the musculoskeletal system and connective tissue: Principal | ICD-10-CM

## 2020-08-04 DIAGNOSIS — T8610 Unspecified complication of kidney transplant: Principal | ICD-10-CM

## 2020-08-04 DIAGNOSIS — N183 Anemia of chronic renal failure, stage 3 (moderate), unspecified whether stage 3a or 3b CKD (CMS-HCC): Principal | ICD-10-CM

## 2020-08-04 DIAGNOSIS — D849 Immunodeficiency, unspecified: Principal | ICD-10-CM

## 2020-08-04 DIAGNOSIS — I5030 Unspecified diastolic (congestive) heart failure: Principal | ICD-10-CM

## 2020-08-04 DIAGNOSIS — N182 Chronic kidney disease, stage 2 (mild): Principal | ICD-10-CM

## 2020-08-04 MED ORDER — EPLERENONE 25 MG TABLET
ORAL_TABLET | 0 refills | 0 days
Start: 2020-08-04 — End: ?

## 2020-08-04 MED ORDER — COLCHICINE 0.6 MG TABLET
ORAL_TABLET | 0 refills | 0 days
Start: 2020-08-04 — End: ?

## 2020-08-06 DIAGNOSIS — D75839 Thrombocythemia: Principal | ICD-10-CM

## 2020-08-11 DIAGNOSIS — Z940 Kidney transplant status: Principal | ICD-10-CM

## 2020-08-13 DIAGNOSIS — J449 Chronic obstructive pulmonary disease, unspecified: Principal | ICD-10-CM

## 2020-08-13 DIAGNOSIS — I5030 Unspecified diastolic (congestive) heart failure: Principal | ICD-10-CM

## 2020-08-13 DIAGNOSIS — Z23 Encounter for immunization: Principal | ICD-10-CM

## 2020-08-13 DIAGNOSIS — I151 Hypertension secondary to other renal disorders: Principal | ICD-10-CM

## 2020-08-13 DIAGNOSIS — R911 Solitary pulmonary nodule: Principal | ICD-10-CM

## 2020-08-13 DIAGNOSIS — G4733 Obstructive sleep apnea (adult) (pediatric): Principal | ICD-10-CM

## 2020-08-13 DIAGNOSIS — Z9989 Dependence on other enabling machines and devices: Principal | ICD-10-CM

## 2020-08-13 DIAGNOSIS — N2889 Other specified disorders of kidney and ureter: Principal | ICD-10-CM

## 2020-08-13 DIAGNOSIS — R7309 Other abnormal glucose: Principal | ICD-10-CM

## 2020-08-13 DIAGNOSIS — Z940 Kidney transplant status: Principal | ICD-10-CM

## 2020-08-13 MED ORDER — EPLERENONE 25 MG TABLET
ORAL_TABLET | Freq: Every day | ORAL | 3 refills | 90 days | Status: CP
Start: 2020-08-13 — End: 2020-08-15

## 2020-08-15 ENCOUNTER — Ambulatory Visit: Admit: 2020-08-15 | Discharge: 2020-08-15 | Payer: MEDICARE

## 2020-08-15 DIAGNOSIS — Z9989 Dependence on other enabling machines and devices: Principal | ICD-10-CM

## 2020-08-15 DIAGNOSIS — G4733 Obstructive sleep apnea (adult) (pediatric): Principal | ICD-10-CM

## 2020-08-15 DIAGNOSIS — Z79899 Other long term (current) drug therapy: Principal | ICD-10-CM

## 2020-08-15 DIAGNOSIS — D849 Immunodeficiency, unspecified: Principal | ICD-10-CM

## 2020-08-15 DIAGNOSIS — N182 Chronic kidney disease, stage 2 (mild): Principal | ICD-10-CM

## 2020-08-15 DIAGNOSIS — J449 Chronic obstructive pulmonary disease, unspecified: Principal | ICD-10-CM

## 2020-08-15 DIAGNOSIS — D631 Anemia in chronic kidney disease: Principal | ICD-10-CM

## 2020-08-15 DIAGNOSIS — I151 Hypertension secondary to other renal disorders: Principal | ICD-10-CM

## 2020-08-15 DIAGNOSIS — N183 Anemia of chronic renal failure, stage 3 (moderate), unspecified whether stage 3a or 3b CKD (CMS-HCC): Principal | ICD-10-CM

## 2020-08-15 DIAGNOSIS — I5030 Unspecified diastolic (congestive) heart failure: Principal | ICD-10-CM

## 2020-08-15 DIAGNOSIS — I272 Pulmonary hypertension, unspecified: Principal | ICD-10-CM

## 2020-08-15 DIAGNOSIS — T8610 Unspecified complication of kidney transplant: Principal | ICD-10-CM

## 2020-08-15 DIAGNOSIS — Z940 Kidney transplant status: Principal | ICD-10-CM

## 2020-08-15 DIAGNOSIS — R7309 Other abnormal glucose: Principal | ICD-10-CM

## 2020-08-15 DIAGNOSIS — N2889 Other specified disorders of kidney and ureter: Principal | ICD-10-CM

## 2020-08-15 MED ORDER — EPLERENONE 25 MG TABLET
ORAL_TABLET | Freq: Every day | ORAL | 3 refills | 90 days | Status: CP
Start: 2020-08-15 — End: ?

## 2020-08-15 MED ORDER — EMPAGLIFLOZIN 10 MG TABLET
ORAL_TABLET | Freq: Every day | ORAL | 6 refills | 30.00000 days | Status: CP
Start: 2020-08-15 — End: 2020-08-18

## 2020-08-18 ENCOUNTER — Other Ambulatory Visit: Admit: 2020-08-18 | Discharge: 2020-08-19 | Payer: MEDICARE

## 2020-08-18 ENCOUNTER — Ambulatory Visit: Admit: 2020-08-18 | Discharge: 2020-08-19 | Payer: MEDICARE

## 2020-08-18 DIAGNOSIS — Z940 Kidney transplant status: Principal | ICD-10-CM

## 2020-08-18 DIAGNOSIS — I5030 Unspecified diastolic (congestive) heart failure: Principal | ICD-10-CM

## 2020-08-18 DIAGNOSIS — N182 Chronic kidney disease, stage 2 (mild): Principal | ICD-10-CM

## 2020-08-18 DIAGNOSIS — I272 Pulmonary hypertension, unspecified: Principal | ICD-10-CM

## 2020-08-18 DIAGNOSIS — D631 Anemia in chronic kidney disease: Principal | ICD-10-CM

## 2020-08-18 DIAGNOSIS — N183 Chronic kidney disease, stage 3 unspecified: Principal | ICD-10-CM

## 2020-08-18 DIAGNOSIS — Z79899 Other long term (current) drug therapy: Principal | ICD-10-CM

## 2020-08-18 MED ORDER — METFORMIN 500 MG TABLET
ORAL_TABLET | Freq: Two times a day (BID) | ORAL | 3 refills | 90 days | Status: CP
Start: 2020-08-18 — End: 2021-08-18

## 2020-08-22 DIAGNOSIS — N183 Anemia of chronic renal failure, stage 3 (moderate), unspecified whether stage 3a or 3b CKD (CMS-HCC): Principal | ICD-10-CM

## 2020-08-22 DIAGNOSIS — D631 Anemia in chronic kidney disease: Principal | ICD-10-CM

## 2020-08-25 DIAGNOSIS — D539 Nutritional anemia, unspecified: Principal | ICD-10-CM

## 2020-08-26 ENCOUNTER — Ambulatory Visit: Admit: 2020-08-26 | Discharge: 2020-08-27 | Payer: MEDICARE

## 2020-08-26 DIAGNOSIS — D539 Nutritional anemia, unspecified: Principal | ICD-10-CM

## 2020-08-26 DIAGNOSIS — D473 Essential (hemorrhagic) thrombocythemia: Principal | ICD-10-CM

## 2020-08-26 DIAGNOSIS — E538 Deficiency of other specified B group vitamins: Principal | ICD-10-CM

## 2020-08-26 MED ORDER — CYANOCOBALAMIN (VIT B-12) 1,000 MCG/ML INJECTION SOLUTION
SUBCUTANEOUS | 0 refills | 28.00000 days | Status: CP
Start: 2020-08-26 — End: 2020-09-17

## 2020-09-02 ENCOUNTER — Ambulatory Visit: Admit: 2020-09-02 | Discharge: 2020-09-03 | Payer: MEDICARE

## 2020-09-02 DIAGNOSIS — R911 Solitary pulmonary nodule: Principal | ICD-10-CM

## 2020-09-03 ENCOUNTER — Ambulatory Visit: Admit: 2020-09-03 | Discharge: 2020-09-04 | Payer: MEDICARE

## 2020-09-05 ENCOUNTER — Ambulatory Visit: Admit: 2020-09-05 | Discharge: 2020-09-06 | Payer: MEDICARE

## 2020-09-10 DIAGNOSIS — D631 Anemia in chronic kidney disease: Principal | ICD-10-CM

## 2020-09-10 DIAGNOSIS — N183 Anemia of chronic renal failure, stage 3 (moderate), unspecified whether stage 3a or 3b CKD (CMS-HCC): Principal | ICD-10-CM

## 2020-09-15 ENCOUNTER — Other Ambulatory Visit: Admit: 2020-09-15 | Discharge: 2020-09-16 | Payer: MEDICARE

## 2020-09-15 ENCOUNTER — Ambulatory Visit: Admit: 2020-09-15 | Discharge: 2020-09-16 | Payer: MEDICARE

## 2020-09-15 DIAGNOSIS — T8610 Unspecified complication of kidney transplant: Principal | ICD-10-CM

## 2020-09-15 DIAGNOSIS — N183 Anemia of chronic renal failure, stage 3 (moderate), unspecified whether stage 3a or 3b CKD (CMS-HCC): Principal | ICD-10-CM

## 2020-09-15 DIAGNOSIS — I5030 Unspecified diastolic (congestive) heart failure: Principal | ICD-10-CM

## 2020-09-15 DIAGNOSIS — D849 Immunodeficiency, unspecified: Principal | ICD-10-CM

## 2020-09-15 DIAGNOSIS — Z940 Kidney transplant status: Principal | ICD-10-CM

## 2020-09-15 DIAGNOSIS — D631 Anemia in chronic kidney disease: Principal | ICD-10-CM

## 2020-09-15 DIAGNOSIS — I272 Pulmonary hypertension, unspecified: Principal | ICD-10-CM

## 2020-09-15 DIAGNOSIS — Z79899 Other long term (current) drug therapy: Principal | ICD-10-CM

## 2020-09-18 ENCOUNTER — Ambulatory Visit: Admit: 2020-09-18 | Discharge: 2020-09-19 | Payer: MEDICARE

## 2020-09-18 DIAGNOSIS — Z0000 Encounter for general adult medical examination without abnormal findings: Principal | ICD-10-CM

## 2020-09-18 DIAGNOSIS — Z1231 Encounter for screening mammogram for malignant neoplasm of breast: Principal | ICD-10-CM

## 2020-09-18 DIAGNOSIS — Z780 Asymptomatic menopausal state: Principal | ICD-10-CM

## 2020-09-18 DIAGNOSIS — R0600 Dyspnea, unspecified: Principal | ICD-10-CM

## 2020-09-18 DIAGNOSIS — D649 Anemia, unspecified: Principal | ICD-10-CM

## 2020-09-29 ENCOUNTER — Other Ambulatory Visit: Admit: 2020-09-29 | Discharge: 2020-09-29 | Payer: MEDICARE

## 2020-09-29 ENCOUNTER — Ambulatory Visit: Admit: 2020-09-29 | Discharge: 2020-09-29 | Payer: MEDICARE

## 2020-09-29 ENCOUNTER — Ambulatory Visit: Admit: 2020-09-29 | Discharge: 2020-09-29 | Payer: MEDICARE | Attending: Internal Medicine | Primary: Internal Medicine

## 2020-09-29 DIAGNOSIS — N183 Anemia of chronic renal failure, stage 3 (moderate), unspecified whether stage 3a or 3b CKD (CMS-HCC): Principal | ICD-10-CM

## 2020-09-29 DIAGNOSIS — D473 Essential (hemorrhagic) thrombocythemia: Principal | ICD-10-CM

## 2020-09-29 DIAGNOSIS — D649 Anemia, unspecified: Principal | ICD-10-CM

## 2020-09-29 DIAGNOSIS — D631 Anemia in chronic kidney disease: Principal | ICD-10-CM

## 2020-09-29 DIAGNOSIS — D75839 Thrombocythemia: Principal | ICD-10-CM

## 2020-10-02 DIAGNOSIS — Z940 Kidney transplant status: Principal | ICD-10-CM

## 2020-10-02 DIAGNOSIS — Z79899 Other long term (current) drug therapy: Principal | ICD-10-CM

## 2020-10-03 ENCOUNTER — Ambulatory Visit: Admit: 2020-10-03 | Discharge: 2020-10-03 | Payer: MEDICARE | Attending: Nephrology | Primary: Nephrology

## 2020-10-03 ENCOUNTER — Ambulatory Visit: Admit: 2020-10-03 | Discharge: 2020-10-03 | Payer: MEDICARE

## 2020-10-03 DIAGNOSIS — I5030 Unspecified diastolic (congestive) heart failure: Principal | ICD-10-CM

## 2020-10-03 DIAGNOSIS — N2889 Other specified disorders of kidney and ureter: Principal | ICD-10-CM

## 2020-10-03 DIAGNOSIS — Z79899 Other long term (current) drug therapy: Principal | ICD-10-CM

## 2020-10-03 DIAGNOSIS — A09 Infectious gastroenteritis and colitis, unspecified: Principal | ICD-10-CM

## 2020-10-03 DIAGNOSIS — J449 Chronic obstructive pulmonary disease, unspecified: Principal | ICD-10-CM

## 2020-10-03 DIAGNOSIS — Z940 Kidney transplant status: Principal | ICD-10-CM

## 2020-10-03 DIAGNOSIS — R0600 Dyspnea, unspecified: Principal | ICD-10-CM

## 2020-10-03 DIAGNOSIS — Z8739 Personal history of other diseases of the musculoskeletal system and connective tissue: Principal | ICD-10-CM

## 2020-10-03 DIAGNOSIS — Z9989 Dependence on other enabling machines and devices: Principal | ICD-10-CM

## 2020-10-03 DIAGNOSIS — Z862 Personal history of diseases of the blood and blood-forming organs and certain disorders involving the immune mechanism: Principal | ICD-10-CM

## 2020-10-03 DIAGNOSIS — I10 Essential (primary) hypertension: Principal | ICD-10-CM

## 2020-10-03 DIAGNOSIS — R059 Cough: Principal | ICD-10-CM

## 2020-10-03 DIAGNOSIS — G4733 Obstructive sleep apnea (adult) (pediatric): Principal | ICD-10-CM

## 2020-10-03 DIAGNOSIS — N189 Chronic kidney disease, unspecified: Principal | ICD-10-CM

## 2020-10-03 DIAGNOSIS — D849 Immunodeficiency, unspecified: Principal | ICD-10-CM

## 2020-10-03 DIAGNOSIS — I151 Hypertension secondary to other renal disorders: Principal | ICD-10-CM

## 2020-10-03 MED ORDER — ARANESP 200 MCG/0.4 ML (IN POLYSORBATE) INJECTION SYRINGE
SUBCUTANEOUS | 11 refills | 28 days | Status: CP
Start: 2020-10-03 — End: 2021-10-03

## 2020-10-03 MED ORDER — EPLERENONE 25 MG TABLET
ORAL_TABLET | Freq: Every day | ORAL | 3 refills | 90 days | Status: CP
Start: 2020-10-03 — End: ?

## 2020-10-03 MED ORDER — CEPHALEXIN 250 MG CAPSULE
ORAL_CAPSULE | Freq: Every day | ORAL | 3 refills | 90 days | Status: CP
Start: 2020-10-03 — End: ?

## 2020-10-03 MED ORDER — AZELASTINE 137 MCG (0.1 %) NASAL SPRAY AEROSOL
Freq: Two times a day (BID) | NASAL | 11 refills | 0 days | Status: CP | PRN
Start: 2020-10-03 — End: 2021-10-03

## 2020-10-03 MED ORDER — METFORMIN 500 MG TABLET
ORAL_TABLET | Freq: Two times a day (BID) | ORAL | 3 refills | 90 days | Status: CP
Start: 2020-10-03 — End: 2021-10-03

## 2020-10-03 MED ORDER — AMLODIPINE 5 MG TABLET
ORAL_TABLET | Freq: Every day | ORAL | 3 refills | 90.00000 days | Status: CP
Start: 2020-10-03 — End: ?

## 2020-10-03 MED ORDER — LOSARTAN 50 MG TABLET
ORAL_TABLET | Freq: Every day | ORAL | 3 refills | 90 days | Status: CP
Start: 2020-10-03 — End: ?

## 2020-10-03 MED ORDER — PANTOPRAZOLE 40 MG TABLET,DELAYED RELEASE
ORAL_TABLET | Freq: Every day | ORAL | 3 refills | 90.00000 days | Status: CP
Start: 2020-10-03 — End: 2021-10-03

## 2020-10-03 MED ORDER — CARVEDILOL 25 MG TABLET
ORAL_TABLET | Freq: Two times a day (BID) | ORAL | 11 refills | 90.00000 days | Status: CP
Start: 2020-10-03 — End: ?

## 2020-10-03 MED ORDER — SODIUM BICARBONATE 325 MG TABLET
ORAL_TABLET | 3 refills | 0.00000 days | Status: CP
Start: 2020-10-03 — End: ?

## 2020-10-03 MED ORDER — CHOLECALCIFEROL (VITAMIN D3) 25 MCG (1,000 UNIT) TABLET
ORAL_TABLET | Freq: Every day | ORAL | 3 refills | 90 days | Status: CP
Start: 2020-10-03 — End: ?

## 2020-10-03 MED ORDER — COLCHICINE 0.6 MG TABLET
ORAL_TABLET | 0 refills | 0 days | Status: CP
Start: 2020-10-03 — End: ?

## 2020-10-03 MED ORDER — FLUTICASONE PROPIONATE 50 MCG/ACTUATION NASAL SPRAY,SUSPENSION
Freq: Two times a day (BID) | NASAL | 11 refills | 0 days | Status: CP | PRN
Start: 2020-10-03 — End: 2021-04-21

## 2020-10-03 MED ORDER — INCRUSE ELLIPTA 62.5 MCG/ACTUATION POWDER FOR INHALATION
Freq: Every day | RESPIRATORY_TRACT | 11 refills | 1 days | Status: CP
Start: 2020-10-03 — End: ?

## 2020-10-03 MED ORDER — FUROSEMIDE 20 MG TABLET
ORAL_TABLET | Freq: Every day | ORAL | 3 refills | 90.00000 days | Status: CP
Start: 2020-10-03 — End: 2021-10-03

## 2020-10-03 MED ORDER — PRAVASTATIN 40 MG TABLET
ORAL_TABLET | Freq: Every day | ORAL | 3 refills | 90 days | Status: CP
Start: 2020-10-03 — End: 2021-09-28

## 2020-10-07 ENCOUNTER — Ambulatory Visit: Admit: 2020-10-07 | Discharge: 2020-10-08 | Payer: MEDICARE

## 2020-10-07 DIAGNOSIS — D693 Immune thrombocytopenic purpura: Principal | ICD-10-CM

## 2020-10-07 DIAGNOSIS — R911 Solitary pulmonary nodule: Principal | ICD-10-CM

## 2020-10-07 DIAGNOSIS — D649 Anemia, unspecified: Principal | ICD-10-CM

## 2020-10-07 DIAGNOSIS — D61818 Other pancytopenia: Principal | ICD-10-CM

## 2020-10-07 DIAGNOSIS — Z862 Personal history of diseases of the blood and blood-forming organs and certain disorders involving the immune mechanism: Principal | ICD-10-CM

## 2020-10-08 ENCOUNTER — Ambulatory Visit: Admit: 2020-10-08 | Discharge: 2020-10-09 | Payer: MEDICARE

## 2020-10-08 ENCOUNTER — Other Ambulatory Visit: Admit: 2020-10-08 | Discharge: 2020-10-09 | Payer: MEDICARE

## 2020-10-08 DIAGNOSIS — N183 Anemia of chronic renal failure, stage 3 (moderate), unspecified whether stage 3a or 3b CKD (CMS-HCC): Principal | ICD-10-CM

## 2020-10-08 DIAGNOSIS — D631 Anemia in chronic kidney disease: Principal | ICD-10-CM

## 2020-10-08 DIAGNOSIS — D693 Immune thrombocytopenic purpura: Principal | ICD-10-CM

## 2020-10-08 DIAGNOSIS — Z940 Kidney transplant status: Principal | ICD-10-CM

## 2020-10-08 DIAGNOSIS — D649 Anemia, unspecified: Principal | ICD-10-CM

## 2020-10-08 DIAGNOSIS — Z862 Personal history of diseases of the blood and blood-forming organs and certain disorders involving the immune mechanism: Principal | ICD-10-CM

## 2020-10-08 DIAGNOSIS — Z79899 Other long term (current) drug therapy: Principal | ICD-10-CM

## 2020-10-13 DIAGNOSIS — D473 Essential (hemorrhagic) thrombocythemia: Principal | ICD-10-CM

## 2020-10-14 ENCOUNTER — Ambulatory Visit: Admit: 2020-10-14 | Discharge: 2020-10-15 | Payer: MEDICARE

## 2020-10-14 DIAGNOSIS — N183 Anemia of chronic renal failure, stage 3 (moderate), unspecified whether stage 3a or 3b CKD (CMS-HCC): Principal | ICD-10-CM

## 2020-10-14 DIAGNOSIS — D631 Anemia in chronic kidney disease: Principal | ICD-10-CM

## 2020-10-14 DIAGNOSIS — D473 Essential (hemorrhagic) thrombocythemia: Principal | ICD-10-CM

## 2020-10-21 ENCOUNTER — Telehealth: Admit: 2020-10-21 | Discharge: 2020-10-22 | Payer: MEDICARE | Attending: Adult Health | Primary: Adult Health

## 2020-10-21 DIAGNOSIS — D473 Essential (hemorrhagic) thrombocythemia: Principal | ICD-10-CM

## 2020-10-25 DIAGNOSIS — D473 Essential (hemorrhagic) thrombocythemia: Principal | ICD-10-CM

## 2020-10-28 MED ORDER — HYDROXYUREA 500 MG CAPSULE
ORAL_CAPSULE | 0 refills | 0 days | Status: CP
Start: 2020-10-28 — End: ?

## 2020-11-11 ENCOUNTER — Ambulatory Visit: Admit: 2020-11-11 | Discharge: 2020-11-12 | Payer: MEDICARE

## 2020-11-11 ENCOUNTER — Institutional Professional Consult (permissible substitution): Admit: 2020-11-11 | Discharge: 2020-11-12 | Payer: MEDICARE

## 2020-11-11 DIAGNOSIS — Z940 Kidney transplant status: Principal | ICD-10-CM

## 2020-11-11 DIAGNOSIS — D849 Immunodeficiency, unspecified: Principal | ICD-10-CM

## 2020-11-11 DIAGNOSIS — T8610 Unspecified complication of kidney transplant: Principal | ICD-10-CM

## 2020-11-11 DIAGNOSIS — Z79899 Other long term (current) drug therapy: Principal | ICD-10-CM

## 2020-11-11 DIAGNOSIS — N183 Anemia of chronic renal failure, stage 3 (moderate), unspecified whether stage 3a or 3b CKD (CMS-HCC): Principal | ICD-10-CM

## 2020-11-11 DIAGNOSIS — D631 Anemia in chronic kidney disease: Principal | ICD-10-CM

## 2020-11-20 DIAGNOSIS — Z940 Kidney transplant status: Principal | ICD-10-CM

## 2020-12-12 ENCOUNTER — Encounter: Payer: Self-pay | Admitting: Emergency Medicine

## 2020-12-12 ENCOUNTER — Ambulatory Visit (INDEPENDENT_AMBULATORY_CARE_PROVIDER_SITE_OTHER): Payer: Medicare Other

## 2020-12-12 ENCOUNTER — Ambulatory Visit
Admission: EM | Admit: 2020-12-12 | Discharge: 2020-12-12 | Disposition: A | Discharge status: Home / Self Care | Payer: Medicare Other | Attending: Emergency Medicine | Admitting: Emergency Medicine

## 2020-12-12 ENCOUNTER — Other Ambulatory Visit: Payer: Self-pay

## 2020-12-12 DIAGNOSIS — R0600 Dyspnea, unspecified: Secondary | ICD-10-CM | POA: Insufficient documentation

## 2020-12-12 DIAGNOSIS — Z940 Kidney transplant status: Secondary | ICD-10-CM | POA: Insufficient documentation

## 2020-12-12 DIAGNOSIS — Z881 Allergy status to other antibiotic agents status: Secondary | ICD-10-CM | POA: Insufficient documentation

## 2020-12-12 DIAGNOSIS — Z9981 Dependence on supplemental oxygen: Secondary | ICD-10-CM | POA: Insufficient documentation

## 2020-12-12 DIAGNOSIS — R0609 Other forms of dyspnea: Secondary | ICD-10-CM

## 2020-12-12 DIAGNOSIS — Z7982 Long term (current) use of aspirin: Secondary | ICD-10-CM | POA: Diagnosis not present

## 2020-12-12 DIAGNOSIS — Z79899 Other long term (current) drug therapy: Secondary | ICD-10-CM | POA: Insufficient documentation

## 2020-12-12 DIAGNOSIS — Z20822 Contact with and (suspected) exposure to covid-19: Secondary | ICD-10-CM | POA: Diagnosis not present

## 2020-12-12 DIAGNOSIS — Z7984 Long term (current) use of oral hypoglycemic drugs: Secondary | ICD-10-CM | POA: Diagnosis not present

## 2020-12-12 DIAGNOSIS — R059 Cough, unspecified: Secondary | ICD-10-CM | POA: Diagnosis not present

## 2020-12-12 DIAGNOSIS — R0602 Shortness of breath: Secondary | ICD-10-CM | POA: Insufficient documentation

## 2020-12-12 DIAGNOSIS — R052 Subacute cough: Secondary | ICD-10-CM | POA: Insufficient documentation

## 2020-12-12 DIAGNOSIS — Z87891 Personal history of nicotine dependence: Secondary | ICD-10-CM | POA: Diagnosis not present

## 2020-12-12 LAB — CBC WITH DIFFERENTIAL/PLATELET
Abs Immature Granulocytes: 0.66 10*3/uL — ABNORMAL HIGH (ref 0.00–0.07)
Basophils Absolute: 0 10*3/uL (ref 0.0–0.1)
Basophils Relative: 0 %
Eosinophils Absolute: 0 10*3/uL (ref 0.0–0.5)
Eosinophils Relative: 0 %
HCT: 24.9 % — ABNORMAL LOW (ref 36.0–46.0)
Hemoglobin: 8.3 g/dL — ABNORMAL LOW (ref 12.0–15.0)
Immature Granulocytes: 8 %
Lymphocytes Relative: 3 %
Lymphs Abs: 0.3 10*3/uL — ABNORMAL LOW (ref 0.7–4.0)
MCH: 42.8 pg — ABNORMAL HIGH (ref 26.0–34.0)
MCHC: 33.3 g/dL (ref 30.0–36.0)
MCV: 128.4 fL — ABNORMAL HIGH (ref 80.0–100.0)
Monocytes Absolute: 0.7 10*3/uL (ref 0.1–1.0)
Monocytes Relative: 8 %
Neutro Abs: 7.2 10*3/uL (ref 1.7–7.7)
Neutrophils Relative %: 81 %
Platelets: 586 10*3/uL — ABNORMAL HIGH (ref 150–400)
RBC: 1.94 MIL/uL — ABNORMAL LOW (ref 3.87–5.11)
RDW: 15 % (ref 11.5–15.5)
WBC: 8.8 10*3/uL (ref 4.0–10.5)
nRBC: 0.3 % — ABNORMAL HIGH (ref 0.0–0.2)

## 2020-12-12 LAB — COMPREHENSIVE METABOLIC PANEL
ALT: 11 U/L (ref 0–44)
AST: 18 U/L (ref 15–41)
Albumin: 3.6 g/dL (ref 3.5–5.0)
Alkaline Phosphatase: 45 U/L (ref 38–126)
Anion gap: 11 (ref 5–15)
BUN: 55 mg/dL — ABNORMAL HIGH (ref 8–23)
CO2: 21 mmol/L — ABNORMAL LOW (ref 22–32)
Calcium: 9.2 mg/dL (ref 8.9–10.3)
Chloride: 100 mmol/L (ref 98–111)
Creatinine, Ser: 1.75 mg/dL — ABNORMAL HIGH (ref 0.44–1.00)
GFR, Estimated: 31 mL/min — ABNORMAL LOW (ref >60)
Glucose, Bld: 168 mg/dL — ABNORMAL HIGH (ref 70–99)
Potassium: 4 mmol/L (ref 3.5–5.1)
Sodium: 132 mmol/L — ABNORMAL LOW (ref 135–145)
Total Bilirubin: 1.2 mg/dL (ref 0.3–1.2)
Total Protein: 6.9 g/dL (ref 6.5–8.1)

## 2020-12-12 LAB — RESP PANEL BY RT-PCR (FLU A&B, COVID) ARPGX2
Influenza A by PCR: NEGATIVE
Influenza B by PCR: NEGATIVE
SARS Coronavirus 2 by RT PCR: NEGATIVE

## 2020-12-12 MED ORDER — PROMETHAZINE-DM 6.25-15 MG/5ML PO SYRP: 5.0000 mL | ORAL_SOLUTION | Freq: Four times a day (QID) | ORAL | 0 refills | Status: AC | PRN

## 2020-12-12 MED ORDER — BENZONATATE 100 MG PO CAPS: 200.0000 mg | ORAL_CAPSULE | Freq: Three times a day (TID) | ORAL | 0 refills | Status: AC

## 2020-12-12 NOTE — ED Provider Notes (Signed)
[facility]    CSN: [id number] Arrival date & time: 12/12/20  1029      History   Chief Complaint Chief Complaint  Patient presents with  . Cough    HPI Diane Guerra is a 70 y.o. female.   HPI   70 year old female here for evaluation of respiratory complaints.  Patient reports that she has been dealing with nasal congestion, chest congestion, and cough for the past 2 weeks.  She did have a fever up to 100.2 on days 1 through 3 and then the fever returned this morning also to 100.2.  She had clear nasal discharge and a productive cough for clear sputum.  She is also complaining of ear pressure, sore throat which has resolved, and shortness of breath with exertion.  Patient is on oxygen via nasal cannula at 2 L/min.  Prior to developing her respiratory symptoms she did not need oxygen to move around.  She denies wheezing, chest pain, swelling in her legs, or known sick contacts.  Past Medical History:  Diagnosis Date  . Anemia   . Arthritis    knees  . CHF (congestive heart failure) ([place])   . Chronic kidney disease 2007   followed by Dr Juanito Doom  . Chronic UTI   . Essential thrombocytosis ([place])   . Gout   . Heart murmur   . Hypertension   . Kidney failure 2014  . Pulmonary edema 2014  . Sleep apnea with use of continuous positive airway pressure (CPAP)     Patient Active Problem List   Diagnosis Date Noted  . Hypercholesteremia 09/30/2017  . SOB (shortness of breath) 08/03/2015  . Edema, lower extremity 09/02/2014  . Cough 09/02/2014  . Health care maintenance 09/02/2014  . Pain of fifth toe 04/10/2014  . Knee pain 10/14/2013  . Pulmonary edema 01/07/2013  . Chronic kidney disease 01/07/2013  . Anemia in chronic kidney disease 01/07/2013  . Essential thrombocytosis ([place]) 01/07/2013  . Obstructive sleep apnea 01/07/2013  . Gout 01/07/2013  . AVM (arteriovenous malformation) 01/07/2013    Past Surgical History:  Procedure Laterality Date  .  BREAST BIOPSY Left 1986   neg  . CATARACT EXTRACTION W/PHACO Right 08/16/2016   Procedure: CATARACT EXTRACTION PHACO AND INTRAOCULAR LENS PLACEMENT ([place])  Right eye;  Surgeon: Ronnell Freshwater, MD;  Location: [place];  Service: Ophthalmology;  Laterality: Right;  sleep apnea  . CATARACT EXTRACTION W/PHACO Left 08/30/2016   Procedure: CATARACT EXTRACTION PHACO AND INTRAOCULAR LENS PLACEMENT ([place])  Left;  Surgeon: Ronnell Freshwater, MD;  Location: [place];  Service: Ophthalmology;  Laterality: Left;  sleep apnea  . COLONOSCOPY    . HAND SURGERY    . KIDNEY TRANSPLANT  [id number]  . PORT-A-CATH REMOVAL    . PORTACATH PLACEMENT    . TUBAL LIGATION      OB History   No obstetric history on file.      Home Medications    Prior to Admission medications   Medication Sig Start Date End Date Taking? Authorizing Provider  amLODipine (NORVASC) 5 MG tablet Take 5 mg by mouth daily.   Yes [provider]  aspirin 81 MG tablet Take 81 mg by mouth daily.   Yes [provider]  benzonatate (TESSALON) 100 MG capsule Take 2 capsules (200 mg total) by mouth every 8 (eight) hours. 12/12/20  Yes Margarette Canada, NP  carvedilol (COREG) 25 MG tablet Take 25 mg by mouth 2 (two) times daily with  a meal.   Yes [provider]  cephALEXin (KEFLEX) 250 MG capsule Take by mouth daily.   Yes [provider]  cholecalciferol (VITAMIN D) 1000 UNITS tablet Take 1,000 Units by mouth daily.   Yes [provider]  colchicine 0.6 MG tablet For acute gout, take 2 tablets by mouth, then 1 tablet one hour later on first day. May continue 1 tablet up to twice a day as needed. 10/03/20  Yes [provider]  eplerenone (INSPRA) 25 MG tablet Take 12.5 mg by mouth daily.   Yes [provider]  Ferrous Sulfate 140 (45 Fe) MG TBCR Take by mouth.   Yes [provider]  fluticasone (FLONASE) 50 MCG/ACT nasal spray as needed. 04/22/15   Yes [provider]  furosemide (LASIX) 40 MG tablet Take 40 mg by mouth daily.    Yes [provider]  hydroxyurea (HYDREA) 500 MG capsule Take 500 mg by mouth 2 (two) times daily.    Yes [provider]  INCRUSE ELLIPTA 62.5 MCG/INH AEPB Inhale 1 puff into the lungs daily. 12/06/20  Yes [provider]  losartan (COZAAR) 50 MG tablet Take 1 tablet by mouth daily. 10/25/20  Yes [provider]  metFORMIN (GLUCOPHAGE) 500 MG tablet Take by mouth. 10/25/20  Yes [provider]  Omega-3 Fatty Acids (FISH OIL) 1000 MG CAPS Take 1 capsule by mouth daily.   Yes [provider]  pantoprazole (PROTONIX) 40 MG tablet  08/26/17  Yes [provider]  pravastatin (PRAVACHOL) 40 MG tablet  08/26/17  Yes [provider]  promethazine-dextromethorphan (PROMETHAZINE-DM) 6.25-15 MG/5ML syrup Take 5 mLs by mouth 4 (four) times daily as needed. 12/12/20  Yes Margarette Canada, NP  sodium bicarbonate 325 MG tablet Take by mouth. 10/25/20  Yes [provider]  zolpidem (AMBIEN) 5 MG tablet Take 1 tablet (5 mg total) by mouth at bedtime as needed for sleep. 09/30/17  Yes Einar Pheasant, MD  acetaminophen (TYLENOL) 325 MG tablet Take 650 mg by mouth every 6 (six) hours as needed.    [provider]  albuterol (PROVENTIL HFA;VENTOLIN HFA) 108 (90 Base) MCG/ACT inhaler Inhale 1-2 puffs into the lungs every 6 (six) hours as needed for wheezing or shortness of breath. Use with spacer 08/21/18   Crecencio Mc P, PA-C  azelastine (ASTELIN) 0.1 % nasal spray as needed. 06/03/15 08/21/18  [provider]  Misc Natural Products (GLUCOSAMINE CHONDROITIN COMPLX) TABS Take 1 tablet by mouth 2 (two) times daily.    [provider]  mupirocin ointment (BACTROBAN) 2 % Apply to affected area bid 09/30/17   Einar Pheasant, MD  MYFORTIC 180 MG EC tablet TAKE 2 TABLETS PO BID 07/08/15   [provider]  tacrolimus (PROGRAF) 1  MG capsule Take 1 mg by mouth 2 (two) times daily.     [provider]    Family History Family History  Problem Relation Age of Onset  . Kidney disease Father   . Heart disease Father        myocardial infarction  . Breast cancer Neg Hx     Social History Social History   Tobacco Use  . Smoking status: Former Smoker    Quit date: 07/12/2001    Years since quitting: 19.4  . Smokeless tobacco: Never Used  Vaping Use  . Vaping Use: Never used  Substance Use Topics  . Alcohol use: Yes    Alcohol/week: 2.0 standard drinks    Types: 2 Glasses  of wine per week    Comment: has an occasional galss of wine  . Drug use: No     Allergies   Macrobid [nitrofurantoin]   Review of Systems Review of Systems  Constitutional: Positive for fever. Negative for activity change and appetite change.  HENT: Positive for congestion, ear pain, rhinorrhea and sore throat.   Respiratory: Positive for cough and shortness of breath. Negative for wheezing.   Cardiovascular: Negative for chest pain, palpitations and leg swelling.  Skin: Negative for rash.  Hematological: Negative.   Psychiatric/Behavioral: Negative.      Physical Exam Triage Vital Signs ED Triage Vitals  Enc Vitals Group     BP 12/12/20 1113 128/61     Pulse Rate 12/12/20 1113 80     Resp 12/12/20 1113 16     Temp 12/12/20 1113 98.4 F (36.9 C)     Temp Source 12/12/20 1113 Oral     SpO2 12/12/20 1113 96 %     Weight 12/12/20 1106 182 lb (82.6 kg)     Height 12/12/20 1106 5\' 1"  (1.549 m)     Head Circumference --      Peak Flow --      Pain Score 12/12/20 1106 0     Pain Loc --      Pain Edu? --      Excl. in [place]? --    No data found.  Updated Vital Signs BP 128/61 (BP Location: Right Arm)   Pulse 80   Temp 98.4 F (36.9 C) (Oral)   Resp 16   Ht 5\' 1"  (1.549 m)   Wt 182 lb (82.6 kg)   SpO2 96%   BMI 34.39 kg/m   Visual Acuity Right Eye Distance:   Left Eye Distance:   Bilateral Distance:     Right Eye Near:   Left Eye Near:    Bilateral Near:     Physical Exam Vitals and nursing note reviewed.  Constitutional:      General: She is not in acute distress.    Appearance: Normal appearance. She is normal weight. She is ill-appearing.  HENT:     Head: Normocephalic and atraumatic.     Right Ear: Tympanic membrane, ear canal and external ear normal. There is no impacted cerumen.     Left Ear: Tympanic membrane, ear canal and external ear normal. There is no impacted cerumen.     Nose: Congestion and rhinorrhea present.     Mouth/Throat:     Mouth: Mucous membranes are moist.     Pharynx: Oropharynx is clear. No posterior oropharyngeal erythema.  Cardiovascular:     Rate and Rhythm: Normal rate and regular rhythm.     Pulses: Normal pulses.     Heart sounds: Normal heart sounds. No murmur heard. No gallop.   Pulmonary:     Effort: Pulmonary effort is normal.     Breath sounds: Normal breath sounds. No wheezing, rhonchi or rales.  Musculoskeletal:     Cervical back: Normal range of motion and neck supple.  Lymphadenopathy:     Cervical: No cervical adenopathy.  Skin:    General: Skin is warm and dry.     Capillary Refill: Capillary refill takes less than 2 seconds.     Findings: No erythema or rash.  Neurological:     General: No focal deficit present.     Mental Status: She is alert and oriented to person, place, and time.  Psychiatric:  Mood and Affect: Mood normal.        Behavior: Behavior normal.        Thought Content: Thought content normal.        Judgment: Judgment normal.      UC Treatments / Results  Labs (all labs ordered are listed, but only abnormal results are displayed) Labs Reviewed  CBC WITH DIFFERENTIAL/PLATELET - Abnormal; Notable for the following components:      Result Value   RBC 1.94 (*)    Hemoglobin 8.3 (*)    HCT 24.9 (*)    MCV 128.4 (*)    MCH 42.8 (*)    Platelets 586 (*)    nRBC 0.3 (*)    Lymphs Abs 0.3 (*)     Abs Immature Granulocytes 0.66 (*)    All other components within normal limits  COMPREHENSIVE METABOLIC PANEL - Abnormal; Notable for the following components:   Sodium 132 (*)    CO2 21 (*)    Glucose, Bld 168 (*)    BUN 55 (*)    Creatinine, Ser 1.75 (*)    GFR, Estimated 31 (*)    All other components within normal limits  RESP PANEL BY RT-PCR (FLU A&B, COVID) [id number]    EKG   Radiology DG Chest 2 View  Result Date: 12/12/2020 CLINICAL DATA:  Cough and chest congestion for 2 weeks. EXAM: CHEST - 2 VIEW COMPARISON:  PA and lateral chest 10/03/2012. FINDINGS: Lungs clear. Heart size normal. No pneumothorax or pleural fluid. Aortic atherosclerosis. No acute or focal bony abnormality. IMPRESSION: No acute disease. Aortic Atherosclerosis (ICD10-I70.0). Electronically Signed   By: Inge Rise M.D.   On: 12/12/2020 12:08    Procedures Procedures (including critical care time)  Medications Ordered in UC Medications - No data to display  Initial Impression / Assessment and Plan / UC Course  I have reviewed the triage vital signs and the nursing notes.  Pertinent labs & imaging results that were available during my care of the patient were reviewed by me and considered in my medical decision making (see chart for details).   Is a very pleasant, yet ill-appearing, 70 year old female with a history of renal transplant on hydroxyurea and Prograf here for evaluation of nasal congestion, chest congestion, and cough that been present for the last 2 weeks.  She reports having a fever at the outset of her symptoms for the first 3 days and then her family over this with her reports that she developed a fever again this morning.  T-max of 100.2.  She said clear nasal discharge, and a productive cough of clear sputum.  She also complained of ear pressure, sore throat at outset of symptoms but has resolved, and shortness of breath with exertion.  Patient is now on mobile oxygen at 2 L/min whereas  before her symptoms started she was not.  She denies any wheezing, chest pain, or swelling in her legs.  Physical exam reveals pearly gray tympanic membranes bilaterally with a normal light reflex and clear external auditory canals.  Nasal mucosa is slightly erythematous and edematous with scant clear nasal discharge.  Oropharyngeal exam is benign.  No cervical lymphadenopathy appreciated exam.  Cardiopulmonary exam is unremarkable.  Patient does have mild edema in both lower extremities that is nonpitting.  She reports that this is pretty common for her and she is on Lasix.  Patient has taken several home COVID test that have been negative and she is unaware of any sick contacts.  Will swab patient for COVID and influenza, check a CBC and CMP, and obtain chest x-ray.  Radiology interpretation of chest x-ray is no active disease.  Respiratory panel is negative for COVID and flu.  CBC shows low RBC count of 1.94, H&H is 8.3 and 24.9, MCV 128.4, MCH 42.8, platelet counts 586.  There are also giant platelets present. This is an interval decrease from 11/11/20 when patient's RBC count was 2.3 to, H&H 10 and 28.8, MCV 123.9, MCH 42.9, and platelets 624.  CMP shows mild hyponatremia with a sodium of 132, BUN 55, creatinine 1.75, transaminases are normal.  Glucose 168.  GFR 31  CMP from 11/11/2020 shows BUN of 59 and creatinine of 1.62, sodium 137, glucose 234, GFR 32.  With the absence of COVID or flu and negative chest x-ray for pneumonia I am concerned that patient's respiratory symptoms are secondary to her blood dyscrasias on her CBC.  I discussed this with the patient, and her husband, they report that she has had lower hematocrit and hemoglobin in the past, but has not required oxygen at that point.  Patient reports that she started herself on oxygen today at 1.5 L because of dyspnea on exertion and her SPO2 dropped to 85% while at home walking from 1 room to another.  I advised the patient that I think it  is best if she is evaluated in the hospital but she and her husband are reluctant.  They are requesting something to treat the cough and state that we will follow-up with her hematologist and nephrologist.  They will also follow-up with her transplant coordinator.  I reiterated to the patient that her blood dyscrasias could be a result of her medication suppressing her bone marrow and attacking her cells.  Patient and husband state they will follow-up with her physicians and she has an appointment with her hematologist next week.  We will discharge patient home with Promethazine DM and Tessalon Perles to help with cough with the agreement that if her respiratory symptoms worsen she is to go to the hospital.  Patient and husband agree.   Final Clinical Impressions(s) / UC Diagnoses   Final diagnoses:  Cough  Dyspnea on exertion     Discharge Instructions     Use the Tessalon Perles every 8 hours during the day as needed for cough.  Take them with a small sip of water.  They may give you some numbness to the base of your tongue or metallic taste in her mouth, this is normal.  Use the Promethazine DM cough syrup at bedtime as it would make you drowsy.  If you have to increase your oxygen or if you have a worsening of your shortness of breath you can go to the ER for evaluation.  You need to follow-up with your transplant coordinator and your hematologist regarding your blood work.    ED Prescriptions    Medication Sig Dispense Auth. Provider   benzonatate (TESSALON) 100 MG capsule Take 2 capsules (200 mg total) by mouth every 8 (eight) hours. 21 capsule Margarette Canada, NP   promethazine-dextromethorphan (PROMETHAZINE-DM) 6.25-15 MG/5ML syrup Take 5 mLs by mouth 4 (four) times daily as needed. 118 mL Margarette Canada, NP     PDMP not reviewed this encounter.   Margarette Canada, NP 12/12/20 1315

## 2020-12-12 NOTE — Discharge Instructions (Addendum)
Use the Tessalon Perles every 8 hours during the day as needed for cough.  Take them with a small sip of water.  They may give you some numbness to the base of your tongue or metallic taste in her mouth, this is normal.  Use the Promethazine DM cough syrup at bedtime as it would make you drowsy.  If you have to increase your oxygen or if you have a worsening of your shortness of breath you can go to the ER for evaluation.  You need to follow-up with your transplant coordinator and your hematologist regarding your blood work.

## 2020-12-12 NOTE — ED Triage Notes (Signed)
Patient c/o nasal congestion, cough, and chest congestion for 2 weeks.  Patient states that she has been taking OTC muccinex.  Patient denies recent fevers.

## 2020-12-15 ENCOUNTER — Ambulatory Visit: Admit: 2020-12-15 | Discharge: 2020-12-17 | Disposition: A | Discharge status: Home / Self Care | Payer: MEDICARE

## 2020-12-17 DIAGNOSIS — Z940 Kidney transplant status: Principal | ICD-10-CM

## 2020-12-17 MED ORDER — ALBUTEROL SULFATE HFA 90 MCG/ACTUATION AEROSOL INHALER
Freq: Four times a day (QID) | RESPIRATORY_TRACT | 11 refills | 0 days | Status: CN | PRN
Start: 2020-12-17 — End: 2021-12-17

## 2020-12-17 MED ORDER — PROGRAF 0.5 MG CAPSULE
Freq: Two times a day (BID) | ORAL | 0 days
Start: 2020-12-17 — End: ?

## 2020-12-17 MED ORDER — BENZONATATE 100 MG CAPSULE
ORAL_CAPSULE | Freq: Three times a day (TID) | ORAL | 0 refills | 7 days | Status: CP | PRN
Start: 2020-12-17 — End: 2020-12-24

## 2020-12-25 ENCOUNTER — Ambulatory Visit: Admit: 2020-12-25 | Discharge: 2020-12-26 | Payer: MEDICARE

## 2020-12-25 ENCOUNTER — Institutional Professional Consult (permissible substitution): Admit: 2020-12-25 | Discharge: 2020-12-26 | Payer: MEDICARE

## 2020-12-25 DIAGNOSIS — D631 Anemia in chronic kidney disease: Principal | ICD-10-CM

## 2020-12-25 DIAGNOSIS — I5030 Unspecified diastolic (congestive) heart failure: Principal | ICD-10-CM

## 2020-12-25 DIAGNOSIS — T8610 Unspecified complication of kidney transplant: Principal | ICD-10-CM

## 2020-12-25 DIAGNOSIS — Z940 Kidney transplant status: Principal | ICD-10-CM

## 2020-12-25 DIAGNOSIS — N183 Anemia of chronic renal failure, stage 3 (moderate), unspecified whether stage 3a or 3b CKD (CMS-HCC): Principal | ICD-10-CM

## 2020-12-25 DIAGNOSIS — D849 Immunodeficiency, unspecified: Principal | ICD-10-CM

## 2020-12-25 DIAGNOSIS — I272 Pulmonary hypertension, unspecified: Principal | ICD-10-CM

## 2020-12-25 DIAGNOSIS — Z79899 Other long term (current) drug therapy: Principal | ICD-10-CM

## 2021-01-02 ENCOUNTER — Ambulatory Visit: Admit: 2021-01-02 | Discharge: 2021-01-03 | Payer: MEDICARE

## 2021-01-05 ENCOUNTER — Other Ambulatory Visit: Admit: 2021-01-05 | Discharge: 2021-01-05 | Payer: MEDICARE

## 2021-01-05 ENCOUNTER — Ambulatory Visit: Admit: 2021-01-05 | Discharge: 2021-01-05 | Payer: MEDICARE

## 2021-01-05 DIAGNOSIS — N183 Anemia of chronic renal failure, stage 3 (moderate), unspecified whether stage 3a or 3b CKD (CMS-HCC): Principal | ICD-10-CM

## 2021-01-05 DIAGNOSIS — D631 Anemia in chronic kidney disease: Principal | ICD-10-CM

## 2021-01-13 ENCOUNTER — Ambulatory Visit: Admit: 2021-01-13 | Discharge: 2021-01-14 | Payer: MEDICARE

## 2021-01-13 DIAGNOSIS — N183 Anemia of chronic renal failure, stage 3 (moderate), unspecified whether stage 3a or 3b CKD (CMS-HCC): Principal | ICD-10-CM

## 2021-01-13 DIAGNOSIS — D631 Anemia in chronic kidney disease: Principal | ICD-10-CM

## 2021-01-19 ENCOUNTER — Other Ambulatory Visit: Admit: 2021-01-19 | Discharge: 2021-01-20 | Payer: MEDICARE

## 2021-01-19 ENCOUNTER — Ambulatory Visit: Admit: 2021-01-19 | Discharge: 2021-01-20 | Payer: MEDICARE

## 2021-01-19 DIAGNOSIS — D631 Anemia in chronic kidney disease: Principal | ICD-10-CM

## 2021-01-19 DIAGNOSIS — Z79899 Other long term (current) drug therapy: Principal | ICD-10-CM

## 2021-01-19 DIAGNOSIS — T8610 Unspecified complication of kidney transplant: Principal | ICD-10-CM

## 2021-01-19 DIAGNOSIS — D849 Immunodeficiency, unspecified: Principal | ICD-10-CM

## 2021-01-19 DIAGNOSIS — Z940 Kidney transplant status: Principal | ICD-10-CM

## 2021-01-19 DIAGNOSIS — D473 Essential (hemorrhagic) thrombocythemia: Principal | ICD-10-CM

## 2021-01-19 DIAGNOSIS — N183 Anemia of chronic renal failure, stage 3 (moderate), unspecified whether stage 3a or 3b CKD (CMS-HCC): Principal | ICD-10-CM

## 2021-01-20 ENCOUNTER — Ambulatory Visit: Admit: 2021-01-20 | Discharge: 2021-01-20 | Payer: MEDICARE

## 2021-01-20 DIAGNOSIS — N183 Anemia of chronic renal failure, stage 3 (moderate), unspecified whether stage 3a or 3b CKD (CMS-HCC): Principal | ICD-10-CM

## 2021-01-20 DIAGNOSIS — D473 Essential (hemorrhagic) thrombocythemia: Principal | ICD-10-CM

## 2021-01-20 DIAGNOSIS — R911 Solitary pulmonary nodule: Principal | ICD-10-CM

## 2021-01-20 DIAGNOSIS — D631 Anemia in chronic kidney disease: Principal | ICD-10-CM

## 2021-01-22 MED ORDER — HYDROXYUREA 500 MG CAPSULE
ORAL_CAPSULE | 0 refills | 0 days
Start: 2021-01-22 — End: ?

## 2021-01-23 MED ORDER — FUROSEMIDE 40 MG TABLET
Freq: Every day | ORAL | 0 days
Start: 2021-01-23 — End: ?

## 2021-01-23 MED ORDER — HYDROXYUREA 500 MG CAPSULE
ORAL_CAPSULE | 0 refills | 0 days | Status: CP
Start: 2021-01-23 — End: ?

## 2021-01-27 ENCOUNTER — Telehealth: Admit: 2021-01-27 | Discharge: 2021-01-28 | Payer: MEDICARE | Attending: Adult Health | Primary: Adult Health

## 2021-01-27 ENCOUNTER — Ambulatory Visit: Admit: 2021-01-27 | Discharge: 2021-01-28 | Payer: MEDICARE | Attending: Gerontology | Primary: Gerontology

## 2021-01-27 DIAGNOSIS — D473 Essential (hemorrhagic) thrombocythemia: Principal | ICD-10-CM

## 2021-02-02 ENCOUNTER — Encounter: Admit: 2021-02-02 | Discharge: 2021-02-03 | Payer: MEDICARE | Attending: Nephrology | Primary: Nephrology

## 2021-02-02 ENCOUNTER — Encounter: Admit: 2021-02-02 | Discharge: 2021-02-03 | Payer: MEDICARE

## 2021-02-02 ENCOUNTER — Ambulatory Visit: Admit: 2021-02-02 | Discharge: 2021-02-03 | Payer: MEDICARE | Attending: Gerontology | Primary: Gerontology

## 2021-02-02 ENCOUNTER — Other Ambulatory Visit: Admit: 2021-02-02 | Discharge: 2021-02-03 | Payer: MEDICARE

## 2021-02-02 ENCOUNTER — Ambulatory Visit: Admit: 2021-02-02 | Discharge: 2021-02-03 | Payer: MEDICARE

## 2021-02-02 DIAGNOSIS — D849 Immunodeficiency, unspecified: Principal | ICD-10-CM

## 2021-02-02 DIAGNOSIS — N183 Anemia of chronic renal failure, stage 3 (moderate), unspecified whether stage 3a or 3b CKD (CMS-HCC): Principal | ICD-10-CM

## 2021-02-02 DIAGNOSIS — T8610 Unspecified complication of kidney transplant: Principal | ICD-10-CM

## 2021-02-02 DIAGNOSIS — D631 Anemia in chronic kidney disease: Principal | ICD-10-CM

## 2021-02-02 DIAGNOSIS — Z940 Kidney transplant status: Principal | ICD-10-CM

## 2021-02-02 DIAGNOSIS — Z79899 Other long term (current) drug therapy: Principal | ICD-10-CM

## 2021-02-03 ENCOUNTER — Ambulatory Visit: Admit: 2021-02-03 | Discharge: 2021-02-05 | Payer: MEDICARE

## 2021-02-03 ENCOUNTER — Ambulatory Visit: Admit: 2021-02-03 | Discharge: 2021-02-05 | Payer: MEDICARE | Attending: Dermatology | Primary: Dermatology

## 2021-02-03 DIAGNOSIS — M109 Gout, unspecified: Principal | ICD-10-CM

## 2021-02-03 DIAGNOSIS — E118 Type 2 diabetes mellitus with unspecified complications: Principal | ICD-10-CM

## 2021-02-04 MED ORDER — PREDNISONE 10 MG TABLET
ORAL_TABLET | 1 refills | 0 days | Status: CP
Start: 2021-02-04 — End: ?

## 2021-02-04 MED ORDER — ALLOPURINOL 100 MG TABLET
ORAL_TABLET | Freq: Every day | ORAL | 11 refills | 30 days | Status: CP
Start: 2021-02-04 — End: 2022-02-04

## 2021-02-05 DIAGNOSIS — Z940 Kidney transplant status: Principal | ICD-10-CM

## 2021-02-05 DIAGNOSIS — Z79899 Other long term (current) drug therapy: Principal | ICD-10-CM

## 2021-02-06 ENCOUNTER — Ambulatory Visit: Admit: 2021-02-06 | Discharge: 2021-02-07 | Payer: MEDICARE

## 2021-02-06 ENCOUNTER — Ambulatory Visit: Admit: 2021-02-06 | Discharge: 2021-02-07 | Payer: MEDICARE | Attending: Nephrology | Primary: Nephrology

## 2021-02-06 DIAGNOSIS — Z940 Kidney transplant status: Principal | ICD-10-CM

## 2021-02-06 DIAGNOSIS — Z79899 Other long term (current) drug therapy: Principal | ICD-10-CM

## 2021-02-06 MED ORDER — SODIUM BICARBONATE 325 MG TABLET
ORAL_TABLET | 3 refills | 0 days
Start: 2021-02-06 — End: ?

## 2021-02-16 ENCOUNTER — Ambulatory Visit: Admit: 2021-02-16 | Discharge: 2021-02-17 | Payer: MEDICARE

## 2021-02-16 ENCOUNTER — Other Ambulatory Visit: Admit: 2021-02-16 | Discharge: 2021-02-17 | Payer: MEDICARE

## 2021-02-16 ENCOUNTER — Encounter: Admit: 2021-02-16 | Discharge: 2021-02-17 | Payer: MEDICARE

## 2021-02-16 DIAGNOSIS — Z79899 Other long term (current) drug therapy: Principal | ICD-10-CM

## 2021-02-16 DIAGNOSIS — D473 Essential (hemorrhagic) thrombocythemia: Principal | ICD-10-CM

## 2021-02-16 DIAGNOSIS — D849 Immunodeficiency, unspecified: Principal | ICD-10-CM

## 2021-02-16 DIAGNOSIS — D631 Anemia in chronic kidney disease: Principal | ICD-10-CM

## 2021-02-16 DIAGNOSIS — T8610 Unspecified complication of kidney transplant: Principal | ICD-10-CM

## 2021-02-16 DIAGNOSIS — N183 Anemia of chronic renal failure, stage 3 (moderate), unspecified whether stage 3a or 3b CKD (CMS-HCC): Principal | ICD-10-CM

## 2021-02-16 DIAGNOSIS — Z940 Kidney transplant status: Principal | ICD-10-CM

## 2021-02-27 ENCOUNTER — Ambulatory Visit: Admit: 2021-02-27 | Discharge: 2021-02-28 | Payer: MEDICARE | Attending: Dermatology | Primary: Dermatology

## 2021-02-27 DIAGNOSIS — Z85828 Personal history of other malignant neoplasm of skin: Principal | ICD-10-CM

## 2021-02-27 DIAGNOSIS — D492 Neoplasm of unspecified behavior of bone, soft tissue, and skin: Principal | ICD-10-CM

## 2021-03-02 ENCOUNTER — Ambulatory Visit: Admit: 2021-03-02 | Payer: MEDICARE

## 2021-03-11 DIAGNOSIS — D0439 Carcinoma in situ of skin of other parts of face: Principal | ICD-10-CM

## 2021-03-15 ENCOUNTER — Ambulatory Visit: Admit: 2021-03-15 | Discharge: 2021-03-16 | Payer: MEDICARE

## 2021-03-15 ENCOUNTER — Encounter: Admit: 2021-03-15 | Discharge: 2021-03-16 | Payer: MEDICARE

## 2021-03-15 DIAGNOSIS — N183 Anemia of chronic renal failure, stage 3 (moderate), unspecified whether stage 3a or 3b CKD (CMS-HCC): Principal | ICD-10-CM

## 2021-03-15 DIAGNOSIS — D631 Anemia in chronic kidney disease: Principal | ICD-10-CM

## 2021-03-30 ENCOUNTER — Other Ambulatory Visit: Admit: 2021-03-30 | Discharge: 2021-03-31 | Payer: MEDICARE

## 2021-03-30 ENCOUNTER — Encounter: Admit: 2021-03-30 | Discharge: 2021-03-31 | Payer: MEDICARE

## 2021-03-30 ENCOUNTER — Ambulatory Visit: Admit: 2021-03-30 | Discharge: 2021-03-31 | Payer: MEDICARE

## 2021-03-30 ENCOUNTER — Encounter: Admit: 2021-03-30 | Discharge: 2021-03-31 | Payer: MEDICARE | Attending: Nephrology | Primary: Nephrology

## 2021-03-30 DIAGNOSIS — D631 Anemia in chronic kidney disease: Principal | ICD-10-CM

## 2021-03-30 DIAGNOSIS — Z940 Kidney transplant status: Principal | ICD-10-CM

## 2021-03-30 DIAGNOSIS — D849 Immunodeficiency, unspecified: Principal | ICD-10-CM

## 2021-03-30 DIAGNOSIS — Z79899 Other long term (current) drug therapy: Principal | ICD-10-CM

## 2021-03-30 DIAGNOSIS — T8610 Unspecified complication of kidney transplant: Principal | ICD-10-CM

## 2021-03-30 DIAGNOSIS — N183 Anemia of chronic renal failure, stage 3 (moderate), unspecified whether stage 3a or 3b CKD (CMS-HCC): Principal | ICD-10-CM

## 2021-04-03 DIAGNOSIS — D849 Immunodeficiency, unspecified: Principal | ICD-10-CM

## 2021-04-03 DIAGNOSIS — Z940 Kidney transplant status: Principal | ICD-10-CM

## 2021-04-03 DIAGNOSIS — Z79899 Other long term (current) drug therapy: Principal | ICD-10-CM

## 2021-04-03 MED ORDER — PROGRAF 0.5 MG CAPSULE
ORAL_CAPSULE | 11 refills | 0 days | Status: CP
Start: 2021-04-03 — End: ?

## 2021-04-13 ENCOUNTER — Other Ambulatory Visit: Admit: 2021-04-13 | Discharge: 2021-04-14 | Payer: MEDICARE

## 2021-04-13 ENCOUNTER — Ambulatory Visit: Admit: 2021-04-13 | Discharge: 2021-04-14 | Payer: MEDICARE

## 2021-04-13 ENCOUNTER — Encounter: Admit: 2021-04-13 | Discharge: 2021-04-14 | Payer: MEDICARE

## 2021-04-13 DIAGNOSIS — N183 Anemia of chronic renal failure, stage 3 (moderate), unspecified whether stage 3a or 3b CKD (CMS-HCC): Principal | ICD-10-CM

## 2021-04-13 DIAGNOSIS — D473 Essential (hemorrhagic) thrombocythemia: Principal | ICD-10-CM

## 2021-04-13 DIAGNOSIS — D631 Anemia in chronic kidney disease: Principal | ICD-10-CM

## 2021-04-21 ENCOUNTER — Ambulatory Visit: Admit: 2021-04-21 | Discharge: 2021-04-21 | Payer: MEDICARE

## 2021-04-21 DIAGNOSIS — R911 Solitary pulmonary nodule: Principal | ICD-10-CM

## 2021-04-22 DIAGNOSIS — Z940 Kidney transplant status: Principal | ICD-10-CM

## 2021-04-22 DIAGNOSIS — D473 Essential (hemorrhagic) thrombocythemia: Principal | ICD-10-CM

## 2021-04-22 MED ORDER — FUROSEMIDE 40 MG TABLET
ORAL_TABLET | 0 refills | 0.00000 days
Start: 2021-04-22 — End: ?

## 2021-04-22 MED ORDER — HYDROXYUREA 500 MG CAPSULE
ORAL_CAPSULE | 0 refills | 0 days
Start: 2021-04-22 — End: ?

## 2021-04-22 MED ORDER — MYFORTIC 180 MG TABLET,DELAYED RELEASE
ORAL_TABLET | 0 refills | 0 days
Start: 2021-04-22 — End: ?

## 2021-04-23 MED ORDER — HYDROXYUREA 500 MG CAPSULE
ORAL_CAPSULE | ORAL | 0 refills | 0.00000 days | Status: CP
Start: 2021-04-23 — End: ?

## 2021-04-24 MED ORDER — MYFORTIC 180 MG TABLET,DELAYED RELEASE
ORAL_TABLET | ORAL | 11 refills | 0.00000 days | Status: CP
Start: 2021-04-24 — End: ?

## 2021-04-24 MED ORDER — FUROSEMIDE 40 MG TABLET
ORAL_TABLET | 11 refills | 0 days | Status: CP
Start: 2021-04-24 — End: ?

## 2021-04-27 ENCOUNTER — Encounter: Admit: 2021-04-27 | Discharge: 2021-04-27 | Payer: MEDICARE | Attending: Nephrology | Primary: Nephrology

## 2021-04-27 ENCOUNTER — Ambulatory Visit: Admit: 2021-04-27 | Discharge: 2021-04-27 | Payer: MEDICARE

## 2021-04-27 ENCOUNTER — Other Ambulatory Visit: Admit: 2021-04-27 | Discharge: 2021-04-27 | Payer: MEDICARE

## 2021-04-27 ENCOUNTER — Encounter: Admit: 2021-04-27 | Discharge: 2021-04-27 | Payer: MEDICARE

## 2021-04-27 DIAGNOSIS — T8610 Unspecified complication of kidney transplant: Principal | ICD-10-CM

## 2021-04-27 DIAGNOSIS — D849 Immunodeficiency, unspecified: Principal | ICD-10-CM

## 2021-04-27 DIAGNOSIS — N183 Anemia of chronic renal failure, stage 3 (moderate), unspecified whether stage 3a or 3b CKD (CMS-HCC): Principal | ICD-10-CM

## 2021-04-27 DIAGNOSIS — Z79899 Other long term (current) drug therapy: Principal | ICD-10-CM

## 2021-04-27 DIAGNOSIS — Z940 Kidney transplant status: Principal | ICD-10-CM

## 2021-04-27 DIAGNOSIS — D631 Anemia in chronic kidney disease: Principal | ICD-10-CM

## 2021-04-28 ENCOUNTER — Ambulatory Visit: Admit: 2021-04-28 | Discharge: 2021-04-29 | Payer: MEDICARE

## 2021-04-28 ENCOUNTER — Telehealth: Admit: 2021-04-28 | Discharge: 2021-04-29 | Payer: MEDICARE | Attending: Adult Health | Primary: Adult Health

## 2021-04-28 DIAGNOSIS — N183 Anemia of chronic renal failure, stage 3 (moderate), unspecified whether stage 3a or 3b CKD (CMS-HCC): Principal | ICD-10-CM

## 2021-04-28 DIAGNOSIS — D473 Essential (hemorrhagic) thrombocythemia: Principal | ICD-10-CM

## 2021-04-28 DIAGNOSIS — D631 Anemia in chronic kidney disease: Principal | ICD-10-CM

## 2021-04-28 MED ORDER — HYDROXYUREA 500 MG CAPSULE
ORAL_CAPSULE | Freq: Every day | ORAL | 5 refills | 90 days | Status: CP
Start: 2021-04-28 — End: ?

## 2021-05-05 ENCOUNTER — Ambulatory Visit: Admit: 2021-05-05 | Discharge: 2021-05-06 | Payer: MEDICARE

## 2021-05-05 DIAGNOSIS — Z8616 History of severe acute respiratory syndrome coronavirus 2 (SARS-CoV-2) disease: Principal | ICD-10-CM

## 2021-05-05 DIAGNOSIS — G4733 Obstructive sleep apnea (adult) (pediatric): Principal | ICD-10-CM

## 2021-05-05 DIAGNOSIS — J449 Chronic obstructive pulmonary disease, unspecified: Principal | ICD-10-CM

## 2021-05-05 DIAGNOSIS — Z940 Kidney transplant status: Principal | ICD-10-CM

## 2021-05-05 DIAGNOSIS — I5030 Unspecified diastolic (congestive) heart failure: Principal | ICD-10-CM

## 2021-05-05 DIAGNOSIS — R911 Solitary pulmonary nodule: Principal | ICD-10-CM

## 2021-05-05 DIAGNOSIS — Z9989 Dependence on other enabling machines and devices: Principal | ICD-10-CM

## 2021-05-06 DIAGNOSIS — G4733 Obstructive sleep apnea (adult) (pediatric): Principal | ICD-10-CM

## 2021-05-06 DIAGNOSIS — Z9989 Dependence on other enabling machines and devices: Principal | ICD-10-CM

## 2021-05-07 DIAGNOSIS — Z9989 Dependence on other enabling machines and devices: Principal | ICD-10-CM

## 2021-05-07 DIAGNOSIS — G4733 Obstructive sleep apnea (adult) (pediatric): Principal | ICD-10-CM

## 2021-05-11 ENCOUNTER — Ambulatory Visit: Admit: 2021-05-11 | Discharge: 2021-05-12 | Payer: MEDICARE

## 2021-05-11 ENCOUNTER — Encounter: Admit: 2021-05-11 | Discharge: 2021-05-12 | Payer: MEDICARE

## 2021-05-11 ENCOUNTER — Other Ambulatory Visit: Admit: 2021-05-11 | Discharge: 2021-05-12 | Payer: MEDICARE

## 2021-05-11 DIAGNOSIS — Z8616 History of severe acute respiratory syndrome coronavirus 2 (SARS-CoV-2) disease: Principal | ICD-10-CM

## 2021-05-11 DIAGNOSIS — Z9989 Dependence on other enabling machines and devices: Principal | ICD-10-CM

## 2021-05-11 DIAGNOSIS — Z940 Kidney transplant status: Principal | ICD-10-CM

## 2021-05-11 DIAGNOSIS — I5030 Unspecified diastolic (congestive) heart failure: Principal | ICD-10-CM

## 2021-05-11 DIAGNOSIS — D631 Anemia in chronic kidney disease: Principal | ICD-10-CM

## 2021-05-11 DIAGNOSIS — R911 Solitary pulmonary nodule: Principal | ICD-10-CM

## 2021-05-11 DIAGNOSIS — G4733 Obstructive sleep apnea (adult) (pediatric): Principal | ICD-10-CM

## 2021-05-11 DIAGNOSIS — N183 Anemia of chronic renal failure, stage 3 (moderate), unspecified whether stage 3a or 3b CKD (CMS-HCC): Principal | ICD-10-CM

## 2021-05-11 DIAGNOSIS — J449 Chronic obstructive pulmonary disease, unspecified: Principal | ICD-10-CM

## 2021-05-12 ENCOUNTER — Ambulatory Visit
Admit: 2021-05-12 | Discharge: 2021-05-29 | Payer: MEDICARE | Attending: Radiation Oncology | Primary: Radiation Oncology

## 2021-05-20 ENCOUNTER — Ambulatory Visit
Admit: 2021-05-20 | Discharge: 2021-05-21 | Payer: MEDICARE | Attending: MOHS-Micrographic Surgery | Primary: MOHS-Micrographic Surgery

## 2021-05-21 ENCOUNTER — Ambulatory Visit
Admit: 2021-05-21 | Discharge: 2021-05-22 | Payer: MEDICARE | Attending: Radiation Oncology | Primary: Radiation Oncology

## 2021-05-21 ENCOUNTER — Ambulatory Visit: Admit: 2021-05-21 | Payer: MEDICARE

## 2021-05-22 ENCOUNTER — Ambulatory Visit: Admit: 2021-05-22 | Discharge: 2021-05-23 | Payer: MEDICARE

## 2021-05-22 DIAGNOSIS — N1832 Stage 3b chronic kidney disease (CMS-HCC): Principal | ICD-10-CM

## 2021-05-22 DIAGNOSIS — I10 Essential (primary) hypertension: Principal | ICD-10-CM

## 2021-05-22 DIAGNOSIS — J449 Chronic obstructive pulmonary disease, unspecified: Principal | ICD-10-CM

## 2021-05-22 DIAGNOSIS — I5030 Unspecified diastolic (congestive) heart failure: Principal | ICD-10-CM

## 2021-05-22 MED ORDER — INCRUSE ELLIPTA 62.5 MCG/ACTUATION POWDER FOR INHALATION
Freq: Every day | RESPIRATORY_TRACT | 3 refills | 90 days | Status: CP
Start: 2021-05-22 — End: ?

## 2021-05-25 ENCOUNTER — Other Ambulatory Visit: Admit: 2021-05-25 | Discharge: 2021-05-26 | Payer: MEDICARE

## 2021-05-25 ENCOUNTER — Encounter: Admit: 2021-05-25 | Discharge: 2021-05-26 | Payer: MEDICARE

## 2021-05-25 ENCOUNTER — Ambulatory Visit: Admit: 2021-05-25 | Discharge: 2021-05-26 | Payer: MEDICARE

## 2021-05-25 DIAGNOSIS — N183 Anemia of chronic renal failure, stage 3 (moderate), unspecified whether stage 3a or 3b CKD (CMS-HCC): Principal | ICD-10-CM

## 2021-05-25 DIAGNOSIS — D631 Anemia in chronic kidney disease: Principal | ICD-10-CM

## 2021-05-29 ENCOUNTER — Ambulatory Visit: Admit: 2021-05-29 | Discharge: 2021-05-29 | Payer: MEDICARE

## 2021-05-29 DIAGNOSIS — M25551 Pain in right hip: Principal | ICD-10-CM

## 2021-05-29 DIAGNOSIS — N390 Urinary tract infection, site not specified: Principal | ICD-10-CM

## 2021-05-29 DIAGNOSIS — E669 Obesity, unspecified: Principal | ICD-10-CM

## 2021-05-29 DIAGNOSIS — Z0000 Encounter for general adult medical examination without abnormal findings: Principal | ICD-10-CM

## 2021-05-29 DIAGNOSIS — F5101 Primary insomnia: Principal | ICD-10-CM

## 2021-05-29 DIAGNOSIS — I5030 Unspecified diastolic (congestive) heart failure: Principal | ICD-10-CM

## 2021-05-29 DIAGNOSIS — D473 Essential (hemorrhagic) thrombocythemia: Principal | ICD-10-CM

## 2021-05-29 MED ORDER — PREDNISONE 10 MG TABLET
ORAL_TABLET | 0 refills | 0.00000 days
Start: 2021-05-29 — End: ?

## 2021-06-02 MED ORDER — PREDNISONE 10 MG TABLET
ORAL_TABLET | 2 refills | 0 days | Status: CP
Start: 2021-06-02 — End: ?

## 2021-06-03 MED ORDER — PREDNISONE 10 MG TABLET
ORAL_TABLET | 0 refills | 0.00000 days
Start: 2021-06-03 — End: ?

## 2021-06-08 ENCOUNTER — Other Ambulatory Visit: Admit: 2021-06-08 | Discharge: 2021-06-09 | Payer: MEDICARE

## 2021-06-08 ENCOUNTER — Encounter: Admit: 2021-06-08 | Discharge: 2021-06-09 | Payer: MEDICARE

## 2021-06-08 ENCOUNTER — Encounter: Admit: 2021-06-08 | Discharge: 2021-06-09 | Payer: MEDICARE | Attending: Nephrology | Primary: Nephrology

## 2021-06-08 ENCOUNTER — Ambulatory Visit: Admit: 2021-06-08 | Discharge: 2021-06-09 | Payer: MEDICARE

## 2021-06-08 DIAGNOSIS — T8610 Unspecified complication of kidney transplant: Principal | ICD-10-CM

## 2021-06-08 DIAGNOSIS — D473 Essential (hemorrhagic) thrombocythemia: Principal | ICD-10-CM

## 2021-06-08 DIAGNOSIS — D849 Immunodeficiency, unspecified: Principal | ICD-10-CM

## 2021-06-08 DIAGNOSIS — Z79899 Other long term (current) drug therapy: Principal | ICD-10-CM

## 2021-06-08 DIAGNOSIS — Z940 Kidney transplant status: Principal | ICD-10-CM

## 2021-06-08 DIAGNOSIS — N183 Anemia of chronic renal failure, stage 3 (moderate), unspecified whether stage 3a or 3b CKD (CMS-HCC): Principal | ICD-10-CM

## 2021-06-08 DIAGNOSIS — D631 Anemia in chronic kidney disease: Principal | ICD-10-CM

## 2021-06-09 DIAGNOSIS — Z940 Kidney transplant status: Principal | ICD-10-CM

## 2021-06-09 DIAGNOSIS — Z79899 Other long term (current) drug therapy: Principal | ICD-10-CM

## 2021-06-11 ENCOUNTER — Ambulatory Visit: Admit: 2021-06-11 | Discharge: 2021-06-12 | Payer: MEDICARE

## 2021-06-11 ENCOUNTER — Ambulatory Visit
Admit: 2021-06-11 | Discharge: 2021-06-20 | Payer: MEDICARE | Attending: Radiation Oncology | Primary: Radiation Oncology

## 2021-06-11 ENCOUNTER — Ambulatory Visit: Admit: 2021-06-12 | Discharge: 2021-06-13 | Payer: MEDICARE | Attending: Nephrology | Primary: Nephrology

## 2021-06-11 ENCOUNTER — Ambulatory Visit
Admit: 2021-06-11 | Discharge: 2021-06-18 | Payer: MEDICARE | Attending: Radiation Oncology | Primary: Radiation Oncology

## 2021-06-11 ENCOUNTER — Ambulatory Visit
Admit: 2021-06-11 | Discharge: 2021-06-16 | Payer: MEDICARE | Attending: Radiation Oncology | Primary: Radiation Oncology

## 2021-06-11 ENCOUNTER — Ambulatory Visit: Admit: 2021-06-12 | Discharge: 2021-06-13 | Payer: MEDICARE

## 2021-06-12 DIAGNOSIS — Z940 Kidney transplant status: Principal | ICD-10-CM

## 2021-06-12 DIAGNOSIS — Z79899 Other long term (current) drug therapy: Principal | ICD-10-CM

## 2021-06-12 MED ORDER — FUROSEMIDE 40 MG TABLET
ORAL_TABLET | Freq: Every day | ORAL | 3 refills | 90 days | Status: CP
Start: 2021-06-12 — End: ?

## 2021-06-22 ENCOUNTER — Ambulatory Visit: Admit: 2021-06-22 | Discharge: 2021-06-23 | Payer: MEDICARE

## 2021-06-22 ENCOUNTER — Other Ambulatory Visit: Admit: 2021-06-22 | Discharge: 2021-06-23 | Payer: MEDICARE

## 2021-06-22 ENCOUNTER — Encounter: Admit: 2021-06-22 | Discharge: 2021-06-23 | Payer: MEDICARE

## 2021-06-22 DIAGNOSIS — D631 Anemia in chronic kidney disease: Principal | ICD-10-CM

## 2021-06-22 DIAGNOSIS — Z940 Kidney transplant status: Principal | ICD-10-CM

## 2021-06-22 DIAGNOSIS — N183 Anemia of chronic renal failure, stage 3 (moderate), unspecified whether stage 3a or 3b CKD (CMS-HCC): Principal | ICD-10-CM

## 2021-06-22 DIAGNOSIS — Z79899 Other long term (current) drug therapy: Principal | ICD-10-CM

## 2021-07-06 ENCOUNTER — Ambulatory Visit: Admit: 2021-07-06 | Discharge: 2021-07-07 | Payer: MEDICARE

## 2021-07-06 ENCOUNTER — Encounter: Admit: 2021-07-06 | Discharge: 2021-07-07 | Payer: MEDICARE

## 2021-07-10 DIAGNOSIS — Z940 Kidney transplant status: Principal | ICD-10-CM

## 2021-07-14 ENCOUNTER — Ambulatory Visit: Admit: 2021-07-14 | Payer: MEDICARE

## 2021-07-17 DIAGNOSIS — N183 Anemia of chronic renal failure, stage 3 (moderate), unspecified whether stage 3a or 3b CKD (CMS-HCC): Principal | ICD-10-CM

## 2021-07-17 DIAGNOSIS — D631 Anemia in chronic kidney disease: Principal | ICD-10-CM

## 2021-07-20 ENCOUNTER — Other Ambulatory Visit: Admit: 2021-07-20 | Discharge: 2021-07-20 | Payer: MEDICARE

## 2021-07-20 ENCOUNTER — Ambulatory Visit: Admit: 2021-07-20 | Discharge: 2021-07-20 | Payer: MEDICARE

## 2021-07-20 ENCOUNTER — Encounter: Admit: 2021-07-20 | Discharge: 2021-07-20 | Payer: MEDICARE

## 2021-07-20 ENCOUNTER — Encounter: Admit: 2021-07-20 | Discharge: 2021-07-20 | Payer: MEDICARE | Attending: Nephrology | Primary: Nephrology

## 2021-07-20 DIAGNOSIS — Z940 Kidney transplant status: Principal | ICD-10-CM

## 2021-07-20 DIAGNOSIS — D631 Anemia in chronic kidney disease: Principal | ICD-10-CM

## 2021-07-20 DIAGNOSIS — Z79899 Other long term (current) drug therapy: Principal | ICD-10-CM

## 2021-07-20 DIAGNOSIS — N183 Anemia of chronic renal failure, stage 3 (moderate), unspecified whether stage 3a or 3b CKD (CMS-HCC): Principal | ICD-10-CM

## 2021-08-03 ENCOUNTER — Encounter: Admit: 2021-08-03 | Discharge: 2021-08-04 | Payer: MEDICARE

## 2021-08-03 ENCOUNTER — Ambulatory Visit: Admit: 2021-08-03 | Discharge: 2021-08-04 | Payer: MEDICARE

## 2021-08-03 ENCOUNTER — Other Ambulatory Visit: Admit: 2021-08-03 | Discharge: 2021-08-04 | Payer: MEDICARE

## 2021-08-03 DIAGNOSIS — D631 Anemia in chronic kidney disease: Principal | ICD-10-CM

## 2021-08-03 DIAGNOSIS — D45 Polycythemia vera: Principal | ICD-10-CM

## 2021-08-03 DIAGNOSIS — Z940 Kidney transplant status: Principal | ICD-10-CM

## 2021-08-03 DIAGNOSIS — D473 Essential (hemorrhagic) thrombocythemia: Principal | ICD-10-CM

## 2021-08-03 DIAGNOSIS — N183 Anemia of chronic renal failure, stage 3 (moderate), unspecified whether stage 3a or 3b CKD (CMS-HCC): Principal | ICD-10-CM

## 2021-08-03 DIAGNOSIS — Z79899 Other long term (current) drug therapy: Principal | ICD-10-CM

## 2021-08-04 ENCOUNTER — Ambulatory Visit: Admit: 2021-08-04 | Discharge: 2021-08-05 | Payer: MEDICARE

## 2021-08-04 DIAGNOSIS — D473 Essential (hemorrhagic) thrombocythemia: Principal | ICD-10-CM

## 2021-08-14 DIAGNOSIS — D631 Anemia in chronic kidney disease: Principal | ICD-10-CM

## 2021-08-14 DIAGNOSIS — N183 Anemia of chronic renal failure, stage 3 (moderate), unspecified whether stage 3a or 3b CKD (CMS-HCC): Principal | ICD-10-CM

## 2021-08-17 ENCOUNTER — Encounter: Admit: 2021-08-17 | Discharge: 2021-08-17 | Payer: MEDICARE

## 2021-08-17 ENCOUNTER — Other Ambulatory Visit: Admit: 2021-08-17 | Discharge: 2021-08-17 | Payer: MEDICARE

## 2021-08-17 ENCOUNTER — Ambulatory Visit: Admit: 2021-08-17 | Discharge: 2021-08-17 | Payer: MEDICARE

## 2021-08-17 DIAGNOSIS — Z79899 Other long term (current) drug therapy: Principal | ICD-10-CM

## 2021-08-17 DIAGNOSIS — D631 Anemia in chronic kidney disease: Principal | ICD-10-CM

## 2021-08-17 DIAGNOSIS — N183 Anemia of chronic renal failure, stage 3 (moderate), unspecified whether stage 3a or 3b CKD (CMS-HCC): Principal | ICD-10-CM

## 2021-08-17 DIAGNOSIS — Z940 Kidney transplant status: Principal | ICD-10-CM

## 2021-08-31 ENCOUNTER — Ambulatory Visit: Admit: 2021-08-31 | Payer: MEDICARE

## 2021-09-09 DIAGNOSIS — D631 Anemia in chronic kidney disease: Principal | ICD-10-CM

## 2021-09-09 DIAGNOSIS — N183 Anemia of chronic renal failure, stage 3 (moderate), unspecified whether stage 3a or 3b CKD (CMS-HCC): Principal | ICD-10-CM

## 2021-09-14 ENCOUNTER — Encounter: Admit: 2021-09-14 | Discharge: 2021-09-15 | Payer: MEDICARE

## 2021-09-14 ENCOUNTER — Ambulatory Visit: Admit: 2021-09-14 | Discharge: 2021-09-15 | Payer: MEDICARE

## 2021-09-14 ENCOUNTER — Encounter: Admit: 2021-09-14 | Discharge: 2021-09-15 | Payer: MEDICARE | Attending: Nephrology | Primary: Nephrology

## 2021-09-14 ENCOUNTER — Other Ambulatory Visit: Admit: 2021-09-14 | Discharge: 2021-09-15 | Payer: MEDICARE

## 2021-09-14 DIAGNOSIS — Z940 Kidney transplant status: Principal | ICD-10-CM

## 2021-09-14 DIAGNOSIS — Z79899 Other long term (current) drug therapy: Principal | ICD-10-CM

## 2021-09-14 DIAGNOSIS — D473 Essential (hemorrhagic) thrombocythemia: Principal | ICD-10-CM

## 2021-09-14 DIAGNOSIS — D500 Iron deficiency anemia secondary to blood loss (chronic): Principal | ICD-10-CM

## 2021-09-17 ENCOUNTER — Ambulatory Visit: Admit: 2021-09-17 | Discharge: 2021-09-18 | Payer: MEDICARE

## 2021-09-17 DIAGNOSIS — Z79899 Other long term (current) drug therapy: Principal | ICD-10-CM

## 2021-09-17 DIAGNOSIS — I10 Essential (primary) hypertension: Principal | ICD-10-CM

## 2021-09-17 DIAGNOSIS — Z940 Kidney transplant status: Principal | ICD-10-CM

## 2021-09-17 DIAGNOSIS — M1000 Idiopathic gout, unspecified site: Principal | ICD-10-CM

## 2021-09-18 ENCOUNTER — Ambulatory Visit
Admit: 2021-09-18 | Discharge: 2021-09-19 | Payer: MEDICARE | Attending: Radiation Oncology | Primary: Radiation Oncology

## 2021-09-18 ENCOUNTER — Ambulatory Visit: Admit: 2021-09-18 | Discharge: 2021-09-18 | Payer: MEDICARE

## 2021-09-27 IMAGING — CR DG CHEST 2V
2 series · 2 of 2 positions shown · non-contrast
Comparison: PA and lateral chest 10/03/2012.

CLINICAL DATA: Cough and chest congestion for 2 weeks.

EXAM:
CHEST - 2 VIEW

[chest pa]
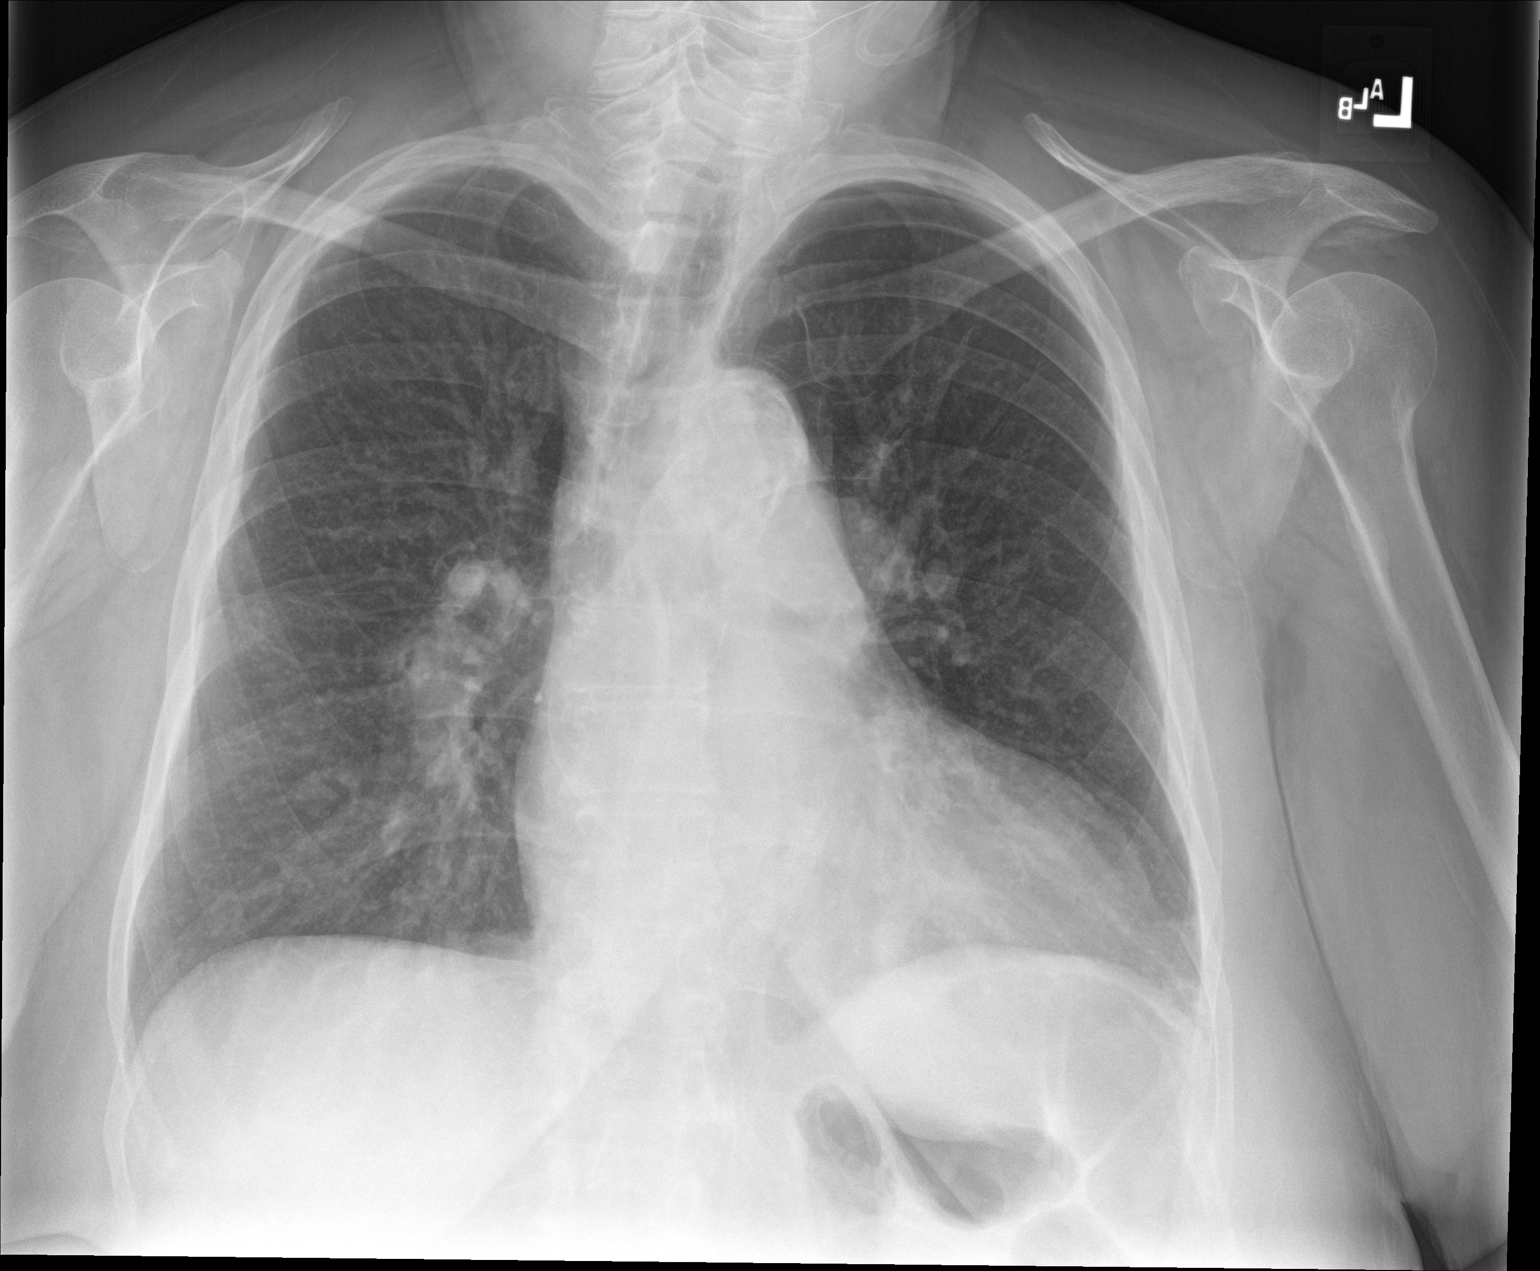

[chest lat]
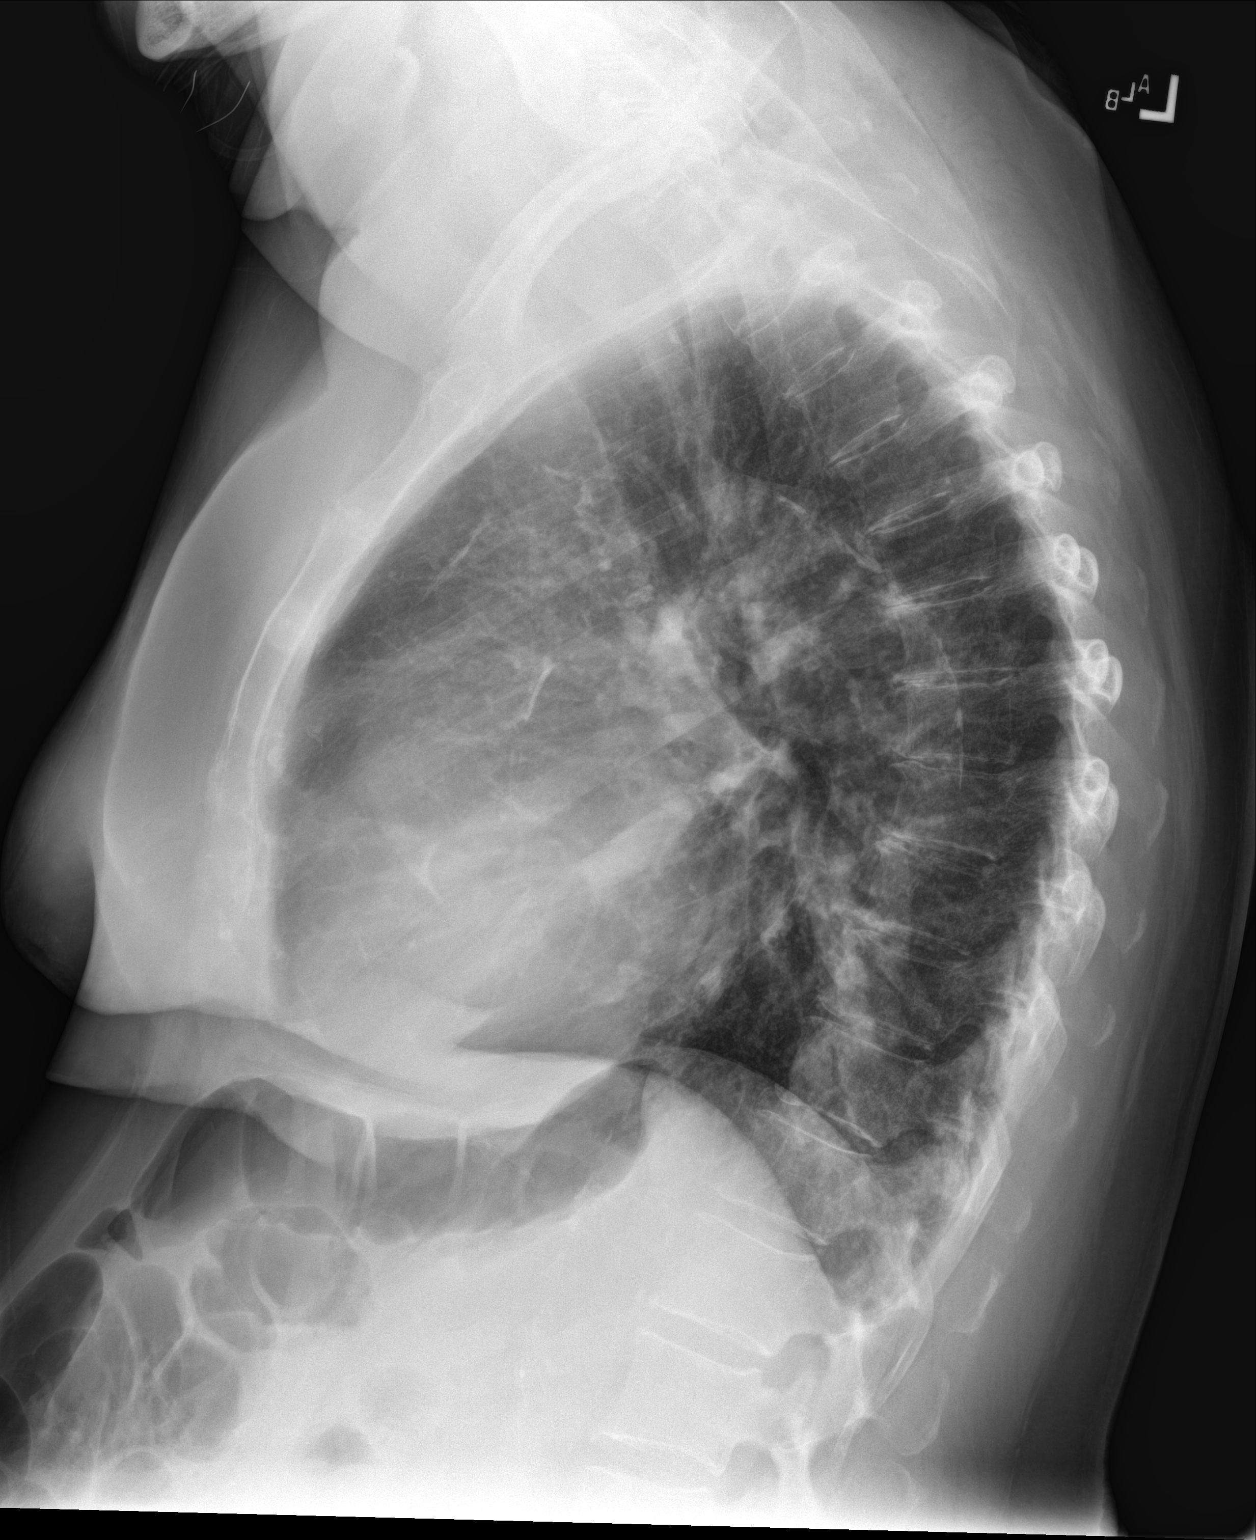

[2 of 2 positions shown; findings below may reference images not displayed]

FINDINGS: Lungs clear. Heart size normal. No pneumothorax or pleural fluid.
Aortic atherosclerosis. No acute or focal bony abnormality.
IMPRESSION: No acute disease.

Aortic Atherosclerosis ([id number]-[id number].W).

## 2021-09-28 ENCOUNTER — Other Ambulatory Visit: Admit: 2021-09-28 | Discharge: 2021-09-29 | Payer: MEDICARE

## 2021-09-28 ENCOUNTER — Ambulatory Visit: Admit: 2021-09-28 | Discharge: 2021-09-29 | Payer: MEDICARE

## 2021-09-28 ENCOUNTER — Encounter: Admit: 2021-09-28 | Discharge: 2021-09-29 | Payer: MEDICARE

## 2021-09-28 DIAGNOSIS — D631 Anemia in chronic kidney disease: Principal | ICD-10-CM

## 2021-09-28 DIAGNOSIS — N183 Anemia of chronic renal failure, stage 3 (moderate), unspecified whether stage 3a or 3b CKD (CMS-HCC): Principal | ICD-10-CM

## 2021-10-01 ENCOUNTER — Ambulatory Visit: Admit: 2021-10-01 | Discharge: 2021-10-02 | Payer: MEDICARE

## 2021-10-01 DIAGNOSIS — L570 Actinic keratosis: Principal | ICD-10-CM

## 2021-10-01 DIAGNOSIS — Z85828 Personal history of other malignant neoplasm of skin: Principal | ICD-10-CM

## 2021-10-01 DIAGNOSIS — L821 Other seborrheic keratosis: Principal | ICD-10-CM

## 2021-10-01 DIAGNOSIS — D1801 Hemangioma of skin and subcutaneous tissue: Principal | ICD-10-CM

## 2021-10-01 DIAGNOSIS — L814 Other melanin hyperpigmentation: Principal | ICD-10-CM

## 2021-10-01 DIAGNOSIS — D229 Melanocytic nevi, unspecified: Principal | ICD-10-CM

## 2021-10-05 DIAGNOSIS — I5030 Unspecified diastolic (congestive) heart failure: Principal | ICD-10-CM

## 2021-10-05 DIAGNOSIS — Z940 Kidney transplant status: Principal | ICD-10-CM

## 2021-10-05 DIAGNOSIS — R059 Cough: Principal | ICD-10-CM

## 2021-10-05 DIAGNOSIS — N2889 Other specified disorders of kidney and ureter: Principal | ICD-10-CM

## 2021-10-05 DIAGNOSIS — I10 Essential (primary) hypertension: Principal | ICD-10-CM

## 2021-10-05 DIAGNOSIS — I151 Hypertension secondary to other renal disorders: Principal | ICD-10-CM

## 2021-10-05 MED ORDER — PRAVASTATIN 40 MG TABLET
ORAL_TABLET | 0 refills | 0 days
Start: 2021-10-05 — End: ?

## 2021-10-05 MED ORDER — EPLERENONE 25 MG TABLET
ORAL_TABLET | 0 refills | 0 days | Status: CP
Start: 2021-10-05 — End: ?

## 2021-10-05 MED ORDER — CARVEDILOL 25 MG TABLET
ORAL_TABLET | 0 refills | 0 days | Status: CP
Start: 2021-10-05 — End: ?

## 2021-10-05 MED ORDER — ALLOPURINOL 100 MG TABLET
ORAL_TABLET | 0 refills | 0 days
Start: 2021-10-05 — End: ?

## 2021-10-05 MED ORDER — SODIUM BICARBONATE 325 MG TABLET
ORAL_TABLET | 0 refills | 0 days
Start: 2021-10-05 — End: ?

## 2021-10-05 MED ORDER — AMLODIPINE 5 MG TABLET
ORAL_TABLET | 0 refills | 0 days | Status: CP
Start: 2021-10-05 — End: ?

## 2021-10-05 MED ORDER — PANTOPRAZOLE 40 MG TABLET,DELAYED RELEASE
ORAL_TABLET | 0 refills | 0 days | Status: CP
Start: 2021-10-05 — End: ?

## 2021-10-05 MED ORDER — CEPHALEXIN 250 MG CAPSULE
ORAL_CAPSULE | 0 refills | 0 days | Status: CP
Start: 2021-10-05 — End: ?

## 2021-10-05 MED ORDER — FLUTICASONE PROPIONATE 50 MCG/ACTUATION NASAL SPRAY,SUSPENSION
0 refills | 0 days | Status: CP
Start: 2021-10-05 — End: ?

## 2021-10-05 MED ORDER — LOSARTAN 50 MG TABLET
ORAL_TABLET | 0 refills | 0 days | Status: CP
Start: 2021-10-05 — End: ?

## 2021-10-05 MED ORDER — METFORMIN 500 MG TABLET
ORAL_TABLET | 0 refills | 0 days
Start: 2021-10-05 — End: ?

## 2021-10-06 MED ORDER — METFORMIN 500 MG TABLET
ORAL_TABLET | 3 refills | 0 days | Status: CP
Start: 2021-10-06 — End: ?

## 2021-10-06 MED ORDER — SODIUM BICARBONATE 325 MG TABLET
ORAL_TABLET | 3 refills | 0 days | Status: CP
Start: 2021-10-06 — End: ?

## 2021-10-06 MED ORDER — ALLOPURINOL 100 MG TABLET
ORAL_TABLET | 3 refills | 0 days | Status: CP
Start: 2021-10-06 — End: ?

## 2021-10-06 MED ORDER — PRAVASTATIN 40 MG TABLET
ORAL_TABLET | 3 refills | 0 days | Status: CP
Start: 2021-10-06 — End: ?

## 2021-10-08 DIAGNOSIS — N2889 Other specified disorders of kidney and ureter: Principal | ICD-10-CM

## 2021-10-08 DIAGNOSIS — Z940 Kidney transplant status: Principal | ICD-10-CM

## 2021-10-08 DIAGNOSIS — R059 Cough: Principal | ICD-10-CM

## 2021-10-08 DIAGNOSIS — I151 Hypertension secondary to other renal disorders: Principal | ICD-10-CM

## 2021-10-08 DIAGNOSIS — I5030 Unspecified diastolic (congestive) heart failure: Principal | ICD-10-CM

## 2021-10-08 MED ORDER — CARVEDILOL 25 MG TABLET
ORAL_TABLET | Freq: Two times a day (BID) | ORAL | 3 refills | 90 days | Status: CP
Start: 2021-10-08 — End: ?

## 2021-10-08 MED ORDER — AMLODIPINE 5 MG TABLET
ORAL_TABLET | Freq: Every day | ORAL | 3 refills | 90 days | Status: CP
Start: 2021-10-08 — End: ?

## 2021-10-08 MED ORDER — FLUTICASONE PROPIONATE 50 MCG/ACTUATION NASAL SPRAY,SUSPENSION
Freq: Two times a day (BID) | NASAL | 3 refills | 60 days | Status: CP
Start: 2021-10-08 — End: ?

## 2021-10-08 MED ORDER — CEPHALEXIN 250 MG CAPSULE
ORAL_CAPSULE | Freq: Every day | ORAL | 3 refills | 90 days | Status: CP
Start: 2021-10-08 — End: ?

## 2021-10-08 MED ORDER — EPLERENONE 25 MG TABLET
ORAL_TABLET | Freq: Every day | ORAL | 3 refills | 90 days | Status: CP
Start: 2021-10-08 — End: ?

## 2021-10-08 MED ORDER — LOSARTAN 50 MG TABLET
ORAL_TABLET | Freq: Every day | ORAL | 3 refills | 90 days | Status: CP
Start: 2021-10-08 — End: ?

## 2021-10-08 MED ORDER — PANTOPRAZOLE 40 MG TABLET,DELAYED RELEASE
ORAL_TABLET | Freq: Every day | ORAL | 3 refills | 90 days | Status: CP
Start: 2021-10-08 — End: ?

## 2021-10-12 ENCOUNTER — Ambulatory Visit: Admit: 2021-10-12 | Discharge: 2021-10-13 | Payer: MEDICARE

## 2021-10-12 ENCOUNTER — Encounter: Admit: 2021-10-12 | Discharge: 2021-10-13 | Payer: MEDICARE

## 2021-11-09 ENCOUNTER — Ambulatory Visit: Admit: 2021-11-09 | Discharge: 2021-11-10 | Payer: MEDICARE | Attending: Internal Medicine | Primary: Internal Medicine

## 2021-11-09 ENCOUNTER — Other Ambulatory Visit: Admit: 2021-11-09 | Discharge: 2021-11-10 | Payer: MEDICARE

## 2021-11-09 ENCOUNTER — Encounter: Admit: 2021-11-09 | Discharge: 2021-11-10 | Payer: MEDICARE

## 2021-11-09 DIAGNOSIS — D473 Essential (hemorrhagic) thrombocythemia: Principal | ICD-10-CM

## 2021-11-09 DIAGNOSIS — Z79899 Other long term (current) drug therapy: Principal | ICD-10-CM

## 2021-11-09 DIAGNOSIS — Z940 Kidney transplant status: Principal | ICD-10-CM

## 2021-11-09 DIAGNOSIS — D500 Iron deficiency anemia secondary to blood loss (chronic): Principal | ICD-10-CM

## 2021-11-11 MED ORDER — PREDNISONE 10 MG TABLET
ORAL_TABLET | 2 refills | 0 days | Status: CP
Start: 2021-11-11 — End: ?

## 2021-11-26 ENCOUNTER — Ambulatory Visit: Admit: 2021-11-26 | Discharge: 2021-11-27 | Payer: MEDICARE

## 2021-11-26 DIAGNOSIS — I5030 Unspecified diastolic (congestive) heart failure: Principal | ICD-10-CM

## 2021-11-26 DIAGNOSIS — N390 Urinary tract infection, site not specified: Principal | ICD-10-CM

## 2021-11-26 DIAGNOSIS — F5101 Primary insomnia: Principal | ICD-10-CM

## 2021-11-26 DIAGNOSIS — D849 Immunodeficiency, unspecified: Principal | ICD-10-CM

## 2021-11-26 DIAGNOSIS — G8929 Other chronic pain: Principal | ICD-10-CM

## 2021-11-26 DIAGNOSIS — Z0000 Encounter for general adult medical examination without abnormal findings: Principal | ICD-10-CM

## 2021-11-26 DIAGNOSIS — R911 Solitary pulmonary nodule: Principal | ICD-10-CM

## 2021-11-26 DIAGNOSIS — E119 Type 2 diabetes mellitus without complications: Principal | ICD-10-CM

## 2021-11-26 DIAGNOSIS — D473 Essential (hemorrhagic) thrombocythemia: Principal | ICD-10-CM

## 2021-11-26 DIAGNOSIS — M25511 Pain in right shoulder: Principal | ICD-10-CM

## 2021-11-30 MED ORDER — EMPAGLIFLOZIN 10 MG TABLET
ORAL_TABLET | Freq: Every day | ORAL | 3 refills | 90 days | Status: CP
Start: 2021-11-30 — End: 2022-11-25

## 2021-12-08 DIAGNOSIS — Z940 Kidney transplant status: Principal | ICD-10-CM

## 2021-12-08 DIAGNOSIS — Z79899 Other long term (current) drug therapy: Principal | ICD-10-CM

## 2021-12-10 ENCOUNTER — Ambulatory Visit: Admit: 2021-12-10 | Discharge: 2021-12-10 | Payer: MEDICARE

## 2021-12-10 ENCOUNTER — Ambulatory Visit: Admit: 2021-12-10 | Discharge: 2021-12-10 | Payer: MEDICARE | Attending: Nephrology | Primary: Nephrology

## 2021-12-10 DIAGNOSIS — Z940 Kidney transplant status: Principal | ICD-10-CM

## 2021-12-10 DIAGNOSIS — R5382 Chronic fatigue, unspecified: Principal | ICD-10-CM

## 2021-12-10 DIAGNOSIS — Z79899 Other long term (current) drug therapy: Principal | ICD-10-CM

## 2021-12-10 DIAGNOSIS — D472 Monoclonal gammopathy: Principal | ICD-10-CM

## 2021-12-10 DIAGNOSIS — M1000 Idiopathic gout, unspecified site: Principal | ICD-10-CM

## 2021-12-10 DIAGNOSIS — D849 Immunodeficiency, unspecified: Principal | ICD-10-CM

## 2021-12-10 DIAGNOSIS — M25511 Pain in right shoulder: Principal | ICD-10-CM

## 2021-12-10 DIAGNOSIS — E119 Type 2 diabetes mellitus without complications: Principal | ICD-10-CM

## 2021-12-10 MED ORDER — ALLOPURINOL 100 MG TABLET
ORAL_TABLET | Freq: Every morning | ORAL | 3 refills | 90 days | Status: CP
Start: 2021-12-10 — End: ?

## 2021-12-10 MED ORDER — SODIUM BICARBONATE 325 MG TABLET
ORAL_TABLET | Freq: Two times a day (BID) | ORAL | 3 refills | 90 days | Status: CP
Start: 2021-12-10 — End: ?

## 2021-12-10 MED ORDER — PROGRAF 0.5 MG CAPSULE
ORAL_CAPSULE | Freq: Two times a day (BID) | ORAL | 11 refills | 30 days | Status: CP
Start: 2021-12-10 — End: ?

## 2021-12-18 ENCOUNTER — Ambulatory Visit: Admit: 2021-12-18 | Discharge: 2021-12-19 | Payer: MEDICARE

## 2021-12-18 MED ORDER — GABAPENTIN 300 MG CAPSULE
ORAL_CAPSULE | Freq: Every evening | ORAL | 0 refills | 30 days | Status: CP | PRN
Start: 2021-12-18 — End: ?

## 2021-12-22 ENCOUNTER — Ambulatory Visit: Admit: 2021-12-22 | Discharge: 2021-12-23 | Payer: MEDICARE

## 2021-12-22 DIAGNOSIS — I5030 Unspecified diastolic (congestive) heart failure: Principal | ICD-10-CM

## 2021-12-22 DIAGNOSIS — Z940 Kidney transplant status: Principal | ICD-10-CM

## 2021-12-22 DIAGNOSIS — G4733 Obstructive sleep apnea (adult) (pediatric): Principal | ICD-10-CM

## 2021-12-22 DIAGNOSIS — R911 Solitary pulmonary nodule: Principal | ICD-10-CM

## 2021-12-22 DIAGNOSIS — Z9989 Dependence on other enabling machines and devices: Principal | ICD-10-CM

## 2021-12-22 DIAGNOSIS — J449 Chronic obstructive pulmonary disease, unspecified: Principal | ICD-10-CM

## 2022-01-08 ENCOUNTER — Ambulatory Visit
Admit: 2022-01-08 | Discharge: 2022-01-08 | Payer: MEDICARE | Attending: Radiation Oncology | Primary: Radiation Oncology

## 2022-01-08 ENCOUNTER — Ambulatory Visit: Admit: 2022-01-08 | Payer: MEDICARE | Attending: Radiation Oncology | Primary: Radiation Oncology

## 2022-01-08 ENCOUNTER — Ambulatory Visit: Admit: 2022-01-08 | Discharge: 2022-01-08 | Payer: MEDICARE

## 2022-01-18 ENCOUNTER — Ambulatory Visit
Admit: 2022-01-18 | Discharge: 2022-01-19 | Payer: MEDICARE | Attending: Student in an Organized Health Care Education/Training Program | Primary: Student in an Organized Health Care Education/Training Program

## 2022-01-28 ENCOUNTER — Encounter: Admit: 2022-01-28 | Discharge: 2022-01-29 | Payer: MEDICARE

## 2022-01-28 ENCOUNTER — Ambulatory Visit: Admit: 2022-01-28 | Discharge: 2022-01-29 | Payer: MEDICARE

## 2022-01-28 MED ORDER — SODIUM BICARBONATE 325 MG TABLET
ORAL_TABLET | Freq: Two times a day (BID) | ORAL | 3 refills | 90 days | Status: CP
Start: 2022-01-28 — End: ?

## 2022-02-02 ENCOUNTER — Ambulatory Visit
Admit: 2022-02-02 | Payer: MEDICARE | Attending: Rehabilitative and Restorative Service Providers" | Primary: Rehabilitative and Restorative Service Providers"

## 2022-02-03 ENCOUNTER — Telehealth: Payer: Self-pay | Admitting: Internal Medicine

## 2022-02-03 NOTE — Telephone Encounter (Signed)
Pt has not been seen in office since 2019 LM for pt to cb re: is she receiving care elsewhere? If not needs OV

## 2022-02-03 NOTE — Telephone Encounter (Signed)
Remo Lipps From [facility] called in requesting script for pt CPAP supplies... Remo Lipps stated that pt needs disposable mask, filter, tubing, and head gear... joan requesting callback at [phone].Marland KitchenMarland KitchenMarland Kitchen

## 2022-02-08 DIAGNOSIS — D45 Polycythemia vera: Principal | ICD-10-CM

## 2022-02-09 ENCOUNTER — Ambulatory Visit: Admit: 2022-02-09 | Discharge: 2022-02-09 | Payer: MEDICARE

## 2022-02-09 DIAGNOSIS — R911 Solitary pulmonary nodule: Principal | ICD-10-CM

## 2022-02-09 DIAGNOSIS — Z9989 Dependence on other enabling machines and devices: Principal | ICD-10-CM

## 2022-02-09 DIAGNOSIS — D472 Monoclonal gammopathy: Principal | ICD-10-CM

## 2022-02-09 DIAGNOSIS — D45 Polycythemia vera: Principal | ICD-10-CM

## 2022-02-09 DIAGNOSIS — Z79899 Other long term (current) drug therapy: Principal | ICD-10-CM

## 2022-02-09 DIAGNOSIS — I5030 Unspecified diastolic (congestive) heart failure: Principal | ICD-10-CM

## 2022-02-09 DIAGNOSIS — G4733 Obstructive sleep apnea (adult) (pediatric): Principal | ICD-10-CM

## 2022-02-09 DIAGNOSIS — Z940 Kidney transplant status: Principal | ICD-10-CM

## 2022-02-09 DIAGNOSIS — J449 Chronic obstructive pulmonary disease, unspecified: Principal | ICD-10-CM

## 2022-03-18 ENCOUNTER — Ambulatory Visit: Admit: 2022-03-18 | Discharge: 2022-03-19 | Payer: MEDICARE

## 2022-03-18 DIAGNOSIS — D473 Essential (hemorrhagic) thrombocythemia: Principal | ICD-10-CM

## 2022-03-23 MED ORDER — CEPHALEXIN 250 MG CAPSULE
ORAL_CAPSULE | 0 refills | 0 days
Start: 2022-03-23 — End: ?

## 2022-04-09 ENCOUNTER — Ambulatory Visit
Admit: 2022-04-09 | Discharge: 2022-04-10 | Payer: MEDICARE | Attending: Radiation Oncology | Primary: Radiation Oncology

## 2022-04-09 ENCOUNTER — Ambulatory Visit: Admit: 2022-04-09 | Discharge: 2022-04-09 | Payer: MEDICARE

## 2022-04-09 MED ORDER — FUROSEMIDE 40 MG TABLET
ORAL_TABLET | Freq: Every day | ORAL | 3 refills | 90 days | Status: CP
Start: 2022-04-09 — End: ?

## 2022-04-13 DIAGNOSIS — Z79899 Other long term (current) drug therapy: Principal | ICD-10-CM

## 2022-04-13 DIAGNOSIS — Z940 Kidney transplant status: Principal | ICD-10-CM

## 2022-05-10 ENCOUNTER — Encounter: Admit: 2022-05-10 | Discharge: 2022-05-10 | Payer: MEDICARE | Attending: Nephrology | Primary: Nephrology

## 2022-05-10 ENCOUNTER — Other Ambulatory Visit: Admit: 2022-05-10 | Discharge: 2022-05-10 | Payer: MEDICARE

## 2022-05-10 ENCOUNTER — Ambulatory Visit: Admit: 2022-05-10 | Discharge: 2022-05-10 | Payer: MEDICARE | Attending: Internal Medicine | Primary: Internal Medicine

## 2022-05-10 DIAGNOSIS — Z940 Kidney transplant status: Principal | ICD-10-CM

## 2022-05-10 DIAGNOSIS — Z79899 Other long term (current) drug therapy: Principal | ICD-10-CM

## 2022-05-10 DIAGNOSIS — D473 Essential (hemorrhagic) thrombocythemia: Principal | ICD-10-CM

## 2022-05-10 MED ORDER — MYFORTIC 180 MG TABLET,DELAYED RELEASE
ORAL_TABLET | Freq: Two times a day (BID) | ORAL | 3 refills | 90 days | Status: CP
Start: 2022-05-10 — End: 2023-05-10

## 2022-05-11 ENCOUNTER — Ambulatory Visit: Admit: 2022-05-11 | Discharge: 2022-05-12 | Payer: MEDICARE

## 2022-05-11 DIAGNOSIS — D229 Melanocytic nevi, unspecified: Principal | ICD-10-CM

## 2022-05-11 DIAGNOSIS — Z85828 Personal history of other malignant neoplasm of skin: Principal | ICD-10-CM

## 2022-05-11 DIAGNOSIS — L814 Other melanin hyperpigmentation: Principal | ICD-10-CM

## 2022-05-11 DIAGNOSIS — L821 Other seborrheic keratosis: Principal | ICD-10-CM

## 2022-05-11 DIAGNOSIS — D1801 Hemangioma of skin and subcutaneous tissue: Principal | ICD-10-CM

## 2022-05-14 ENCOUNTER — Ambulatory Visit: Admit: 2022-05-14 | Discharge: 2022-05-15 | Payer: MEDICARE

## 2022-05-26 DIAGNOSIS — Z940 Kidney transplant status: Principal | ICD-10-CM

## 2022-05-26 DIAGNOSIS — Z79899 Other long term (current) drug therapy: Principal | ICD-10-CM

## 2022-06-11 ENCOUNTER — Ambulatory Visit: Admit: 2022-06-11 | Discharge: 2022-06-12 | Payer: MEDICARE

## 2022-06-15 ENCOUNTER — Ambulatory Visit: Admit: 2022-06-15 | Discharge: 2022-06-16 | Payer: MEDICARE

## 2022-06-15 DIAGNOSIS — D500 Iron deficiency anemia secondary to blood loss (chronic): Principal | ICD-10-CM

## 2022-06-18 ENCOUNTER — Ambulatory Visit: Admit: 2022-06-18 | Discharge: 2022-06-19 | Payer: MEDICARE

## 2022-06-18 DIAGNOSIS — I5030 Unspecified diastolic (congestive) heart failure: Principal | ICD-10-CM

## 2022-06-18 DIAGNOSIS — I1A0 Resistant hypertension: Principal | ICD-10-CM

## 2022-06-30 DIAGNOSIS — Z79899 Other long term (current) drug therapy: Principal | ICD-10-CM

## 2022-06-30 DIAGNOSIS — Z940 Kidney transplant status: Principal | ICD-10-CM

## 2022-07-01 ENCOUNTER — Ambulatory Visit: Admit: 2022-07-01 | Discharge: 2022-07-02 | Payer: MEDICARE | Attending: Nephrology | Primary: Nephrology

## 2022-07-01 MED ORDER — ZOLPIDEM 5 MG TABLET
ORAL_TABLET | Freq: Every evening | ORAL | 3 refills | 30 days | Status: CP | PRN
Start: 2022-07-01 — End: ?

## 2022-07-16 ENCOUNTER — Ambulatory Visit: Admit: 2022-07-16 | Discharge: 2022-07-17 | Payer: MEDICARE

## 2022-07-16 ENCOUNTER — Ambulatory Visit
Admit: 2022-07-16 | Discharge: 2022-07-17 | Payer: MEDICARE | Attending: Radiation Oncology | Primary: Radiation Oncology

## 2022-08-12 ENCOUNTER — Ambulatory Visit: Admit: 2022-08-12 | Discharge: 2022-08-13 | Payer: MEDICARE

## 2022-09-02 ENCOUNTER — Other Ambulatory Visit: Admit: 2022-09-02 | Discharge: 2022-09-02 | Payer: MEDICARE

## 2022-09-02 ENCOUNTER — Encounter: Admit: 2022-09-02 | Discharge: 2022-09-02 | Payer: MEDICARE | Attending: Nephrology | Primary: Nephrology

## 2022-09-02 ENCOUNTER — Ambulatory Visit: Admit: 2022-09-02 | Discharge: 2022-09-02 | Payer: MEDICARE

## 2022-09-02 DIAGNOSIS — I5030 Unspecified diastolic (congestive) heart failure: Principal | ICD-10-CM

## 2022-09-02 DIAGNOSIS — Z940 Kidney transplant status: Principal | ICD-10-CM

## 2022-09-02 DIAGNOSIS — Z79899 Other long term (current) drug therapy: Principal | ICD-10-CM

## 2022-09-02 DIAGNOSIS — J410 Simple chronic bronchitis: Principal | ICD-10-CM

## 2022-09-02 DIAGNOSIS — R911 Solitary pulmonary nodule: Principal | ICD-10-CM

## 2022-09-02 DIAGNOSIS — D473 Essential (hemorrhagic) thrombocythemia: Principal | ICD-10-CM

## 2022-09-02 DIAGNOSIS — N390 Urinary tract infection, site not specified: Principal | ICD-10-CM

## 2022-09-02 DIAGNOSIS — R0902 Hypoxemia: Principal | ICD-10-CM

## 2022-09-02 DIAGNOSIS — G4733 Obstructive sleep apnea (adult) (pediatric): Principal | ICD-10-CM

## 2022-09-02 DIAGNOSIS — E669 Obesity, unspecified: Principal | ICD-10-CM

## 2022-09-02 DIAGNOSIS — F5101 Primary insomnia: Principal | ICD-10-CM

## 2022-09-02 DIAGNOSIS — E119 Type 2 diabetes mellitus without complications: Principal | ICD-10-CM

## 2022-09-02 MED ORDER — OZEMPIC 0.25 MG OR 0.5 MG (2 MG/3 ML) SUBCUTANEOUS PEN INJECTOR
SUBCUTANEOUS | 0 refills | 28 days | Status: CP
Start: 2022-09-02 — End: 2022-09-24

## 2022-09-16 DIAGNOSIS — Z940 Kidney transplant status: Principal | ICD-10-CM

## 2022-09-16 MED ORDER — MYFORTIC 180 MG TABLET,DELAYED RELEASE
ORAL_TABLET | Freq: Two times a day (BID) | ORAL | 3 refills | 90 days | Status: CP
Start: 2022-09-16 — End: 2023-09-16

## 2022-09-27 DIAGNOSIS — I10 Essential (primary) hypertension: Principal | ICD-10-CM

## 2022-09-27 MED ORDER — JARDIANCE 10 MG TABLET
ORAL_TABLET | Freq: Every day | ORAL | 3 refills | 90 days | Status: CP
Start: 2022-09-27 — End: ?

## 2022-09-27 MED ORDER — PRAVASTATIN 40 MG TABLET
ORAL_TABLET | 0 refills | 0 days | Status: CP
Start: 2022-09-27 — End: ?

## 2022-09-28 DIAGNOSIS — I10 Essential (primary) hypertension: Principal | ICD-10-CM

## 2022-09-28 MED ORDER — PRAVASTATIN 40 MG TABLET
ORAL_TABLET | Freq: Every day | ORAL | 3 refills | 90 days | Status: CP
Start: 2022-09-28 — End: ?

## 2022-10-04 DIAGNOSIS — G4733 Obstructive sleep apnea (adult) (pediatric): Principal | ICD-10-CM

## 2022-10-06 MED ORDER — OZEMPIC 0.25 MG OR 0.5 MG (2 MG/3 ML) SUBCUTANEOUS PEN INJECTOR
SUBCUTANEOUS | 1 refills | 84 days | Status: CP
Start: 2022-10-06 — End: 2023-03-17

## 2022-10-08 MED ORDER — OZEMPIC 0.25 MG OR 0.5 MG (2 MG/3 ML) SUBCUTANEOUS PEN INJECTOR
SUBCUTANEOUS | 1 refills | 168 days | Status: CP
Start: 2022-10-08 — End: 2023-03-19

## 2022-10-10 DIAGNOSIS — R0600 Dyspnea, unspecified: Principal | ICD-10-CM

## 2022-10-10 DIAGNOSIS — I5030 Unspecified diastolic (congestive) heart failure: Principal | ICD-10-CM

## 2022-10-10 DIAGNOSIS — G4733 Obstructive sleep apnea (adult) (pediatric): Principal | ICD-10-CM

## 2022-10-20 DIAGNOSIS — Z940 Kidney transplant status: Principal | ICD-10-CM

## 2022-10-25 ENCOUNTER — Ambulatory Visit: Admit: 2022-10-25 | Payer: MEDICARE | Attending: Adult Health | Primary: Adult Health

## 2022-10-25 ENCOUNTER — Ambulatory Visit: Admit: 2022-10-25 | Payer: MEDICARE

## 2022-11-01 DIAGNOSIS — Z940 Kidney transplant status: Principal | ICD-10-CM

## 2022-11-01 DIAGNOSIS — N1832 Anemia of chronic renal failure, stage 3b (CMS-HCC): Principal | ICD-10-CM

## 2022-11-01 DIAGNOSIS — D631 Anemia in chronic kidney disease: Principal | ICD-10-CM

## 2022-11-01 DIAGNOSIS — Z79899 Other long term (current) drug therapy: Principal | ICD-10-CM

## 2022-11-04 ENCOUNTER — Encounter: Admit: 2022-11-04 | Discharge: 2022-11-04 | Payer: MEDICARE | Attending: Nephrology | Primary: Nephrology

## 2022-11-04 ENCOUNTER — Ambulatory Visit: Admit: 2022-11-04 | Discharge: 2022-11-04 | Payer: MEDICARE | Attending: Nephrology | Primary: Nephrology

## 2022-11-04 ENCOUNTER — Ambulatory Visit: Admit: 2022-11-04 | Discharge: 2022-11-04 | Payer: MEDICARE

## 2022-11-04 DIAGNOSIS — Z940 Kidney transplant status: Principal | ICD-10-CM

## 2022-11-04 DIAGNOSIS — D473 Essential (hemorrhagic) thrombocythemia: Principal | ICD-10-CM

## 2022-11-04 DIAGNOSIS — Z85828 Personal history of other malignant neoplasm of skin: Principal | ICD-10-CM

## 2022-11-04 DIAGNOSIS — Z79899 Other long term (current) drug therapy: Principal | ICD-10-CM

## 2022-11-04 DIAGNOSIS — D631 Anemia in chronic kidney disease: Principal | ICD-10-CM

## 2022-11-04 DIAGNOSIS — N1832 Anemia of chronic renal failure, stage 3b (CMS-HCC): Principal | ICD-10-CM

## 2022-11-04 DIAGNOSIS — D849 Immunodeficiency, unspecified: Principal | ICD-10-CM

## 2022-11-04 DIAGNOSIS — Z48298 Encounter for aftercare following other organ transplant: Principal | ICD-10-CM

## 2022-11-08 DIAGNOSIS — Z940 Kidney transplant status: Principal | ICD-10-CM

## 2022-11-08 DIAGNOSIS — I151 Hypertension secondary to other renal disorders: Principal | ICD-10-CM

## 2022-11-08 DIAGNOSIS — I5030 Unspecified diastolic (congestive) heart failure: Principal | ICD-10-CM

## 2022-11-08 DIAGNOSIS — R059 Cough: Principal | ICD-10-CM

## 2022-11-08 MED ORDER — AMLODIPINE 5 MG TABLET
ORAL_TABLET | Freq: Every day | ORAL | 0 refills | 0 days
Start: 2022-11-08 — End: ?

## 2022-11-08 MED ORDER — PANTOPRAZOLE 40 MG TABLET,DELAYED RELEASE
ORAL_TABLET | Freq: Every day | ORAL | 0 refills | 0 days
Start: 2022-11-08 — End: ?

## 2022-11-08 MED ORDER — METFORMIN 500 MG TABLET
ORAL_TABLET | 0 refills | 0 days
Start: 2022-11-08 — End: ?

## 2022-11-08 MED ORDER — CARVEDILOL 25 MG TABLET
ORAL_TABLET | ORAL | 0 refills | 0 days
Start: 2022-11-08 — End: ?

## 2022-11-08 MED ORDER — SODIUM BICARBONATE 325 MG TABLET
ORAL_TABLET | ORAL | 0 refills | 0 days
Start: 2022-11-08 — End: ?

## 2022-11-08 MED ORDER — EPLERENONE 25 MG TABLET
ORAL_TABLET | Freq: Every day | ORAL | 0 refills | 0 days
Start: 2022-11-08 — End: ?

## 2022-11-08 MED ORDER — LOSARTAN 50 MG TABLET
ORAL_TABLET | Freq: Every day | ORAL | 0 refills | 0 days
Start: 2022-11-08 — End: ?

## 2022-11-08 MED ORDER — ALLOPURINOL 100 MG TABLET
ORAL_TABLET | 0 refills | 0 days
Start: 2022-11-08 — End: ?

## 2022-11-08 MED ORDER — FLUTICASONE PROPIONATE 50 MCG/ACTUATION NASAL SPRAY,SUSPENSION
0 refills | 0 days
Start: 2022-11-08 — End: ?

## 2022-11-08 MED ORDER — CEPHALEXIN 250 MG CAPSULE
ORAL_CAPSULE | Freq: Every day | ORAL | 0 refills | 0 days
Start: 2022-11-08 — End: ?

## 2022-11-09 MED ORDER — AMLODIPINE 5 MG TABLET
ORAL_TABLET | Freq: Every day | ORAL | 0 refills | 90 days | Status: CP
Start: 2022-11-09 — End: ?

## 2022-11-09 MED ORDER — PANTOPRAZOLE 40 MG TABLET,DELAYED RELEASE
ORAL_TABLET | Freq: Every day | ORAL | 0 refills | 90 days | Status: CP
Start: 2022-11-09 — End: ?

## 2022-11-09 MED ORDER — CEPHALEXIN 250 MG CAPSULE
ORAL_CAPSULE | Freq: Every day | ORAL | 0 refills | 90 days | Status: CP
Start: 2022-11-09 — End: ?

## 2022-11-09 MED ORDER — SODIUM BICARBONATE 325 MG TABLET
ORAL_TABLET | ORAL | 0 refills | 0 days | Status: CP
Start: 2022-11-09 — End: ?

## 2022-11-09 MED ORDER — EPLERENONE 25 MG TABLET
ORAL_TABLET | Freq: Every day | ORAL | 0 refills | 90 days | Status: CP
Start: 2022-11-09 — End: ?

## 2022-11-09 MED ORDER — METFORMIN 500 MG TABLET
ORAL_TABLET | 0 refills | 0 days | Status: CP
Start: 2022-11-09 — End: ?

## 2022-11-09 MED ORDER — FLUTICASONE PROPIONATE 50 MCG/ACTUATION NASAL SPRAY,SUSPENSION
0 refills | 0 days | Status: CP
Start: 2022-11-09 — End: ?

## 2022-11-09 MED ORDER — CARVEDILOL 25 MG TABLET
ORAL_TABLET | ORAL | 0 refills | 0 days | Status: CP
Start: 2022-11-09 — End: ?

## 2022-11-09 MED ORDER — LOSARTAN 50 MG TABLET
ORAL_TABLET | Freq: Every day | ORAL | 0 refills | 90 days | Status: CP
Start: 2022-11-09 — End: ?

## 2022-11-09 MED ORDER — ALLOPURINOL 100 MG TABLET
ORAL_TABLET | 0 refills | 0 days | Status: CP
Start: 2022-11-09 — End: ?

## 2022-11-12 ENCOUNTER — Ambulatory Visit: Admit: 2022-11-12 | Discharge: 2022-11-13 | Payer: MEDICARE

## 2022-11-12 DIAGNOSIS — Z940 Kidney transplant status: Principal | ICD-10-CM

## 2022-11-12 DIAGNOSIS — G4733 Obstructive sleep apnea (adult) (pediatric): Principal | ICD-10-CM

## 2022-11-12 DIAGNOSIS — R0600 Dyspnea, unspecified: Principal | ICD-10-CM

## 2022-11-12 DIAGNOSIS — I5030 Unspecified diastolic (congestive) heart failure: Principal | ICD-10-CM

## 2022-11-12 DIAGNOSIS — J449 Chronic obstructive pulmonary disease, unspecified: Principal | ICD-10-CM

## 2022-11-12 DIAGNOSIS — R911 Solitary pulmonary nodule: Principal | ICD-10-CM

## 2022-12-23 DIAGNOSIS — Z940 Kidney transplant status: Principal | ICD-10-CM

## 2022-12-23 DIAGNOSIS — Z79899 Other long term (current) drug therapy: Principal | ICD-10-CM

## 2022-12-23 DIAGNOSIS — D849 Immunodeficiency, unspecified: Principal | ICD-10-CM

## 2022-12-23 MED ORDER — PROGRAF 0.5 MG CAPSULE
ORAL_CAPSULE | ORAL | 11 refills | 0 days | Status: CP
Start: 2022-12-23 — End: ?

## 2022-12-25 DIAGNOSIS — D473 Essential (hemorrhagic) thrombocythemia: Principal | ICD-10-CM

## 2022-12-25 DIAGNOSIS — J449 Chronic obstructive pulmonary disease, unspecified: Principal | ICD-10-CM

## 2022-12-25 MED ORDER — INCRUSE ELLIPTA 62.5 MCG/ACTUATION POWDER FOR INHALATION
RESPIRATORY_TRACT | 0 refills | 0 days
Start: 2022-12-25 — End: ?

## 2022-12-25 MED ORDER — HYDROXYUREA 500 MG CAPSULE
ORAL_CAPSULE | Freq: Every day | ORAL | 0 refills | 0 days
Start: 2022-12-25 — End: ?

## 2022-12-27 MED ORDER — INCRUSE ELLIPTA 62.5 MCG/ACTUATION POWDER FOR INHALATION
RESPIRATORY_TRACT | 3 refills | 0 days | Status: CP
Start: 2022-12-27 — End: ?

## 2022-12-28 MED ORDER — HYDROXYUREA 500 MG CAPSULE
ORAL_CAPSULE | Freq: Every day | ORAL | 0 refills | 90 days | Status: CP
Start: 2022-12-28 — End: ?

## 2022-12-29 ENCOUNTER — Encounter: Admit: 2022-12-29 | Discharge: 2022-12-29 | Payer: MEDICARE | Attending: Nephrology | Primary: Nephrology

## 2022-12-29 ENCOUNTER — Ambulatory Visit: Admit: 2022-12-29 | Discharge: 2022-12-29 | Payer: MEDICARE

## 2022-12-31 ENCOUNTER — Ambulatory Visit: Admit: 2022-12-31 | Discharge: 2022-12-31 | Payer: MEDICARE

## 2022-12-31 ENCOUNTER — Encounter: Admit: 2022-12-31 | Discharge: 2022-12-31 | Payer: MEDICARE

## 2022-12-31 DIAGNOSIS — I151 Hypertension secondary to other renal disorders: Principal | ICD-10-CM

## 2022-12-31 DIAGNOSIS — I5030 Unspecified diastolic (congestive) heart failure: Principal | ICD-10-CM

## 2022-12-31 MED ORDER — EPLERENONE 25 MG TABLET
ORAL_TABLET | Freq: Every day | ORAL | 3 refills | 90 days | Status: CP
Start: 2022-12-31 — End: ?

## 2023-01-11 DIAGNOSIS — N183 Anemia of chronic renal failure, stage 3 (moderate), unspecified whether stage 3a or 3b CKD (CMS-HCC): Principal | ICD-10-CM

## 2023-01-11 DIAGNOSIS — D631 Anemia in chronic kidney disease: Principal | ICD-10-CM

## 2023-01-14 DIAGNOSIS — N183 Anemia of chronic renal failure, stage 3 (moderate), unspecified whether stage 3a or 3b CKD (CMS-HCC): Principal | ICD-10-CM

## 2023-01-14 DIAGNOSIS — D631 Anemia in chronic kidney disease: Principal | ICD-10-CM

## 2023-01-17 ENCOUNTER — Encounter: Admit: 2023-01-17 | Discharge: 2023-01-18 | Payer: MEDICARE

## 2023-01-17 ENCOUNTER — Other Ambulatory Visit: Admit: 2023-01-17 | Discharge: 2023-01-18 | Payer: MEDICARE

## 2023-01-17 ENCOUNTER — Ambulatory Visit: Admit: 2023-01-17 | Discharge: 2023-01-18 | Payer: MEDICARE

## 2023-01-17 DIAGNOSIS — N183 Anemia of chronic renal failure, stage 3 (moderate), unspecified whether stage 3a or 3b CKD (CMS-HCC): Principal | ICD-10-CM

## 2023-01-17 DIAGNOSIS — D631 Anemia in chronic kidney disease: Principal | ICD-10-CM

## 2023-01-18 ENCOUNTER — Ambulatory Visit: Admit: 2023-01-18 | Discharge: 2023-01-19 | Payer: MEDICARE

## 2023-01-18 DIAGNOSIS — Z940 Kidney transplant status: Principal | ICD-10-CM

## 2023-01-18 MED ORDER — MYFORTIC 180 MG TABLET,DELAYED RELEASE
ORAL_TABLET | Freq: Two times a day (BID) | ORAL | 3 refills | 90 days | Status: CP
Start: 2023-01-18 — End: 2024-01-18

## 2023-01-19 ENCOUNTER — Ambulatory Visit: Admit: 2023-01-19 | Discharge: 2023-01-20 | Payer: MEDICARE

## 2023-01-20 DIAGNOSIS — I5030 Unspecified diastolic (congestive) heart failure: Principal | ICD-10-CM

## 2023-01-20 DIAGNOSIS — G4733 Obstructive sleep apnea (adult) (pediatric): Principal | ICD-10-CM

## 2023-01-20 DIAGNOSIS — R0600 Dyspnea, unspecified: Principal | ICD-10-CM

## 2023-01-20 DIAGNOSIS — J449 Chronic obstructive pulmonary disease, unspecified: Principal | ICD-10-CM

## 2023-01-25 DIAGNOSIS — Z1212 Encounter for screening for malignant neoplasm of rectum: Principal | ICD-10-CM

## 2023-01-25 DIAGNOSIS — Z1211 Encounter for screening for malignant neoplasm of colon: Principal | ICD-10-CM

## 2023-01-26 ENCOUNTER — Ambulatory Visit: Admit: 2023-01-26 | Discharge: 2023-01-27 | Payer: MEDICARE

## 2023-01-27 DIAGNOSIS — R0600 Dyspnea, unspecified: Principal | ICD-10-CM

## 2023-01-28 ENCOUNTER — Ambulatory Visit
Admit: 2023-01-28 | Discharge: 2023-01-29 | Payer: MEDICARE | Attending: Radiation Oncology | Primary: Radiation Oncology

## 2023-01-28 ENCOUNTER — Ambulatory Visit: Admit: 2023-01-28 | Discharge: 2023-01-29 | Payer: MEDICARE

## 2023-02-01 ENCOUNTER — Ambulatory Visit: Admit: 2023-02-01 | Discharge: 2023-02-03 | Payer: MEDICARE

## 2023-02-14 ENCOUNTER — Ambulatory Visit: Admit: 2023-02-14 | Payer: MEDICARE

## 2023-02-14 DIAGNOSIS — R059 Cough: Principal | ICD-10-CM

## 2023-02-14 MED ORDER — FLUTICASONE PROPIONATE 50 MCG/ACTUATION NASAL SPRAY,SUSPENSION
0 refills | 0 days | Status: CP
Start: 2023-02-14 — End: ?

## 2023-02-15 DIAGNOSIS — G4733 Obstructive sleep apnea (adult) (pediatric): Principal | ICD-10-CM

## 2023-02-22 DIAGNOSIS — A09 Infectious gastroenteritis and colitis, unspecified: Principal | ICD-10-CM

## 2023-02-22 DIAGNOSIS — Z940 Kidney transplant status: Principal | ICD-10-CM

## 2023-02-23 ENCOUNTER — Ambulatory Visit: Admit: 2023-02-23 | Discharge: 2023-02-23 | Payer: MEDICARE

## 2023-02-23 ENCOUNTER — Encounter: Admit: 2023-02-23 | Discharge: 2023-02-23 | Payer: MEDICARE | Attending: Nephrology | Primary: Nephrology

## 2023-02-23 ENCOUNTER — Institutional Professional Consult (permissible substitution): Admit: 2023-02-23 | Discharge: 2023-02-23 | Payer: MEDICARE

## 2023-02-23 DIAGNOSIS — Z79899 Other long term (current) drug therapy: Principal | ICD-10-CM

## 2023-02-23 DIAGNOSIS — D631 Anemia in chronic kidney disease: Principal | ICD-10-CM

## 2023-02-23 DIAGNOSIS — N1832 Anemia of chronic renal failure, stage 3b (CMS-HCC): Principal | ICD-10-CM

## 2023-02-23 DIAGNOSIS — Z940 Kidney transplant status: Principal | ICD-10-CM

## 2023-02-23 DIAGNOSIS — I151 Hypertension secondary to other renal disorders: Principal | ICD-10-CM

## 2023-02-23 DIAGNOSIS — E860 Dehydration: Principal | ICD-10-CM

## 2023-02-23 DIAGNOSIS — N183 Anemia of chronic renal failure, stage 3 (moderate), unspecified whether stage 3a or 3b CKD (CMS-HCC): Principal | ICD-10-CM

## 2023-02-28 ENCOUNTER — Ambulatory Visit
Admit: 2023-02-28 | Discharge: 2023-03-03 | Disposition: A | Discharge status: Home / Self Care | Payer: MEDICARE | Admitting: Nephrology

## 2023-02-28 ENCOUNTER — Other Ambulatory Visit
Admit: 2023-02-28 | Discharge: 2023-03-03 | Disposition: A | Discharge status: Home / Self Care | Payer: MEDICARE | Admitting: Nephrology

## 2023-02-28 ENCOUNTER — Encounter
Admit: 2023-02-28 | Discharge: 2023-03-03 | Disposition: A | Discharge status: Home / Self Care | Payer: MEDICARE | Admitting: Nephrology

## 2023-02-28 ENCOUNTER — Encounter
Admit: 2023-02-28 | Discharge: 2023-03-03 | Disposition: A | Discharge status: Home / Self Care | Payer: MEDICARE | Attending: Nephrology | Admitting: Nephrology

## 2023-02-28 DIAGNOSIS — Z940 Kidney transplant status: Principal | ICD-10-CM

## 2023-02-28 DIAGNOSIS — D631 Anemia in chronic kidney disease: Principal | ICD-10-CM

## 2023-02-28 DIAGNOSIS — Z79899 Other long term (current) drug therapy: Principal | ICD-10-CM

## 2023-02-28 DIAGNOSIS — N183 Anemia of chronic renal failure, stage 3 (moderate), unspecified whether stage 3a or 3b CKD (CMS-HCC): Principal | ICD-10-CM

## 2023-03-03 DIAGNOSIS — Z940 Kidney transplant status: Principal | ICD-10-CM

## 2023-03-03 DIAGNOSIS — Z79899 Other long term (current) drug therapy: Principal | ICD-10-CM

## 2023-03-03 MED ORDER — PROGRAF 0.5 MG CAPSULE
ORAL_CAPSULE | Freq: Every day | ORAL | 1 refills | 30 days | Status: CP
Start: 2023-03-03 — End: ?

## 2023-03-03 MED ORDER — SODIUM BICARBONATE 650 MG TABLET
ORAL_TABLET | Freq: Three times a day (TID) | ORAL | 0 refills | 30 days | Status: CP
Start: 2023-03-03 — End: 2023-04-02

## 2023-03-07 ENCOUNTER — Encounter: Admit: 2023-03-07 | Discharge: 2023-03-07 | Payer: MEDICARE | Attending: Nephrology | Primary: Nephrology

## 2023-03-07 ENCOUNTER — Ambulatory Visit: Admit: 2023-03-07 | Discharge: 2023-03-08 | Payer: MEDICARE

## 2023-03-07 DIAGNOSIS — D849 Immunodeficiency, unspecified: Principal | ICD-10-CM

## 2023-03-07 DIAGNOSIS — D631 Anemia in chronic kidney disease: Principal | ICD-10-CM

## 2023-03-07 DIAGNOSIS — Z940 Kidney transplant status: Principal | ICD-10-CM

## 2023-03-07 DIAGNOSIS — N1832 Anemia of chronic renal failure, stage 3b (CMS-HCC): Principal | ICD-10-CM

## 2023-03-07 DIAGNOSIS — Z79899 Other long term (current) drug therapy: Principal | ICD-10-CM

## 2023-03-07 MED ORDER — POTASSIUM CHLORIDE ER 20 MEQ TABLET,EXTENDED RELEASE(PART/CRYST)
ORAL_TABLET | Freq: Every day | ORAL | 11 refills | 30 days | Status: CP
Start: 2023-03-07 — End: 2024-03-06

## 2023-03-07 MED ORDER — PROGRAF 0.5 MG CAPSULE
ORAL_CAPSULE | Freq: Two times a day (BID) | ORAL | 1 refills | 30 days | Status: CP
Start: 2023-03-07 — End: ?

## 2023-03-08 ENCOUNTER — Other Ambulatory Visit: Admit: 2023-03-08 | Discharge: 2023-03-09 | Payer: MEDICARE

## 2023-03-08 ENCOUNTER — Encounter: Admit: 2023-03-08 | Discharge: 2023-03-09 | Payer: MEDICARE | Attending: Nephrology | Primary: Nephrology

## 2023-03-08 ENCOUNTER — Encounter: Admit: 2023-03-08 | Discharge: 2023-03-09 | Payer: MEDICARE

## 2023-03-08 ENCOUNTER — Ambulatory Visit: Admit: 2023-03-08 | Discharge: 2023-03-09 | Payer: MEDICARE

## 2023-03-08 DIAGNOSIS — N183 Anemia of chronic renal failure, stage 3 (moderate), unspecified whether stage 3a or 3b CKD (CMS-HCC): Principal | ICD-10-CM

## 2023-03-08 DIAGNOSIS — D631 Anemia in chronic kidney disease: Principal | ICD-10-CM

## 2023-03-08 DIAGNOSIS — N1832 Anemia of chronic renal failure, stage 3b (CMS-HCC): Principal | ICD-10-CM

## 2023-03-08 DIAGNOSIS — Z79899 Other long term (current) drug therapy: Principal | ICD-10-CM

## 2023-03-08 DIAGNOSIS — Z940 Kidney transplant status: Principal | ICD-10-CM

## 2023-03-09 ENCOUNTER — Ambulatory Visit: Admit: 2023-03-09 | Payer: MEDICARE

## 2023-03-09 ENCOUNTER — Ambulatory Visit: Admit: 2023-03-09 | Payer: MEDICARE | Attending: Adult Health | Primary: Adult Health

## 2023-03-10 ENCOUNTER — Ambulatory Visit: Admit: 2023-03-10 | Discharge: 2023-03-11 | Payer: MEDICARE | Attending: Nephrology | Primary: Nephrology

## 2023-03-10 DIAGNOSIS — R0981 Nasal congestion: Principal | ICD-10-CM

## 2023-03-10 DIAGNOSIS — Z940 Kidney transplant status: Principal | ICD-10-CM

## 2023-03-10 MED ORDER — SODIUM BICARBONATE 650 MG TABLET
ORAL_TABLET | Freq: Two times a day (BID) | ORAL | 3 refills | 90.00000 days | Status: CP
Start: 2023-03-10 — End: 2023-04-09

## 2023-03-18 ENCOUNTER — Ambulatory Visit: Admit: 2023-03-18 | Discharge: 2023-03-18 | Payer: MEDICARE

## 2023-03-18 ENCOUNTER — Encounter: Admit: 2023-03-18 | Discharge: 2023-03-18 | Payer: MEDICARE | Attending: Nephrology | Primary: Nephrology

## 2023-03-21 DIAGNOSIS — D473 Essential (hemorrhagic) thrombocythemia: Principal | ICD-10-CM

## 2023-03-22 DIAGNOSIS — Z940 Kidney transplant status: Principal | ICD-10-CM

## 2023-03-22 MED ORDER — MYFORTIC 180 MG TABLET,DELAYED RELEASE
ORAL_TABLET | Freq: Two times a day (BID) | ORAL | 11 refills | 30 days | Status: CP
Start: 2023-03-22 — End: 2024-03-21

## 2023-03-23 ENCOUNTER — Ambulatory Visit: Admit: 2023-03-23 | Discharge: 2023-03-24 | Payer: MEDICARE | Attending: Adult Health | Primary: Adult Health

## 2023-03-23 DIAGNOSIS — D631 Anemia in chronic kidney disease: Principal | ICD-10-CM

## 2023-03-23 DIAGNOSIS — D473 Essential (hemorrhagic) thrombocythemia: Principal | ICD-10-CM

## 2023-03-23 DIAGNOSIS — N183 Anemia of chronic renal failure, stage 3 (moderate), unspecified whether stage 3a or 3b CKD (CMS-HCC): Principal | ICD-10-CM

## 2023-03-25 DIAGNOSIS — Z940 Kidney transplant status: Principal | ICD-10-CM

## 2023-03-25 MED ORDER — LOSARTAN 50 MG TABLET
ORAL_TABLET | Freq: Every day | ORAL | 0 refills | 0 days
Start: 2023-03-25 — End: ?

## 2023-03-25 MED ORDER — CEPHALEXIN 250 MG CAPSULE
ORAL_CAPSULE | Freq: Every day | ORAL | 0 refills | 0 days
Start: 2023-03-25 — End: ?

## 2023-03-25 MED ORDER — AMLODIPINE 5 MG TABLET
ORAL_TABLET | Freq: Every day | ORAL | 0 refills | 0 days
Start: 2023-03-25 — End: ?

## 2023-03-25 MED ORDER — ALLOPURINOL 100 MG TABLET
ORAL_TABLET | 0 refills | 0 days
Start: 2023-03-25 — End: ?

## 2023-03-25 MED ORDER — CARVEDILOL 25 MG TABLET
ORAL_TABLET | ORAL | 0 refills | 0 days
Start: 2023-03-25 — End: ?

## 2023-03-25 MED ORDER — PANTOPRAZOLE 40 MG TABLET,DELAYED RELEASE
ORAL_TABLET | Freq: Every day | ORAL | 0 refills | 0 days
Start: 2023-03-25 — End: ?

## 2023-03-25 MED ORDER — FUROSEMIDE 40 MG TABLET
ORAL_TABLET | Freq: Every day | ORAL | 0 refills | 0 days
Start: 2023-03-25 — End: ?

## 2023-03-27 MED ORDER — FUROSEMIDE 40 MG TABLET
ORAL_TABLET | Freq: Every day | ORAL | 0 refills | 90 days | Status: CP
Start: 2023-03-27 — End: ?

## 2023-03-27 MED ORDER — LOSARTAN 50 MG TABLET
ORAL | 0 refills | 90 days | Status: CP
Start: 2023-03-27 — End: ?

## 2023-03-27 MED ORDER — PANTOPRAZOLE 40 MG TABLET,DELAYED RELEASE
ORAL_TABLET | Freq: Every day | ORAL | 0 refills | 90 days | Status: CP
Start: 2023-03-27 — End: ?

## 2023-03-27 MED ORDER — CARVEDILOL 25 MG TABLET
ORAL_TABLET | ORAL | 0 refills | 0 days | Status: CP
Start: 2023-03-27 — End: ?

## 2023-03-27 MED ORDER — ALLOPURINOL 100 MG TABLET
ORAL_TABLET | 0 refills | 0 days | Status: CP
Start: 2023-03-27 — End: ?

## 2023-03-27 MED ORDER — AMLODIPINE 5 MG TABLET
ORAL | 0 refills | 90 days | Status: CP
Start: 2023-03-27 — End: ?

## 2023-03-27 MED ORDER — CEPHALEXIN 250 MG CAPSULE
ORAL_CAPSULE | Freq: Every day | ORAL | 0 refills | 90 days | Status: CP
Start: 2023-03-27 — End: ?

## 2023-03-28 ENCOUNTER — Encounter: Admit: 2023-03-28 | Discharge: 2023-03-29 | Payer: MEDICARE

## 2023-03-28 ENCOUNTER — Ambulatory Visit: Admit: 2023-03-28 | Discharge: 2023-03-29 | Payer: MEDICARE

## 2023-03-28 DIAGNOSIS — Z79899 Other long term (current) drug therapy: Principal | ICD-10-CM

## 2023-03-28 DIAGNOSIS — Z940 Kidney transplant status: Principal | ICD-10-CM

## 2023-03-29 DIAGNOSIS — Z940 Kidney transplant status: Principal | ICD-10-CM

## 2023-03-29 DIAGNOSIS — D473 Essential (hemorrhagic) thrombocythemia: Principal | ICD-10-CM

## 2023-03-29 MED ORDER — MYFORTIC 180 MG TABLET,DELAYED RELEASE
ORAL_TABLET | Freq: Two times a day (BID) | ORAL | 11 refills | 30 days | Status: CP
Start: 2023-03-29 — End: 2024-03-28

## 2023-03-29 MED ORDER — HYDROXYUREA 500 MG CAPSULE
ORAL_CAPSULE | Freq: Every day | ORAL | 0 refills | 0 days
Start: 2023-03-29 — End: ?

## 2023-03-29 MED ORDER — POTASSIUM CHLORIDE ER 20 MEQ TABLET,EXTENDED RELEASE(PART/CRYST)
ORAL_TABLET | Freq: Every day | ORAL | 3 refills | 90 days | Status: CP
Start: 2023-03-29 — End: 2024-03-28

## 2023-03-29 MED ORDER — METFORMIN 500 MG TABLET
ORAL_TABLET | Freq: Two times a day (BID) | ORAL | 3 refills | 90 days | Status: CP
Start: 2023-03-29 — End: ?

## 2023-03-30 MED ORDER — HYDROXYUREA 500 MG CAPSULE
ORAL | 1 refills | 90 days | Status: CP
Start: 2023-03-30 — End: ?

## 2023-04-01 DIAGNOSIS — N183 Anemia of chronic renal failure, stage 3 (moderate), unspecified whether stage 3a or 3b CKD (CMS-HCC): Principal | ICD-10-CM

## 2023-04-01 DIAGNOSIS — D631 Anemia in chronic kidney disease: Principal | ICD-10-CM

## 2023-04-11 DIAGNOSIS — G4733 Obstructive sleep apnea (adult) (pediatric): Principal | ICD-10-CM

## 2023-04-14 ENCOUNTER — Ambulatory Visit: Admit: 2023-04-14 | Discharge: 2023-04-15 | Payer: MEDICARE

## 2023-04-14 DIAGNOSIS — D473 Essential (hemorrhagic) thrombocythemia: Principal | ICD-10-CM

## 2023-04-14 DIAGNOSIS — Z940 Kidney transplant status: Principal | ICD-10-CM

## 2023-04-14 DIAGNOSIS — L989 Disorder of the skin and subcutaneous tissue, unspecified: Principal | ICD-10-CM

## 2023-04-14 DIAGNOSIS — I5030 Unspecified diastolic (congestive) heart failure: Principal | ICD-10-CM

## 2023-04-14 DIAGNOSIS — G4733 Obstructive sleep apnea (adult) (pediatric): Principal | ICD-10-CM

## 2023-04-14 DIAGNOSIS — N390 Urinary tract infection, site not specified: Principal | ICD-10-CM

## 2023-04-14 DIAGNOSIS — E119 Type 2 diabetes mellitus without complications: Principal | ICD-10-CM

## 2023-04-14 DIAGNOSIS — J410 Simple chronic bronchitis: Principal | ICD-10-CM

## 2023-04-15 ENCOUNTER — Encounter: Admit: 2023-04-15 | Discharge: 2023-04-15 | Payer: MEDICARE | Attending: Nephrology | Primary: Nephrology

## 2023-04-15 ENCOUNTER — Ambulatory Visit: Admit: 2023-04-15 | Discharge: 2023-04-15 | Payer: MEDICARE

## 2023-05-02 ENCOUNTER — Ambulatory Visit: Admit: 2023-05-02 | Payer: MEDICARE

## 2023-05-09 ENCOUNTER — Ambulatory Visit: Admit: 2023-05-09 | Discharge: 2023-05-10 | Payer: MEDICARE

## 2023-05-09 ENCOUNTER — Encounter: Admit: 2023-05-09 | Discharge: 2023-05-10 | Payer: MEDICARE | Attending: Nephrology | Primary: Nephrology

## 2023-05-09 ENCOUNTER — Encounter: Admit: 2023-05-09 | Discharge: 2023-05-10 | Payer: MEDICARE

## 2023-05-09 ENCOUNTER — Other Ambulatory Visit: Admit: 2023-05-09 | Discharge: 2023-05-10 | Payer: MEDICARE

## 2023-05-09 ENCOUNTER — Encounter: Admit: 2023-05-09 | Discharge: 2023-05-10 | Payer: MEDICARE | Attending: Adult Health | Primary: Adult Health

## 2023-05-09 DIAGNOSIS — N183 Anemia of chronic renal failure, stage 3 (moderate), unspecified whether stage 3a or 3b CKD (CMS-HCC): Principal | ICD-10-CM

## 2023-05-09 DIAGNOSIS — Z79899 Other long term (current) drug therapy: Principal | ICD-10-CM

## 2023-05-09 DIAGNOSIS — Z940 Kidney transplant status: Principal | ICD-10-CM

## 2023-05-09 DIAGNOSIS — D631 Anemia in chronic kidney disease: Principal | ICD-10-CM

## 2023-05-16 ENCOUNTER — Ambulatory Visit: Admit: 2023-05-16 | Discharge: 2023-05-16 | Payer: MEDICARE | Attending: Internal Medicine | Primary: Internal Medicine

## 2023-05-16 ENCOUNTER — Encounter: Admit: 2023-05-16 | Discharge: 2023-05-16 | Payer: MEDICARE | Attending: Nephrology | Primary: Nephrology

## 2023-05-16 ENCOUNTER — Other Ambulatory Visit: Admit: 2023-05-16 | Discharge: 2023-05-16 | Payer: MEDICARE

## 2023-05-16 DIAGNOSIS — D473 Essential (hemorrhagic) thrombocythemia: Principal | ICD-10-CM

## 2023-05-16 DIAGNOSIS — I272 Pulmonary hypertension, unspecified: Principal | ICD-10-CM

## 2023-05-16 DIAGNOSIS — Z940 Kidney transplant status: Principal | ICD-10-CM

## 2023-05-16 DIAGNOSIS — N1832 Anemia of chronic renal failure, stage 3b (CMS-HCC): Principal | ICD-10-CM

## 2023-05-16 DIAGNOSIS — D631 Anemia in chronic kidney disease: Principal | ICD-10-CM

## 2023-05-16 DIAGNOSIS — N183 Anemia of chronic renal failure, stage 3 (moderate), unspecified whether stage 3a or 3b CKD (CMS-HCC): Principal | ICD-10-CM

## 2023-05-16 DIAGNOSIS — Z79899 Other long term (current) drug therapy: Principal | ICD-10-CM

## 2023-05-16 MED ORDER — PACRITINIB 100 MG CAPSULE
ORAL_CAPSULE | Freq: Two times a day (BID) | ORAL | 0 refills | 30 days | Status: CP
Start: 2023-05-16 — End: ?

## 2023-05-17 ENCOUNTER — Encounter: Admit: 2023-05-17 | Discharge: 2023-05-17 | Payer: MEDICARE | Attending: Adult Health | Primary: Adult Health

## 2023-05-18 ENCOUNTER — Other Ambulatory Visit: Admit: 2023-05-18 | Discharge: 2023-05-19 | Payer: MEDICARE

## 2023-05-18 ENCOUNTER — Ambulatory Visit: Admit: 2023-05-18 | Discharge: 2023-05-19 | Payer: MEDICARE

## 2023-05-18 ENCOUNTER — Encounter: Admit: 2023-05-18 | Discharge: 2023-05-19 | Payer: MEDICARE

## 2023-05-18 DIAGNOSIS — D631 Anemia in chronic kidney disease: Principal | ICD-10-CM

## 2023-05-18 DIAGNOSIS — N183 Anemia of chronic renal failure, stage 3 (moderate), unspecified whether stage 3a or 3b CKD (CMS-HCC): Principal | ICD-10-CM

## 2023-05-18 DIAGNOSIS — D473 Essential (hemorrhagic) thrombocythemia: Principal | ICD-10-CM

## 2023-05-20 ENCOUNTER — Ambulatory Visit: Admit: 2023-05-20 | Discharge: 2023-05-20 | Payer: MEDICARE

## 2023-05-20 DIAGNOSIS — R0600 Dyspnea, unspecified: Principal | ICD-10-CM

## 2023-05-20 DIAGNOSIS — G4733 Obstructive sleep apnea (adult) (pediatric): Principal | ICD-10-CM

## 2023-05-20 DIAGNOSIS — I5030 Unspecified diastolic (congestive) heart failure: Principal | ICD-10-CM

## 2023-05-20 DIAGNOSIS — J449 Chronic obstructive pulmonary disease, unspecified: Principal | ICD-10-CM

## 2023-05-23 ENCOUNTER — Ambulatory Visit: Admit: 2023-05-23 | Payer: MEDICARE

## 2023-05-25 DIAGNOSIS — Z79899 Other long term (current) drug therapy: Principal | ICD-10-CM

## 2023-05-25 DIAGNOSIS — Z940 Kidney transplant status: Principal | ICD-10-CM

## 2023-05-26 ENCOUNTER — Ambulatory Visit: Admit: 2023-05-26 | Discharge: 2023-05-27 | Payer: MEDICARE | Attending: Nephrology | Primary: Nephrology

## 2023-05-26 DIAGNOSIS — Z940 Kidney transplant status: Principal | ICD-10-CM

## 2023-05-26 DIAGNOSIS — Z79899 Other long term (current) drug therapy: Principal | ICD-10-CM

## 2023-05-27 DIAGNOSIS — D631 Anemia in chronic kidney disease: Principal | ICD-10-CM

## 2023-05-27 DIAGNOSIS — N1832 Anemia of chronic renal failure, stage 3b (CMS-HCC): Principal | ICD-10-CM

## 2023-05-27 MED ORDER — EPOETIN ALFA 40,000 UNIT/ML INJECTION SOLUTION
SUBCUTANEOUS | 11 refills | 28 days | Status: CP
Start: 2023-05-27 — End: 2024-05-26

## 2023-05-31 MED ORDER — FUROSEMIDE 40 MG TABLET
ORAL_TABLET | Freq: Two times a day (BID) | ORAL | 3 refills | 90 days | Status: CP
Start: 2023-05-31 — End: 2024-05-30

## 2023-06-03 DIAGNOSIS — N1832 Anemia of chronic renal failure, stage 3b (CMS-HCC): Principal | ICD-10-CM

## 2023-06-03 DIAGNOSIS — D631 Anemia in chronic kidney disease: Principal | ICD-10-CM

## 2023-06-06 ENCOUNTER — Ambulatory Visit: Admit: 2023-06-06 | Discharge: 2023-06-07 | Payer: MEDICARE

## 2023-06-06 ENCOUNTER — Other Ambulatory Visit: Admit: 2023-06-06 | Discharge: 2023-06-07 | Payer: MEDICARE

## 2023-06-06 DIAGNOSIS — Z940 Kidney transplant status: Principal | ICD-10-CM

## 2023-06-06 DIAGNOSIS — G4733 Obstructive sleep apnea (adult) (pediatric): Principal | ICD-10-CM

## 2023-06-06 DIAGNOSIS — J449 Chronic obstructive pulmonary disease, unspecified: Principal | ICD-10-CM

## 2023-06-06 DIAGNOSIS — R0600 Dyspnea, unspecified: Principal | ICD-10-CM

## 2023-06-06 DIAGNOSIS — N183 Anemia of chronic renal failure, stage 3 (moderate), unspecified whether stage 3a or 3b CKD (CMS-HCC): Principal | ICD-10-CM

## 2023-06-06 DIAGNOSIS — Z79899 Other long term (current) drug therapy: Principal | ICD-10-CM

## 2023-06-06 DIAGNOSIS — I5030 Unspecified diastolic (congestive) heart failure: Principal | ICD-10-CM

## 2023-06-06 DIAGNOSIS — D631 Anemia in chronic kidney disease: Principal | ICD-10-CM

## 2023-06-07 DIAGNOSIS — N1832 Anemia of chronic renal failure, stage 3b (CMS-HCC): Principal | ICD-10-CM

## 2023-06-07 DIAGNOSIS — R059 Cough: Principal | ICD-10-CM

## 2023-06-07 DIAGNOSIS — I5030 Unspecified diastolic (congestive) heart failure: Principal | ICD-10-CM

## 2023-06-07 DIAGNOSIS — D631 Anemia in chronic kidney disease: Principal | ICD-10-CM

## 2023-06-07 MED ORDER — TORSEMIDE 20 MG TABLET
ORAL_TABLET | Freq: Every day | ORAL | 6 refills | 30 days | Status: CP
Start: 2023-06-07 — End: ?

## 2023-06-07 MED ORDER — FLUTICASONE PROPIONATE 50 MCG/ACTUATION NASAL SPRAY,SUSPENSION
11 refills | 0 days | Status: CP
Start: 2023-06-07 — End: ?

## 2023-06-14 DIAGNOSIS — D631 Anemia in chronic kidney disease: Principal | ICD-10-CM

## 2023-06-14 DIAGNOSIS — N183 Anemia of chronic renal failure, stage 3 (moderate), unspecified whether stage 3a or 3b CKD (CMS-HCC): Principal | ICD-10-CM

## 2023-06-20 ENCOUNTER — Other Ambulatory Visit: Admit: 2023-06-20 | Discharge: 2023-06-21 | Payer: MEDICARE

## 2023-06-20 ENCOUNTER — Encounter: Admit: 2023-06-20 | Discharge: 2023-06-21 | Payer: MEDICARE | Attending: Internal Medicine | Primary: Internal Medicine

## 2023-06-20 ENCOUNTER — Ambulatory Visit: Admit: 2023-06-20 | Discharge: 2023-06-21 | Payer: MEDICARE

## 2023-06-20 DIAGNOSIS — Z940 Kidney transplant status: Principal | ICD-10-CM

## 2023-06-20 DIAGNOSIS — Z79899 Other long term (current) drug therapy: Principal | ICD-10-CM

## 2023-06-20 DIAGNOSIS — D631 Anemia in chronic kidney disease: Principal | ICD-10-CM

## 2023-06-20 DIAGNOSIS — N1832 Anemia of chronic renal failure, stage 3b (CMS-HCC): Principal | ICD-10-CM

## 2023-06-21 ENCOUNTER — Ambulatory Visit: Admit: 2023-06-21 | Discharge: 2023-06-22 | Payer: MEDICARE

## 2023-06-21 DIAGNOSIS — L821 Other seborrheic keratosis: Principal | ICD-10-CM

## 2023-06-21 DIAGNOSIS — Z85828 Personal history of other malignant neoplasm of skin: Principal | ICD-10-CM

## 2023-06-21 DIAGNOSIS — L578 Other skin changes due to chronic exposure to nonionizing radiation: Principal | ICD-10-CM

## 2023-06-21 DIAGNOSIS — D492 Neoplasm of unspecified behavior of bone, soft tissue, and skin: Principal | ICD-10-CM

## 2023-06-22 DIAGNOSIS — Z940 Kidney transplant status: Principal | ICD-10-CM

## 2023-06-22 MED ORDER — CARVEDILOL 25 MG TABLET
ORAL_TABLET | ORAL | 0 refills | 0.00 days
Start: 2023-06-22 — End: ?

## 2023-06-22 MED ORDER — LOSARTAN 50 MG TABLET
ORAL_TABLET | Freq: Every day | ORAL | 0 refills | 0.00 days
Start: 2023-06-22 — End: ?

## 2023-06-22 MED ORDER — AMLODIPINE 5 MG TABLET
ORAL_TABLET | Freq: Every day | ORAL | 0 refills | 0.00 days
Start: 2023-06-22 — End: ?

## 2023-06-22 MED ORDER — ALLOPURINOL 100 MG TABLET
ORAL_TABLET | 0 refills | 0.00 days
Start: 2023-06-22 — End: ?

## 2023-06-22 MED ORDER — CEPHALEXIN 250 MG CAPSULE
ORAL_CAPSULE | Freq: Every day | ORAL | 0 refills | 0.00 days
Start: 2023-06-22 — End: ?

## 2023-06-22 MED ORDER — PANTOPRAZOLE 40 MG TABLET,DELAYED RELEASE
ORAL_TABLET | Freq: Every day | ORAL | 0 refills | 0.00 days
Start: 2023-06-22 — End: ?

## 2023-06-23 MED ORDER — LOSARTAN 50 MG TABLET
ORAL_TABLET | Freq: Every day | ORAL | 0 refills | 90 days | Status: CP
Start: 2023-06-23 — End: ?

## 2023-06-23 MED ORDER — ALLOPURINOL 100 MG TABLET
ORAL_TABLET | 0 refills | 0.00 days | Status: CP
Start: 2023-06-23 — End: ?

## 2023-06-23 MED ORDER — AMLODIPINE 5 MG TABLET
ORAL_TABLET | Freq: Every day | ORAL | 0 refills | 90 days | Status: CP
Start: 2023-06-23 — End: ?

## 2023-06-23 MED ORDER — CEPHALEXIN 250 MG CAPSULE
ORAL_CAPSULE | Freq: Every day | ORAL | 0 refills | 90.00 days | Status: CP
Start: 2023-06-23 — End: ?

## 2023-06-23 MED ORDER — PANTOPRAZOLE 40 MG TABLET,DELAYED RELEASE
ORAL_TABLET | Freq: Every day | ORAL | 0 refills | 90.00 days | Status: CP
Start: 2023-06-23 — End: ?

## 2023-06-23 MED ORDER — CARVEDILOL 25 MG TABLET
ORAL_TABLET | ORAL | 0 refills | 0.00 days | Status: CP
Start: 2023-06-23 — End: ?

## 2023-06-27 DIAGNOSIS — C44311 Basal cell carcinoma of skin of nose: Principal | ICD-10-CM

## 2023-06-27 DIAGNOSIS — D044 Carcinoma in situ of skin of scalp and neck: Principal | ICD-10-CM

## 2023-07-01 ENCOUNTER — Ambulatory Visit: Admit: 2023-07-01 | Discharge: 2023-07-02 | Payer: MEDICARE

## 2023-07-15 DIAGNOSIS — D631 Anemia in chronic kidney disease: Principal | ICD-10-CM

## 2023-07-15 DIAGNOSIS — N183 Anemia of chronic renal failure, stage 3 (moderate), unspecified whether stage 3a or 3b CKD (CMS-HCC): Principal | ICD-10-CM

## 2023-07-15 NOTE — Unmapped (Signed)
Radiation Oncology Follow Up Visit Note    Patient Name: Erin Welch  Patient Age: 73 y.o.  Encounter Date: 07/22/2023    Referring Physician:   Referring, Info Unavailable  No address on file    Primary Care Provider:  Mack Guise, MD    Diagnoses:  1. Lung nodule        Treatment Site: RML, 1800cGy x 3 fractions = 5400cGy, completed 06/19/21     Interval Since Completion of Treatment: 2 years and 1 month      Assessment: 73 yo  former smoker with history of renal transplant (03/2013) maintained on tacrolimus, essential thrombocytopenia diagnosed 05/2003 and managed by Dr Porfirio Oar, pulmonary hypertension with left heart disease managed by Dr Ala Dach, COPD, DM2, and stage 1 lung cancer s/p XRT 06/19/2021.  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    CT Chest today was personally reviewed and reviewed and discussed with Dr Wyline Mood, demonstrating stable findings.      Plan:     -Disease Status: No evidence of progression on CT scan from today.        -Follow-up: 6 months with repeat CT chest  Plan for Mohs surgery early May 2025      Interval History:  Erin Welch returns today for a regularly scheduled follow-up, she was last seen in clinic in July.       States she is dependent on 2 L O2 [place]. Began using it August 2024 after she was hospitalized for hypokalemia. Unclear why she became O2 dependent after. Feels like she couldn't walk around a block. Baseline cough still present.     Review of Systems: All other systems reviewed are negative. Pertinent positives and negatives are above in interval history.    Past Medical, Surgical, Family and Social Histories reviewed and updated in the electronic medical record.    Hematology/Oncology History   Essential thrombocytosis (CMS-HCC)   05/13/2003 Initial Diagnosis    Diagnosis = Essential thrombocythemia, 05/2003.  Dr. Sherrlyn Hock.   During a well-woman evaluation, patient was noted to have incidental thrombocytosis, reportedly to 2.2 million.  She was started on hydroxyurea + anagrelide and in ~2007 she developed anemia with low haptoglobin with concern for hemolysis though no clear source was found.  She was ultimately found to have multiple small bowel AVMs and required IV iron replacement and blood transfusions to manage.  She had genetic HHT evaluation due to the AVMs at [facility] which was negative in 2007.      Bone marrow biopsy = not done    Driver mutation = Not known.  JAK2 [id number]-negative  Additional Myeloid Mutations = Not known.   Karyotype = Not known.     Thrombosis Risk =      05/13/2003 -  Chemotherapy    HYDROXYUREA + ANAGRELIDE  - started due to platelets of 2.2 million.    - unclear when stopped anagrelide     07/12/2012 -  Chemotherapy    HYDROXYUREA  Dropped to single agent hydroxyurea  (+) transfusion-dependent anemia, received pRBCs on:  --> 05/06/20 (2)  --> 06/09/20 (2)  Started on darbepoietin alfa (Aranesp) on 05/06/20    08/01/2020: WHOLE BODY PET-CT  - Mildly hypermetabolic right middle lobe nodule in the lateral segment as described above. Cannot exclude a low metabolic rate malignancy.      -Kidney transplant is noted in the  left pelvis without abnormality noted. No obstruction is detected.      -Mild to moderate cardiomegaly is noted with small to moderate size pericardial effusion. This was not noted on preceding exams.     08/26/20: 1000mg  daily alternating with 500mg  daily  WBC 4.3. ANC 3.6. Hgb 9.8. MCV 124. Plts 542.     09/29/2020: Seen by Porfirio Oar, MD - [facility] (first visit)    [facility]x 10/08/2020:  Markedly hypercellular bone marrow (70%) with features compatible with reported history of Essential Thrombocytosis and 4% blasts by manual aspirate differential (see Comment)  -  Mild and focally moderate reticulin fibrosis (MF1-2).  - Driver Mutation = CALR, Type 1. VAF 36%   - Additional Myeloid Mutations = not detected.   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 anc 3.7. Hgb 10.0. MCV 124. Plts 681.     11/09/2021: Seen by B. Reeves in follow up.   She continues on hydroyxurea, now back to 1000mg  po daily. She completed radiation therapy for lung cancer. Although she had been transfusion-dependent of red blood cells not responsive to ESAs and there was consideration for danazol, since starting Jardiance (SGL2 inhibitor) for diabetes, her hemoglobin has increased and she has not required a blood transfusion since 05/11/2021.   WBC 4.7 with normal differential save for lymphopenia (ALC 0.3), hgb 10.7 g/dL with MCV 161 (from hydroxyurea), and platelets well-controlled at 552     04/13/2022: Seen by Shaune Pascal in follow up.   Continues on hydroxyurea 500mg  po BID (16 tablets/week)  Hgb 11.6 g/dL.  No transfusions.     09/02/2022: Hgb 12.0 g/dL  0/96/04: Hgb 54.0 g/dL  9/81/19: Hgb 8.9 g/dL  --> 1/47/82: 1U PRBC  02/23/23: Hgb 9.9 g/dL  --> 9/56/21: 1U PRBC  03/07/23: hgb 7.3 g/dL  --> 09/17/63: 2U PRBC  03/23/23: seen by Suezanne Cheshire in follow up.   --> continue HU 500mg  po BID (16 pills/wk)  03/28/23: Hgb 9.7 g/L.  Plts 1091.   --> increased HU to 17 pills/wk per Dr. Marcheta Grammes  05/09/23: Hgb 9.2 g/dL.  Plts 708. WBC 3.2.      05/09/2023 Biopsy    BONE MARROW BIOPSY  Indication = worsening anemia  @[facility]     -  Hypercellular bone marrow (60%) with features compatible with reported history of Essential Thrombocytosis and 3% blasts by manual aspirate differential (see Comment)  -  Mild reticulin fibrosis and no significant collagen fibrosis (MF-1)    Peripheral blood:   WBC 3.2, ANC 2.6, Hgb 9.2, MCV 1259, Plts 708.   No leukoerythroblastsosis    No LDH from 05/09/23, but from 03/08/23: 365 (ULN 246).            Physical Exam:  Vital Signs for this encounter:  There were no vitals taken for this visit.  Last weight:    Wt Readings from Last 4 Encounters:   05/26/23 88 kg (194 lb)   05/20/23 86.6 kg (191 lb)   05/16/23 87 kg (191 lb 12.8 oz) 04/14/23 84.8 kg (187 lb)     Karnofsky/Lansky Performance Status: 70, Cares for self; unable to carry on normal activity or to do active work (ECOG equivalent 1)  General:   No acute distress, alert and oriented X 4   HEENT:  Normocephalic and atraumatic.  Neck/lymphatics:  Supple.  Cardiovascular:  Extremities warm and well perfused.  Respiratory:  Breathing is non-labored.  Extremities:  No cyanosis or edema.  Neuro:  Alert and oriented x4.      Radiology  Post radiation treated nodule in the right middle lobe without evidence of recurrence or new thoracic metastasis.       Electronically signed by:    Raliegh Ip, MD  Radiation Oncology PGY-5  [facility] [place]  07/22/2023

## 2023-07-18 NOTE — Unmapped (Signed)
[facility] Specialty Medication Onboarding    Specialty Medication: [place]  Prior Authorization: Approved   Financial Assistance: No - MAPs has reached maximum number of attempts to obtain necessary information from the patient in regards to the financial assistance process  Final Copay/Day Supply: $2000 / 30    Insurance Restrictions: Yes - max 1 month supply     Notes to Pharmacist:   Credit Card on File: no    The triage team has completed the benefits investigation and has determined that the patient is able to fill this medication at [facility]. Please contact the patient to complete the onboarding or follow up with the prescribing physician as needed.

## 2023-07-20 ENCOUNTER — Inpatient Hospital Stay: Admit: 2023-07-20 | Discharge: 2023-07-23 | Payer: MEDICARE

## 2023-07-20 ENCOUNTER — Other Ambulatory Visit: Admit: 2023-07-20 | Discharge: 2023-07-21 | Payer: MEDICARE

## 2023-07-20 ENCOUNTER — Ambulatory Visit: Admit: 2023-07-20 | Discharge: 2023-07-27 | Payer: MEDICARE

## 2023-07-20 ENCOUNTER — Inpatient Hospital Stay
Admit: 2023-07-20 | Discharge: 2023-07-23 | Payer: MEDICARE | Attending: Radiation Oncology | Primary: Radiation Oncology

## 2023-07-20 ENCOUNTER — Ambulatory Visit: Admit: 2023-07-20 | Discharge: 2023-08-12 | Payer: MEDICARE

## 2023-07-20 ENCOUNTER — Ambulatory Visit: Admit: 2023-07-20 | Discharge: 2023-07-21 | Payer: MEDICARE | Attending: Adult Health | Primary: Adult Health

## 2023-07-20 DIAGNOSIS — D473 Essential (hemorrhagic) thrombocythemia: Principal | ICD-10-CM

## 2023-07-20 DIAGNOSIS — I5030 Unspecified diastolic (congestive) heart failure: Principal | ICD-10-CM

## 2023-07-20 DIAGNOSIS — D631 Anemia in chronic kidney disease: Principal | ICD-10-CM

## 2023-07-20 DIAGNOSIS — Z79899 Other long term (current) drug therapy: Principal | ICD-10-CM

## 2023-07-20 DIAGNOSIS — N1832 Anemia of chronic renal failure, stage 3b (CMS-HCC): Principal | ICD-10-CM

## 2023-07-20 DIAGNOSIS — Z940 Kidney transplant status: Principal | ICD-10-CM

## 2023-07-20 LAB — SLIDE REVIEW

## 2023-07-20 LAB — COMPREHENSIVE METABOLIC PANEL
ALBUMIN: 3.6 g/dL (ref 3.4–5.0)
ALKALINE PHOSPHATASE: 50 U/L (ref 46–116)
ALT (SGPT): <7 U/L — ABNORMAL LOW (ref 10–49)
ANION GAP: 11 mmol/L (ref 5–14)
AST (SGOT): 16 U/L (ref <=34)
BILIRUBIN TOTAL: 0.5 mg/dL (ref 0.3–1.2)
BLOOD UREA NITROGEN: 50 mg/dL — ABNORMAL HIGH (ref 9–23)
BUN / CREAT RATIO: 32
CALCIUM: 9.5 mg/dL (ref 8.7–10.4)
CHLORIDE: 103 mmol/L (ref 98–107)
CO2: 26 mmol/L (ref 20.0–31.0)
CREATININE: 1.57 mg/dL — ABNORMAL HIGH (ref 0.55–1.02)
EGFR CKD-EPI (2021) FEMALE: 34 mL/min/{1.73_m2} — ABNORMAL LOW (ref >=60)
GLUCOSE RANDOM: 203 mg/dL — ABNORMAL HIGH (ref 70–179)
POTASSIUM: 4 mmol/L (ref 3.4–4.8)
PROTEIN TOTAL: 6.5 g/dL (ref 5.7–8.2)
SODIUM: 140 mmol/L (ref 135–145)

## 2023-07-20 LAB — CBC W/ AUTO DIFF
BASOPHILS ABSOLUTE COUNT: 0 10*9/L (ref 0.0–0.1)
BASOPHILS RELATIVE PERCENT: 0.7 %
EOSINOPHILS ABSOLUTE COUNT: 0 10*9/L (ref 0.0–0.5)
EOSINOPHILS RELATIVE PERCENT: 0.5 %
HEMATOCRIT: 31.9 % — ABNORMAL LOW (ref 34.0–44.0)
HEMOGLOBIN: 10.8 g/dL — ABNORMAL LOW (ref 11.3–14.9)
LYMPHOCYTES ABSOLUTE COUNT: 0.5 10*9/L — ABNORMAL LOW (ref 1.1–3.6)
LYMPHOCYTES RELATIVE PERCENT: 8 %
MEAN CORPUSCULAR HEMOGLOBIN CONC: 33.8 g/dL (ref 32.0–36.0)
MEAN CORPUSCULAR HEMOGLOBIN: 41.5 pg — ABNORMAL HIGH (ref 25.9–32.4)
MEAN CORPUSCULAR VOLUME: 122.8 fL — ABNORMAL HIGH (ref 77.6–95.7)
MEAN PLATELET VOLUME: 7.8 fL (ref 6.8–10.7)
MONOCYTES ABSOLUTE COUNT: 0.6 10*9/L (ref 0.3–0.8)
MONOCYTES RELATIVE PERCENT: 9.8 %
NEUTROPHILS ABSOLUTE COUNT: 4.9 10*9/L (ref 1.8–7.8)
NEUTROPHILS RELATIVE PERCENT: 81 %
PLATELET COUNT: 1018 10*9/L (ref 150–450)
RED BLOOD CELL COUNT: 2.6 10*12/L — ABNORMAL LOW (ref 3.95–5.13)
RED CELL DISTRIBUTION WIDTH: 17.5 % — ABNORMAL HIGH (ref 12.2–15.2)
WBC ADJUSTED: 6.1 10*9/L (ref 3.6–11.2)

## 2023-07-20 LAB — TACROLIMUS LEVEL, TROUGH: TACROLIMUS, TROUGH: 4.4 ng/mL — ABNORMAL LOW (ref 5.0–15.0)

## 2023-07-20 LAB — MAGNESIUM: MAGNESIUM: 1.6 mg/dL (ref 1.6–2.6)

## 2023-07-20 LAB — PRO-BNP: PRO-BNP: 3704 pg/mL — ABNORMAL HIGH (ref <=300.0)

## 2023-07-20 LAB — PHOSPHORUS: PHOSPHORUS: 3 mg/dL (ref 2.4–5.1)

## 2023-07-20 NOTE — Unmapped (Unsigned)
Myeloproliferative Neoplasms & Bone Marrow Failure Clinic Follow Up Visit    Patient: Erin Welch  MRN: [id number]  DOB: 12/03/1950  Date of Visit: 07/20/2023       Reason for Visit   Erin Welch is a 72 y.o. with CALR-mutated ET diagnosed 05/2003, progressing toward post-ET myelofibrosis, s/p renal transplant (04/04/2013) maintained on tacrolimus, stage 1 lung cancer s/p XRT 06/19/2021, DM2, COPD and pulmonary hypertension who presents today in follow up.      Interval History   Since being seen last by Dr. Anise Welch 05/16/23, Erin Welch has increased her HU to 13 cap/weekly (12/20).     Last transfusion 05/18/23  Vonjo qtc intervals - Hinderliter of cards  Notes a few nosebleeds - they haven't been too bad, didn't take too long to stop.  Uses afrin.  Still with DOE - will sometimes increase O2 to 3L when up and moving around  Appetite is fair  Bowel movements are in a regular pattern  Did not move forward with Aranesp (insurance required [employer])    Review of Systems   10-systems otherwise reviewed and negative except as per Interval History.    Laboratory & Imaging Review       Assessment   #1 Essential thrombocytosis, CALR mutated  #2 Macrocytic anemia - transfusion-dependent again since 12/2022  #3 s/p renal transplant - etiology of renal failure is unclear  #4 CKD, eGFR 23ml/min      Erin Welch has done fairly well since her last visit with Dr. Anise Welch.  She reduced her HU to 10 cap/week, which did help with her anemia.  The day of her appointment, her hgb was 9.1 g/dL, and she received a blood transfusion as well. In late December, platelets increased to 1089k on the reduced dose of HU, so she increased to 13 cap/week per Dr. Anise Welch' instructions. Since then, she has not needed additional transfusions and her hgb is 10.8 g/dL However, her platelets have increased to 1018k.  Goal is <1000k, so increased her to 14 caps/week.    At last visit, there was consideration to begin a JAK inhibitor.  Dr. Anise Welch feels that pacritinib would be a reasonable option, especially given the difficulty in managing hgb on HU.  This was sent in after previous visit, however the copay was high and the MAP team was unable to get a response from the patient regarding finances.  As we have now turned to the new year, her copay is $2000 under medicare Part D.  Today, Erin Welch filled out a Nurse, mental health form and so we are able to re-open investigation into the medication and financial assistance.    Erin Welch had reviewed the drug and side effects prior to her appointment, so she is concerned about some of those.  Specifically, diarrhea.  Her bowel movements are already somewhat irregular (has a pattern of constipation then diarrhea, has seen GI) and she is worried about this.  We discussed scheduled imodium should she find this to be an issue while taking the medication.  Reviewed cardiac risks including bleeding and Qtc prolongation.  Will do EKG at her visit with Erin Welch next week and reach out to her cardiologist.      Plan   1. Patient to call [facility] to assess for copay/grants (sig waiver completed)  2. Increase hydroxyurea 500 mg BID (14 per week)  Goal: platelets <1,000/uL  3. Weekly labs + blood transfusion as-needed - can decrease frequency if hgb holding at  weekly visits.    Goal hemoglobin: >9 g/dL.    Indication for non-standard targets:  hypoxic lung disease requiring supplemental oxygen with increased symptoms at hgb <9g/dL  4. Continue aspirin 81mg  po daily   Indication = thromboprophylaxis in the setting of ET.  Low threshold to discontinue given CALR mutation if platelets >1,000 x 10^9/L or overt bleeding.     Erin Jarvis, RN, MSN, AGPCNP-C  Nurse Practitioner  Hematologic Malignancies  [facility]  07/20/2023    I personally spent 65 minutes face-to-face and non-face-to-face in the care of this patient, which includes all pre, intra, and post visit time on the date of service. This time was spent in reviewing notes, labs and other test results in the chart, speaking with and examining the patient, communicating with other medical teams and documentation of the clinical encounter. All documented time was specific to the E/M visit and does not include any procedures that may have been performed.      History of Present Illness     Erin Welch is a 73 y.o. woman with a history of chronic anemia, AVMs, thrombocytosis, ESRD s/p renal transplant (04/2013), hypertension, HFpEF, OSA, who presents for evaluation of chronic anemia in the setting of long-standing history of essential thrombocytosis.    She reports being initially diagnosed with thrombocytosis around 2004. At that time she was noted to have a platelet count > 2 million. This was incidentally noted on routine CBCs, and she was asymptomatic at the time. She had a bone marrow biopsy done (records not available to [place]). She was treated with anegrelide by a hematologist in [place], [place],  for many years which she did not tolerate it. She does not recall the side effects.      In 2011 she established with [facility] (Erin Welch). She was switched to hydroxyurea. She reports a history of CKD dating back at least to 2007. The etiology of her CKD was unclear. She underwent a renal transplant in 2014. Last year, her dose of hydroxyurea was increased and she developed worsening anemia. Evaluation of her anemia was remarkable for a borderline low vitamin B12 level, and an IgA kappa monoclonal gammopathy. She was treated with Aranesp monthly injection but reportedly the dose was too low (40 mcg/monthly) for many months and only recently increased to the appropriate level (200 mcg/monthly). She has not required any transfusion since October/November of last year. She completed four weekly doses of vitamin B12 injections.       Platelet history:        Hemoglobin history:        Hematology/Oncology History   Essential thrombocytosis (CMS-HCC)   05/13/2003 Initial Diagnosis    Diagnosis = Essential thrombocythemia, 05/2003.  Dr. Sherrlyn Hock.   During a well-woman evaluation, patient was noted to have incidental thrombocytosis, reportedly to 2.2 million.  She was started on hydroxyurea + anagrelide and in ~2007 she developed anemia with low haptoglobin with concern for hemolysis though no clear source was found.  She was ultimately found to have multiple small bowel AVMs and required IV iron replacement and blood transfusions to manage.  She had genetic HHT evaluation due to the AVMs at [facility] which was negative in 2007.      Bone marrow biopsy = not done    Driver mutation = Not known.  JAK2 [id number]  Additional Myeloid Mutations = Not known.   Karyotype = Not known.     Thrombosis Risk =  05/13/2003 -  Chemotherapy    HYDROXYUREA + ANAGRELIDE  - started due to platelets of 2.2 million.    - unclear when stopped anagrelide     07/12/2012 -  Chemotherapy    HYDROXYUREA  Dropped to single agent hydroxyurea  (+) transfusion-dependent anemia, received pRBCs on:  --> 05/06/20 (2)  --> 06/09/20 (2)  Started on darbepoietin alfa (Aranesp) on 05/06/20    08/01/2020: WHOLE BODY PET-CT  - Mildly hypermetabolic right middle lobe nodule in the lateral segment as described above. Cannot exclude a low metabolic rate malignancy.      -Kidney transplant is noted in the left pelvis without abnormality noted. No obstruction is detected.      -Mild to moderate cardiomegaly is noted with small to moderate size pericardial effusion. This was not noted on preceding exams.     08/26/20: 1000mg  daily alternating with 500mg  daily  WBC 4.3. ANC 3.6. Hgb 9.8. MCV 124. Plts 542.     09/29/2020: Seen by Porfirio Oar, MD - [facility] (first visit)    [facility] 10/08/2020:  Markedly hypercellular bone marrow (70%) with features compatible with reported history of Essential Thrombocytosis and 4% blasts by manual aspirate differential (see Comment)  -  Mild and focally moderate reticulin fibrosis (MF1-2).  - Driver Mutation = CALR, Type 1. VAF 36%   - Additional Myeloid Mutations = not detected.   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 anc 3.7. Hgb 10.0. MCV 124. Plts 681.     11/09/2021: Seen by B. Reeves in follow up.   She continues on hydroyxurea, now back to 1000mg  po daily. She completed radiation therapy for lung cancer. Although she had been transfusion-dependent of red blood cells not responsive to ESAs and there was consideration for danazol, since starting Jardiance (SGL2 inhibitor) for diabetes, her hemoglobin has increased and she has not required a blood transfusion since 05/11/2021.   WBC 4.7 with normal differential save for lymphopenia (ALC 0.3), hgb 10.7 g/dL with MCV 161 (from hydroxyurea), and platelets well-controlled at 552     04/13/2022: Seen by Shaune Pascal in follow up.   Continues on hydroxyurea 500mg  po BID (16 tablets/week)  Hgb 11.6 g/dL.  No transfusions.     09/02/2022: Hgb 12.0 g/dL  0/96/04: Hgb 54.0 g/dL  9/81/19: Hgb 8.9 g/dL  --> 1/47/82: 1U PRBC  02/23/23: Hgb 9.9 g/dL  --> 9/56/21: 1U PRBC  03/07/23: hgb 7.3 g/dL  --> 09/17/63: 2U PRBC  03/23/23: seen by Suezanne Cheshire in follow up.   --> continue HU 500mg  po BID (16 pills/wk)  03/28/23: Hgb 9.7 g/L.  Plts 1091.   --> increased HU to 17 pills/wk per Erin Welch  05/09/23: Hgb 9.2 g/dL.  Plts 708. WBC 3.2.      05/09/2023 Biopsy    BONE MARROW BIOPSY  Indication = worsening anemia  @[place]     -  Hypercellular bone marrow (60%) with features compatible with reported history of Essential Thrombocytosis and 3% blasts by manual aspirate differential (see Comment)  -  Mild reticulin fibrosis and no significant collagen fibrosis (MF-1)    Peripheral blood:   WBC 3.2, ANC 2.6, Hgb 9.2, MCV 1259, Plts 708.   No leukoerythroblastsosis    No LDH from 05/09/23, but from 03/08/23: 365 (ULN 246).                Medications     Current Outpatient Medications:     acetaminophen (TYLENOL) 325 MG  tablet, Take 2 tablets (650 mg total) by mouth every four (4) hours as needed., Disp: , Rfl:     albuterol HFA 90 mcg/actuation inhaler, Inhale 2 puffs every six (6) hours as needed for wheezing or shortness of breath., Disp: 1 Inhaler, Rfl: 0    allopurinol (ZYLOPRIM) 100 MG tablet, TAKE 2 TABLETS BY MOUTH IN THE MORNING, Disp: 180 tablet, Rfl: 0    amlodipine (NORVASC) 5 MG tablet, Take 1 tablet by mouth once daily, Disp: 90 tablet, Rfl: 0    aspirin (ECOTRIN) 81 MG tablet, Take 1 tablet (81 mg total) by mouth daily., Disp: , Rfl:     carvedilol (COREG) 25 MG tablet, Take 1 tablet by mouth twice daily, Disp: 180 tablet, Rfl: 0    cephalexin (KEFLEX) 250 MG capsule, Take 1 capsule by mouth once daily, Disp: 90 capsule, Rfl: 0    cholecalciferol, vitamin D3 25 mcg, 1,000 units,, 1,000 unit (25 mcg) tablet, Take 1 tablet (25 mcg total) by mouth daily., Disp: 90 tablet, Rfl: 3    colchicine (COLCRYS) 0.6 mg tablet, Take 1 tablet (0.6 mg total) by mouth two (2) times a day as needed. For acute gout, take 2 tablets by mouth, then 1 tablet one hour later on first day. May continue 1 tablet up to twice a day as needed., Disp: , Rfl:     empagliflozin (JARDIANCE) 10 mg tablet, Take 1 tablet by mouth once daily, Disp: 90 tablet, Rfl: 3    eplerenone (INSPRA) 25 MG tablet, Take 2 tablets (50 mg total) by mouth daily., Disp: , Rfl:     epoetin alfa (EPOGEN,PROCRIT) 40,000 unit/mL injection, Inject 1 mL (40,000 Units total) under the skin once a week., Disp: 4 mL, Rfl: 11    fluticasone propionate (FLONASE) 50 mcg/actuation nasal spray, Use 1 spray(s) in each nostril twice daily, Disp: 16 g, Rfl: 11    furosemide (LASIX) 40 MG tablet, Take 1 tablet (40 mg total) by mouth two (2) times a day., Disp: 180 tablet, Rfl: 3    glucosamine-chondroitin 500-400 mg tablet, Take 1 tablet by mouth Two (2) times a day., Disp: , Rfl:     hydroxyurea (HYDREA) 500 mg capsule, Take 2 capsules by mouth once daily, Disp: 180 capsule, Rfl: 1    losartan (COZAAR) 50 MG tablet, Take 1 tablet by mouth once daily, Disp: 90 tablet, Rfl: 0    MEDICAL SUPPLY ITEM, 14 cm into each nostril nightly. CPAP Auto titrating machine 14cmH2O 3PR  [employer] is patient's DME provider., Disp: , Rfl:     metFORMIN (GLUCOPHAGE) 500 MG tablet, Take 1 tablet (500 mg total) by mouth in the morning and 1 tablet (500 mg total) in the evening. Take with meals., Disp: 180 tablet, Rfl: 3    MYFORTIC 180 mg EC tablet, Take 2 tablets (360 mg total) by mouth two (2) times a day., Disp: 120 tablet, Rfl: 11    OMEGA-3/DHA/EPA/FISH OIL (FISH OIL-OMEGA-3 FATTY ACIDS) 300-1,000 mg capsule, Take 1 capsule (1 g total) by mouth., Disp: , Rfl:     pacritinib (VONJO) 100 mg capsule, Take 1 capsule (100 mg total) by mouth two (2) times a day. Swallow capsules whole. Do not open, break, or chew capsules., Disp: 120 capsule, Rfl: 0    pantoprazole (PROTONIX) 40 MG tablet, Take 1 tablet by mouth once daily, Disp: 90 tablet, Rfl: 0    pravastatin (PRAVACHOL) 40 MG tablet, Take 1 tablet (40 mg total) by mouth daily., Disp: 90 tablet,  Rfl: 3    PROGRAF 0.5 mg capsule, Take 1 capsule (0.5 mg total) by mouth two (2) times a day., Disp: 60 capsule, Rfl: 1    torsemide (DEMADEX) 20 MG tablet, Take 3 tablets (60 mg total) by mouth daily. Do not take furosemide if you are taking torsemide., Disp: 90 tablet, Rfl: 6    umeclidinium (INCRUSE ELLIPTA) 62.5 mcg/actuation inhaler, Inhale 1 puff by mouth once daily, Disp: 90 each, Rfl: 3    zolpidem (AMBIEN) 5 MG tablet, Take 1 tablet (5 mg total) by mouth nightly as needed., Disp: 30 tablet, Rfl: 3    sodium bicarbonate 650 mg tablet, Take 2 tablets (1,300 mg total) by mouth two (2) times a day. 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    Diabetes 2 09/02/2022    Diastolic CHF (CMS-HCC)     RHC 10/2012: PCWP 25 MPAP 40 PVR 2.4    Dyspnea on exertion 02/07/2015    Essential thrombocytosis (CMS-HCC) 2005    Heart disease 07/12/2014    History of renal transplant 03/2013    complicated by delayed onset graft function    Hypertension     Inverted nipple 01/19/2021    bilateral and pt states always have been inverted    Joint pain     Obstructive sleep apnea     on home CPAP therapy    Osteoarthritis     Pulmonary edema 01/07/2013    Pulmonary hypertension (CMS-HCC)     Squamous cell skin cancer      Past Surgical History:   Procedure Laterality Date    HAND SURGERY      PR CYSTOSCOPY,REMV CALCULUS,SIMPLE  05/15/2013    Procedure: CYSTOURETHROSCOPY, WITH REMOVAL OF FOREIGN BODY, CALCULUS OR URETERAL STENT FROM URETHRA OR BLADDER; SIMPLE;  Surgeon: Randalyn Rhea, MD;  Location: MAIN OR [place];  Service: Urology    PR REMOVAL TUNNELED INTRAPERITONEAL CATHETER N/A 05/15/2013    Procedure: REMOVAL OF PERMANENT INTRAPERITONEAL CANNULA OR CATHETER;  Surgeon: Purcell Mouton, MD;  Location: MAIN OR [facility];  Service: Transplant    PR RIGHT HEART CATH O2 SATURATION & CARDIAC OUTPUT N/A 03/24/2015    Procedure: Right Heart Catheterization;  Surgeon: Mittie Bodo, MD;  Location: [facility] CATH;  Service: Cardiology    PR TRANSPLANT,PREP CADAVER RENAL GRAFT N/A 04/04/2013    Procedure: [facility] STD PREP CAD DONR RENAL ALLOGFT PRIOR TO TRNSPLNT, INCL DISSEC/REM PERINEPH FAT, DIAPH/RTPER ATTAC;  Surgeon: Vivi Barrack, MD;  Location: MAIN OR [place];  Service: Transplant    PR TRANSPLANTATION OF KIDNEY N/A 04/04/2013    Procedure: RENAL ALLOTRANSPLANTATION, IMPLANTATION OF GRAFT; WITHOUT RECIPIENT NEPHRECTOMY;  Surgeon: Vivi Barrack, MD;  Location: MAIN OR [facility];  Service: Transplant    SKIN  BIOPSY      knee and back        Social History     Social History     Socioeconomic History    Marital status: Married    Number of children: 3   Occupational History    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 Yes    Tanning bed use? No    Are you easily burned? Yes    Excessive sun exposure? No    Blistering sunburns? No   Social History Narrative    Lives in one-level home in [place] with husband Marilu Favre) x 53 years.     Has 3 children (Buddy, Billy, Brad).     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     Current hobbies include: domestic travel, spending time with grandkids         ADLs:    Feeding: Independent    Dressing: Independent    Ambulation: Independent    Toileting: Independent    Bathing: Independent        IADLs:    Using the telephone: Independent    Shopping: Independent    Meal preparation: Independent    Medication management: Independent    Managing finances: Independent    Housework: Dependent    Transportation (driving or navigating public transit): Independent         Assistive devices: electric W/C for longer distances;  Has quad cane          ----        Lives at home with husband. Independent, drives. Has 3 adult children. Former smoker. Rare alcohol use. No recreational drugs.      Social Drivers of Psychologist, prison and probation services Strain: Low Risk  (03/03/2023)    Overall Financial Resource Strain (CARDIA)     Difficulty of Paying Living Expenses: Not hard at all   Food Insecurity: No Food Insecurity (03/03/2023)    Hunger Vital Sign     Worried About Running Out of Food in the Last Year: Never true     Ran Out of Food in the Last Year: Never true   Transportation Needs: No Transportation Needs (03/03/2023)    PRAPARE - Therapist, art (Medical): No     Lack of Transportation (Non-Medical): No       Family History   Father had MI  4 siblings  No family history of blood cancers or disorders.    Physical Examination     Vitals:    07/20/23 1229   BP: 152/61   Pulse: 68   Resp: 18   Temp: 36.7 ??  C (98.1 ??F)   TempSrc: Temporal   SpO2: 95%   Weight: 89.1 kg (196 lb 6.9 oz)           GENERAL:  Well-appearing and in no apparent distress.  No apparent dysmorphia.  Accompanied by her husband.  Nasal cannula in place.   HEENT:  Anicteric sclera.    CV:  RRR.  +II/VI murmur heard best at RUSB.  RESP:  Lung bases are clear. Speaking comfortably in full sentences.   EXTREMITIES:  No clubbing, cyanosis, nor edema.  NEURO:  AO X 4.   SKIN: No petechiae nor purpura.  No rashes.   PSYCH: Affect is full and congruent.  No psychomotor agitation nor retardation.  Thought process is goal-directed and linear.      Laboratory Testing and Imaging     Results for orders placed or performed in visit on 07/20/23   Magnesium Level   Result Value Ref Range    Magnesium 1.6 1.6 - 2.6 mg/dL   Comprehensive Metabolic Panel   Result Value Ref Range    Sodium 140 135 - 145 mmol/L    Potassium 4.0 3.4 - 4.8 mmol/L    Chloride 103 98 - 107 mmol/L    CO2 26.0 20.0 - 31.0 mmol/L    Anion Gap 11 5 - 14 mmol/L    BUN 50 (H) 9 - 23 mg/dL    Creatinine 1.61 (H) 0.55 - 1.02 mg/dL    BUN/Creatinine Ratio 32     eGFR CKD-EPI (2021) Female 34 (L) >=60 mL/min/1.42m2    Glucose 203 (H) 70 - 179 mg/dL    Calcium 9.5 8.7 - 09.6 mg/dL    Albumin 3.6 3.4 - 5.0 g/dL    Total Protein 6.5 5.7 - 8.2 g/dL    Total Bilirubin 0.5 0.3 - 1.2 mg/dL    AST 16 <=04 U/L    ALT <7 (L) 10 - 49 U/L    Alkaline Phosphatase 50 46 - 116 U/L   Pro-BNP   Result Value Ref Range    PRO-BNP 3,704.0 (H) <=300.0 pg/mL   Phosphorus Level   Result Value Ref Range    Phosphorus 3.0 2.4 - 5.1 mg/dL   CBC w/ Differential   Result Value Ref Range    WBC 6.1 3.6 - 11.2 10*9/L    RBC 2.60 (L) 3.95 - 5.13 10*12/L    HGB 10.8 (L) 11.3 - 14.9 g/dL    HCT 54.0 (L) 98.1 - 44.0 %    MCV 122.8 (H) 77.6 - 95.7 fL    MCH 41.5 (H) 25.9 - 32.4 pg    MCHC 33.8 32.0 - 36.0 g/dL    RDW 19.1 (H) 47.8 - 15.2 %    MPV 7.8 6.8 - 10.7 fL    Platelet 1,018 (HH) 150 - 450 10*9/L    Neutrophils % 81.0 %    Lymphocytes % 8.0 %    Monocytes % 9.8 %    Eosinophils % 0.5 %    Basophils % 0.7 %    Absolute Neutrophils 4.9 1.8 - 7.8 10*9/L    Absolute Lymphocytes 0.5 (L) 1.1 - 3.6 10*9/L    Absolute Monocytes 0.6 0.3 - 0.8 10*9/L    Absolute Eosinophils 0.0 0.0 - 0.5 10*9/L    Absolute Basophils 0.0 0.0 - 0.1 10*9/L    Macrocytosis Marked (A) Not Present    Anisocytosis Slight (A) Not Present   Morphology Review   Result Value Ref Range  Smear Review Comments See Comment (A) Undefined    Polychromasia Slight (A) Not Present    Basophilic Stippling Present (A) Not Present    Hypersegmented Neutrophils Present (A) Not Present     *Note: Due to a large number of results and/or encounters for the requested time period, some results have not been displayed. A complete set of results can be found in Results Review. mmol/L    CO2 26.0 20.0 - 31.0 mmol/L    Anion Gap 11 5 - 14 mmol/L    BUN 50 (H) 9 - 23 mg/dL    Creatinine 1.61 (H) 0.55 - 1.02 mg/dL    BUN/Creatinine Ratio 32     eGFR CKD-EPI (2021) Female 34 (L) >=60 mL/min/1.24m2    Glucose 203 (H) 70 - 179 mg/dL    Calcium 9.5 8.7 - 09.6 mg/dL    Albumin 3.6 3.4 - 5.0 g/dL    Total Protein 6.5 5.7 - 8.2 g/dL    Total Bilirubin 0.5 0.3 - 1.2 mg/dL    AST 16 <=04 U/L    ALT <7 (L) 10 - 49 U/L    Alkaline Phosphatase 50 46 - 116 U/L   Pro-BNP   Result Value Ref Range    PRO-BNP 3,704.0 (H) <=300.0 pg/mL   Phosphorus Level   Result Value Ref Range    Phosphorus 3.0 2.4 - 5.1 mg/dL   CBC w/ Differential   Result Value Ref Range    WBC 6.1 3.6 - 11.2 10*9/L    RBC 2.60 (L) 3.95 - 5.13 10*12/L    HGB 10.8 (L) 11.3 - 14.9 g/dL    HCT 54.0 (L) 98.1 - 44.0 %    MCV 122.8 (H) 77.6 - 95.7 fL    MCH 41.5 (H) 25.9 - 32.4 pg    MCHC 33.8 32.0 - 36.0 g/dL    RDW 19.1 (H) 47.8 - 15.2 %    MPV 7.8 6.8 - 10.7 fL    Platelet 1,018 (HH) 150 - 450 10*9/L    Neutrophils % 81.0 %    Lymphocytes % 8.0 %    Monocytes % 9.8 %    Eosinophils % 0.5 %    Basophils % 0.7 %    Absolute Neutrophils 4.9 1.8 - 7.8 10*9/L    Absolute Lymphocytes 0.5 (L) 1.1 - 3.6 10*9/L    Absolute Monocytes 0.6 0.3 - 0.8 10*9/L    Absolute Eosinophils 0.0 0.0 - 0.5 10*9/L    Absolute Basophils 0.0 0.0 - 0.1 10*9/L    Macrocytosis Marked (A) Not Present    Anisocytosis Slight (A) Not Present   Morphology Review   Result Value Ref Range    Smear Review Comments See Comment (A) Undefined    Polychromasia Slight (A) Not Present    Basophilic Stippling Present (A) Not Present    Hypersegmented Neutrophils Present (A) Not Present     *Note: Due to a large number of results and/or encounters for the requested time period, some results have not been displayed. A complete set of results can be found in Results Review.

## 2023-07-23 DIAGNOSIS — Z940 Kidney transplant status: Principal | ICD-10-CM

## 2023-07-26 DIAGNOSIS — N183 Anemia of chronic renal failure, stage 3 (moderate), unspecified whether stage 3a or 3b CKD (CMS-HCC): Principal | ICD-10-CM

## 2023-07-26 DIAGNOSIS — I5030 Unspecified diastolic (congestive) heart failure: Principal | ICD-10-CM

## 2023-07-26 DIAGNOSIS — D631 Anemia in chronic kidney disease: Principal | ICD-10-CM

## 2023-07-26 NOTE — Unmapped (Signed)
Clinical Child psychotherapist at the [facility] (outpatient):    []  InP   [] Mail   [x] Em    [] MyC   [] Ph    [] DNC         SW received notification from [facility] online portal ([email or web address]) that the patient and spouse's CPAP renewal/supplies orders have been accepted.  Both have been sent to [facility] resupply ([employer]a [facility], [address], [address], [place], [place] [address], ph [phone], fax [phone], [email or web address]). This company should be in touch with the patient as needed re: CPAP supplies.     --------------------------------------------------------------  Patient Information:  Erin Welch, Erin Welch  MRN: [id number]  DOB: [birth year]  Address: [address]  [place] [address]  [address]  Primary phone: [phone]  Email: [facility]  PCP: Mack Guise, MD    Primary Insurance:       MEDICARE - MEDICARE PART A AND PART B  Secondary Insurance:       BCBS - BCBS MCARE SUPPLEMENT ( ONLY)    --------------------------------------------------------------

## 2023-08-01 NOTE — Unmapped (Signed)
Hematology/Oncology Return Visit:    PCP: Tawnya Crook, MD     Reason for visit: Follow-up for essential thrombocytosis as well as symptomatic anemia    Assessment and Recommendations:   Erin Welch is a 73 y.o. 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 Previously transfusion dependent, but she has not required transfusions since starting Jardiance on October 2022. This improvement of hemoglobin with Jardiance has allowed for up titration of Hydrea for better control of her thrombocytosis. Platelet count goal is <650k. Her platelets today are 872k. We suggest titrating up hydroxyurea to 18 pills a week.     She saw Dr. Anise Salvo and her team yesterday.  Wanted to a me my opinion on switching to pacritinib.  I reiterated that I had no eexperience with the newer MPN medications, and that I relied on the experience of Dr. Anise Salvo and her team.      Stage I lung cancer- She was diagnosed with Stage I lung cancer and completed radiation 06/19/21.     Pulmonary hypertension- on COPD treatment,     S/p kidney transplant- continues on tacrolimus without issues. Has regular follow up with her kidney doctors. Of note, interpheron alpha should not be added to this patient's regimen because of her kidney transplant status.     Plan:  Per Dr. Anise Salvo' team  Will obtain ECG today     Follow up with Dr. Anise Salvo      ------------------------------------------------------------------------  Interval history:      History of present illness:     Erin Welch is a 73 y.o. female with a history of end-stage renal failure and essential thrombocytosis here for follow-up. In 2011, she was titrating her own hydroxyurea and anagrelide doses and her baseline platelet counts were running fairly high. For that reason when she presented for transplantation that year and her platelet count was over 800,000, she was dismissed.  At that time her kidney disease was stage IV.  She was subsequently admitted to [facility] from October 06, 2012 to October 26, 2012 and  was noted to have pulmonary hypertension and volume overload.  She was initiated on hemodialysis on and she remained on outpatient hemodialysis on a schedule of Tuesday Thursday Saturday.  She states she has lost 30 pounds since that time.  In the hospital her Hydrea dose was reduced to 500 mg daily and her anagrelide dose was changed from 6 mg daily to 5 mg daily.  Her last platelet count was 402,000 which was done at her dialysis center on the 22nd.  Her hemoglobin was 11.8.  She underwent renal transplantation on 04/04/2013 and has been followed by nephrology. She continues to take her own medications.  Thrombocytosis and anemia have been problems, and she has requred transfusion on two occasions. Anemia workup in October 2021 showed a small IgA kappa M spike and low B12.  She is now on B12 supplementation    The patient was admitted to [facility] on 12/15/2020 for respiratory symptoms and tested positive for parainfluenza virus. She states that prior to the admission, she was dehydrated and had a low creatinine, which resolved and increased upon discharge. During this admission, her Tacrolimus dose was adjusted to 1 mg daily, and she was given 2 units of PRBCs and 6000 units of Retacrit.  On October or 2022, she was started on Jardiance for control of her diabetes. Since then, her transfusion needs have stopped and she has been able to increase her doses of hydrea to better control her platelet count. Her symptoms from anemia (fatigue, tiredness) have significantly improved.     All PMH, PSH, SocialHx, FHx, and allergies were reviewed with the patient and are unchanged.  ROS is otherwise negative x12 systems.    Physical Exam  Vitals:    01/18/23 1415   BP: 135/61 Pulse: 68   Temp: 35.9 ??C (96.7 ??F)   SpO2: 97%       GEN: Pleasant, well-appearing woman in NAD  HEENT: PERRL, sclerae anicteric, conjunctiva clear, moist mucous membranes, no oral lesions or exudates  NECK: Supple, no JVD  CV: Normal rate, regular rhythm, normal S1 and S2, 2/6 SEM best appreciated over L sternal border.   PULM: Lungs clear to auscultation bilaterally, no wheezes, ronchi or rales, normal work of breathing  ABD: Soft, non-tender, non-distended, normoactive bowel sounds, no masses or organomegaly but limited by body habitus  EXT: Warm, well-perfused, no edema  SKIN: No rashes or ecchymoses  NEURO: Alert and oriented, speech intact, following commands, difficulty transitioning to exam table 2/2 L knee pain.    Labs:  _________________

## 2023-08-02 DIAGNOSIS — D473 Essential (hemorrhagic) thrombocythemia: Principal | ICD-10-CM

## 2023-08-03 DIAGNOSIS — D473 Essential (hemorrhagic) thrombocythemia: Principal | ICD-10-CM

## 2023-08-03 MED ORDER — PACRITINIB 100 MG CAPSULE
ORAL_CAPSULE | Freq: Two times a day (BID) | ORAL | 0 refills | 30.00 days | Status: CP
Start: 2023-08-03 — End: ?
  Filled 2023-08-12: qty 120, 30d supply, fill #0

## 2023-08-03 NOTE — Unmapped (Signed)
[facility] Specialty Medication Onboarding    Specialty Medication: VONJO 100 mg capsule (pacritinib)  Prior Authorization: Not Required   Financial Assistance: Yes - grant approved as secondary   Final Copay/Day Supply: $0 / 30 days     Insurance Restrictions: Yes - max 1 month supply     Notes to Pharmacist: no  Credit Card on File: not applicable  Start Date on Rx:  n/a     The triage team has completed the benefits investigation and has determined that the patient is able to fill this medication at [facility]. Please contact the patient to complete the onboarding or follow up with the prescribing physician as needed.

## 2023-08-04 NOTE — Unmapped (Signed)
[facility]    Patient Onboarding/Medication Counseling    Erin Welch is a 73 y.o. female with Essential thrombocythemia who I am counseling today on initiation of therapy.  I am speaking to the patient.    Was a Nurse, learning disability used for this call? No    Verified patient's date of birth / HIPAA.    Specialty medication(s) to be sent: Hematology/Oncology: Vonjo      Non-specialty medications/supplies to be sent: n/a      Medications not needed at this time: n/a         Vonjo (pacritinib)    Medication & Administration   Dosage:     1-100mg  capsules two times a day    Administration:   Take with or without food  Swallow whole. Do not chew, open or crush  Take at the same time each day    Adherence/Missed dose instructions:  Skip the missed dose and go back to your normal schedule  Do not take 2 doses at the same time or extra doses    Goals of Therapy   Treatment of intermediate or high-risk primary or secondary (postpolycythemia vera or postessential thrombocythemia) myelofibrosis in adults with a platelet count <50,000/mm3    Side Effects & Monitoring Parameters   List common side effects:  Peripheral edema (20%)  Diarrhea (48%); nausea (32%), vomiting (19%)  Dizziness (15%)  Nose bleeds (12%)  Fever (15%), shortness of breath (10%), cough (8%), Upper respiratory tract infection (10%)  Signs of a common cold - cough (8%)  Itching (10%)    The following side effects should be reported to the provider:  Signs of an allergic reaction  Signs of unusual bleeding or bruising (blood in urine, stool, coughing up blood, etc???)  Signs of an infection - Fever (15%), shortness of breath (10%), cough (8%), Upper respiratory tract infection (10%)  Weakness on 1 side of the body, trouble speaking or thinking, change in balance, drooping on one side of the face or blurred eyesight  Very back headache  Extreme weakness or fatigue (anemia)  Swelling in arms or legs  Abnormal heartbeat (fast, feeling of passing out, any heartbeat that does not feel normal)    Monitoring Parameters:  CBC with diff and coagulation parameters (pt, ptt, thrombin time, and INR) as clinically indicated.   Baseline ECG prior to pacritinib initiation, and as clinically necessary.   Contraindications, Warnings, & Precautions     GI toxicity: Almost one-half of patients receiving pacritinib in a clinical trial experienced diarrhea; the median time to resolution was 2 weeks. The incidence of diarrhea decreased over time, with over 40% of patients reporting diarrhea in the first 8 weeks of pacritinib, 15% in weeks 8 through 16, and <10% in weeks 16 through 24.  Hematologic toxicity: Pacritinib administration may result in worsening thrombocytopenia; dose reduction due to decreasing platelet counts occurred in some patients with preexisting moderate and/or severe thrombocytopenia.  Hemorrhage: Serious and fatal hemorrhages have occurred in patients with moderate to severe thrombocytopenia (platelet counts 50,000 to 100,000/mm3), as well as with severe thrombocytopenia (platelet counts <50,000/mm3). Grade 3 and higher bleeding events (eg, requiring transfusion or invasive intervention) have been reported.  Infection: Serious bacterial, mycobacterial, fungal, and viral infections may occur in patients treated with JAK inhibitors, including pacritinib.  Pacritinib has the potential to impair female fertility   In animal reproduction studies, administration of pacritinib to pregnant mice or rabbits at doses lower than the recommended human dose produced maternal  toxicity, embryo, and fetal loss. Use effective contraception throughout therapy and until medication is eliminated from body after treatment  It is not known if pacritinib is present in breast milk. Due to the potential for serious adverse reactions in the breastfed infant, the manufacturer recommends that breastfeeding be discontinued during therapy and for 2 weeks after the last pacritinib dose.  Drug/Food Interactions     Medication list reviewed in Epic. The patient was instructed to inform the care team before taking any new medications or supplements.  May decrease serum concentration of Amlodipine - monitor BP .   Do not use with moderate or strong CYP3A4 inhibitors or inducers  Ask your doctor before getting any vaccinations (live or inactive)  Vaccines (Inactivated/Non-Replicating): Immunosuppressants (Therapeutic Immunosuppressant Agents) may diminish the therapeutic effect of Vaccines (Inactivated/Non-Replicating). Management: Give inactivated vaccines at least 2 weeks prior to initiation of immunosuppressants when possible. Patients vaccinated less than 14 days before initiating or during therapy should be revaccinated at least 2 to 3 months after therapy is complete. (Risk D)   Vaccines (Live): May enhance the adverse/toxic effect of Immunosuppressants (Therapeutic Immunosuppressant Agents). Specifically, the risk of vaccine-associated infection may be increased. Vaccines (Live) may diminish the therapeutic effect of Immunosuppressants (Therapeutic Immunosuppressant Agents). Risk X  Storage, Handling Precautions, & Disposal   Store at room temperature  Store in the original packaging and keep the bottle tightly closed and protect from light.  Keep out of the reach of children and pets  Throw away unused or expired drugs.  Or check with your local police/fire department for a local drug take-back program. Do not flush down a toilet or pour down a drain.      Current Medications (including OTC/herbals), Comorbidities and Allergies     Current Outpatient Medications   Medication Sig Dispense Refill    acetaminophen (TYLENOL) 325 MG tablet Take 2 tablets (650 mg total) by mouth every four (4) hours as needed.      albuterol HFA 90 mcg/actuation inhaler Inhale 2 puffs every six (6) hours as needed for wheezing or shortness of breath. 1 Inhaler 0    allopurinol (ZYLOPRIM) 100 MG tablet TAKE 2 TABLETS BY MOUTH IN THE MORNING 180 tablet 0    amlodipine (NORVASC) 5 MG tablet Take 1 tablet by mouth once daily 90 tablet 0    aspirin (ECOTRIN) 81 MG tablet Take 1 tablet (81 mg total) by mouth daily.      carvedilol (COREG) 25 MG tablet Take 1 tablet by mouth twice daily 180 tablet 0    cephalexin (KEFLEX) 250 MG capsule Take 1 capsule by mouth once daily 90 capsule 0    cholecalciferol, vitamin D3 25 mcg, 1,000 units,, 1,000 unit (25 mcg) tablet Take 1 tablet (25 mcg total) by mouth daily. 90 tablet 3    colchicine (COLCRYS) 0.6 mg tablet Take 1 tablet (0.6 mg total) by mouth two (2) times a day as needed. For acute gout, take 2 tablets by mouth, then 1 tablet one hour later on first day. May continue 1 tablet up to twice a day as needed.      empagliflozin (JARDIANCE) 10 mg tablet Take 1 tablet by mouth once daily 90 tablet 3    eplerenone (INSPRA) 25 MG tablet Take 2 tablets (50 mg total) by mouth daily.      epoetin alfa (EPOGEN,PROCRIT) 40,000 unit/mL injection Inject 1 mL (40,000 Units total) under the skin once a week. 4 mL 11    fluticasone propionate (  FLONASE) 50 mcg/actuation nasal spray Use 1 spray(s) in each nostril twice daily 16 g 11    furosemide (LASIX) 40 MG tablet Take 1 tablet (40 mg total) by mouth two (2) times a day. 180 tablet 3    glucosamine-chondroitin 500-400 mg tablet Take 1 tablet by mouth Two (2) times a day.      hydroxyurea (HYDREA) 500 mg capsule Take 2 capsules by mouth once daily 180 capsule 1    losartan (COZAAR) 50 MG tablet Take 1 tablet by mouth once daily 90 tablet 0    MEDICAL SUPPLY ITEM 14 cm into each nostril nightly. CPAP Auto titrating machine 14cmH2O 3PR  [employer] is patient's DME provider.      metFORMIN (GLUCOPHAGE) 500 MG tablet Take 1 tablet (500 mg total) by mouth in the morning and 1 tablet (500 mg total) in the evening. Take with meals. 180 tablet 3    MYFORTIC 180 mg EC tablet Take 2 tablets (360 mg total) by mouth two (2) times a day. 120 tablet 11 OMEGA-3/DHA/EPA/FISH OIL (FISH OIL-OMEGA-3 FATTY ACIDS) 300-1,000 mg capsule Take 1 capsule (1 g total) by mouth.      pacritinib (VONJO) 100 mg capsule Take 1 capsule (100 mg total) by mouth two (2) times a day. Swallow capsules whole. Do not open, break, or chew capsules. 60 capsule 0    pantoprazole (PROTONIX) 40 MG tablet Take 1 tablet by mouth once daily 90 tablet 0    pravastatin (PRAVACHOL) 40 MG tablet Take 1 tablet (40 mg total) by mouth daily. 90 tablet 3    PROGRAF 0.5 mg capsule Take 1 capsule (0.5 mg total) by mouth two (2) times a day. 60 capsule 1    sodium bicarbonate 650 mg tablet Take 2 tablets (1,300 mg total) by mouth two (2) times a day. NEW DOSE 360 tablet 3    torsemide (DEMADEX) 20 MG tablet Take 3 tablets (60 mg total) by mouth daily. Do not take furosemide if you are taking torsemide. 90 tablet 6    umeclidinium (INCRUSE ELLIPTA) 62.5 mcg/actuation inhaler Inhale 1 puff by mouth once daily 90 each 3    zolpidem (AMBIEN) 5 MG tablet Take 1 tablet (5 mg total) by mouth nightly as needed. 30 tablet 3     No current facility-administered medications for this visit.       Allergies   Allergen Reactions    Macrobid [Nitrofurantoin Monohyd/M-Cryst] Other (See Comments)     Duplicate    Nitrofurantoin Nausea And Vomiting       Patient Active Problem List   Diagnosis    Diarrhea    Heart failure with preserved left ventricular function (HFpEF) (CMS-HCC)    Essential thrombocytosis (CMS-HCC)    Anemia of chronic kidney failure    AKI (acute kidney injury) (CMS-HCC)    Aftercare following organ transplant    Hypertension    Post-traumatic osteoarthritis of knee    Pulmonary hypertension (CMS-HCC)    Renal transplant, status post    GERD (gastroesophageal reflux disease)    OSA on CPAP    Gout    Recurrent UTI    Anemia of chronic renal failure, stage 3 (moderate) (CMS-HCC)    AVM (arteriovenous malformation) of small bowel, acquired    Immunosuppressed status (CMS-HCC)    History of nonmelanoma skin cancer    Lung nodule    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    Appropriateness of Therapy     Acute infections noted within Epic:  No active infections  Patient reported infection: None    Is the medication and dose appropriate based on diagnosis, medication list, comorbidities, allergies, medical history, patient???s ability to self-administer the medication, and therapeutic goals? Yes    Prescription has been clinically reviewed: Yes      Baseline Quality of Life Assessment      How many days over the past month did your condition  keep you from your normal activities? For example, brushing your teeth or getting up in the morning. 0    Financial Information     Medication Assistance provided: Prior Authorization and [employer]    Anticipated copay of $0 reviewed with patient. Verified delivery address.    Delivery Information     Scheduled delivery date: 08/11/23    Expected start date: TBD      Medication will be delivered via Next Day Courier to the prescription address in [facility].  This shipment will not require a signature.      Explained the services we provide at [facility] and that each month we would call to set up refills.  Stressed importance of returning phone calls so that we could ensure they receive their medications in time each month.  Informed patient that we should be setting up refills 7-10 days prior to when they will run out of medication.  A pharmacist will reach out to perform a clinical assessment periodically.  Informed patient that a welcome packet, containing information about our pharmacy and other support services, a Notice of Privacy Practices, and a drug information handout will be sent.      The patient or caregiver noted above participated in the development of this care plan and knows that they can request review of or adjustments to the care plan at any time.      Patient or caregiver verbalized understanding of the above information as well as how to contact the pharmacy at [phone] option 4 with any questions/concerns.  The pharmacy is open Monday through Friday 8:30am-4:30pm.  A pharmacist is available 24/7 via pager to answer any clinical questions they may have.    Patient Specific Needs     Does the patient have any physical, cognitive, or cultural barriers? No    Does the patient have adequate living arrangements? (i.e. the ability to store and take their medication appropriately) Yes    Did you identify any home environmental safety or security hazards? No    Patient prefers to have medications discussed with  Patient     Is the patient or caregiver able to read and understand education materials at a high school level or above? Yes    Patient's primary language is  English     Is the patient high risk? No    SOCIAL DETERMINANTS OF HEALTH     At the [facility], we have learned that life circumstances - like trouble affording food, housing, utilities, or transportation can affect the health of many of our patients.   That is why we wanted to ask: are you currently experiencing any life circumstances that are negatively impacting your health and/or quality of  life? Patient declined to answer    Social Drivers of Health     Food Insecurity: No Food Insecurity (03/03/2023)    Hunger Vital Sign     Worried About Running Out of Food in the Last Year: Never true     Ran Out of Food in the Last Year: Never true   Internet Connectivity: Not on file   Housing/Utilities: Low Risk  (03/03/2023)    Housing/Utilities     Within the past 12 months, have you ever stayed: outside, in a car, in a tent, in an overnight shelter, or temporarily in someone else's home (i.e. couch-surfing)?: No     Are you worried about losing your housing?: No     Within the past 12 months, have you been unable to get utilities (heat, electricity) when it was really needed?: No   Tobacco Use: Medium Risk (07/26/2023)    Patient History     Smoking Tobacco Use: Former     Smokeless Tobacco Use: Never     Passive Exposure: Not on file   Transportation Needs: No Transportation Needs (03/03/2023)    PRAPARE - Transportation     Lack of Transportation (Medical): No     Lack of Transportation (Non-Medical): No   Alcohol Use: Not on file   Interpersonal Safety: Not on file   Physical Activity: Not on file   Intimate Partner Violence: Not on file   Stress: Not on file   Substance Use: Not on file (05/16/2023)   Social Connections: Not on file   Financial Resource Strain: Low Risk  (03/03/2023)    Overall Financial Resource Strain (CARDIA)     Difficulty of Paying Living Expenses: Not hard at all   Depression: Not at risk (04/14/2023)    PHQ-2     PHQ-2 Score: 0   Health Literacy: Not on file       Would you be willing to receive help with any of the needs that you have identified today? No       Erin Welch, PharmD  [facility] Specialty Pharmacist

## 2023-08-12 DIAGNOSIS — I151 Hypertension secondary to other renal disorders: Principal | ICD-10-CM

## 2023-08-12 DIAGNOSIS — I272 Pulmonary hypertension, unspecified: Principal | ICD-10-CM

## 2023-08-12 DIAGNOSIS — I5030 Unspecified diastolic (congestive) heart failure: Principal | ICD-10-CM

## 2023-08-12 NOTE — Unmapped (Signed)
Cardiology Clinic Followup Note    Referring Provider: Cleda Mccreedy, MD   Primary Provider: Mack Guise, MD     Reason for Visit:  Evaluation of hypertension and heart failure with preserved ejection fraction    Assessment & Plan:  1. Heart failure with preserved left ventricular function (HFpEF) (CMS-HCC)  Unfortunately Erin Welch has experienced a decline in her health since our last clinic appointment. She previously required oxygen only on exertion. Then in August 2024 she was admitted for diarrhea and AKI treated with volume resuscitation and electrolyte repletion. Since then she has had progressively increased oxygen requirement, including now 3L at baseline. She uses a CPAP with oxygen at night. Her predominent symptom is worsening dyspnea, but she also notes cough productive of clear phlegm, fatgiue, and poor sleep. She denies pedal edema, only needing to take an extra dose of lasix for refractory swelling about once a month. Otherwise she feels fluid status is well managed on lasix 80mg  daily. Volume exam today is difficult, but she does not appear grossly volume overloaded and has clear lung sounds. Trace nonpitting pedal edema is noted. Furthermore she does not feel she is carrying extra fluid.     She follows with Dr. Ala Dach in pulmonary hypertension clinic and for COPD managed with inhalers, Dr. Marcheta Grammes in hematology for her anemia and thrombocytosis, and Dr. Sarita Bottom for her renal transplant which has been functioning well. Thus she has several noncardiac factors which could be contributing to dyspnea and the exact etiology is unclear.    With regards to cardiac causes, she has some degree of heart failure with preserved ejection fraction and suggestion of pulmonary hypertension by prior echocardiography. Her heart failure is treated with with empagliflozin, eplerenone, carvedilol, and losartan. She takes furosemide 80mg  daily. Her echo in July 2024 noted normal EF, grade 2 diastolic dysfunction, and mild-moderate pulmonary hypertension. An echo was repeated in August which showed hyperdynamic LV function, but this was in the setting of volume depletion from diarrhea. Notably her Pro-BNP was elevated above baseline on most recent check.     Repeating an echocardiogram will be helpful in elucidating if worsening heart failure or if pulmonary hypertension could account for her dyspnea. A RHC should be considered, and per Dr. Marily Lente notes this was attempted several years ago via RIJ approach but aborted due to obstruction/?stenosis. I will discuss pursuing RHC with Dr. Ala Dach. No medication changes today. She has close follow up with several providers and I will plan to see her in a few months with an echo performed in the interim.     2. Hypertension secondary to other renal disorders  [name]'s blood pressure is close to goal on today's clinic determinations, and on her report of home measurements which tend to run low 130s/60s.  She will continue treatment with eplerenone, carvedilol, losartan, amlodipine and furosemide.     History of Present Illness:  Erin Welch is a 73 year old female with a history of kidney transplant who returns to our clinic for reevaluation of her heart failure with preserved ejection fraction and hypertension. Unfortunately she has experienced decline in her health since our last clinic appointment. She previously required oxygen only on exertion. Then in August 2024 she was admitted for diarrhea and AKI treated with volume resuscitation and electrolyte repletion. Since then she has had progressively increased oxygen requirement, including now 3L at baseline. Her predominent symptom is worsening dyspnea, but she also notes cough productive of clear phlegm, fatgiue, and poor sleep.  She does not feel volume overloaded and is managed on 80mg  lasix daily. She is compliant with all medications.     Her current medication regimen includes amlodipine, carvedilol, furosemide, losartan, empagliflozin and eplerenone.  She denies medication side effects.  She does not bring a record of her home blood pressure determinations, but indicates that her systolic pressure is typically in the range of 130 to 140 mmHg when measured outside the clinic.    Cardiovascular History:  Hypertension  Heart failure with preserved ejection fraction    10-yr Cardiovascular Risk:  The 10-year ASCVD risk score (Arnett DK, et al., 2019) is: 32%    Values used to calculate the score:      Age: 6 years      Sex: Female      Is Non-Hispanic African American: No      Diabetic: Yes      Tobacco smoker: No      Systolic Blood Pressure: 130 mmHg      Is BP treated: Yes      HDL Cholesterol: 31 mg/dL      Total Cholesterol: 93 mg/dL    Past Medical & Surgical History:  Reviewed in EMR.    Allergies:  Reviewed in EMR.    Pertinent Medications (complete listing reviewed in EMR):    Prior to Admission medications    Medication Sig   acetaminophen (TYLENOL) 325 MG tablet Take 2 tablets (650 mg total) by mouth every four (4) hours as needed.   albuterol HFA 90 mcg/actuation inhaler Inhale 2 puffs every six (6) hours as needed for wheezing or shortness of breath.   allopurinol (ZYLOPRIM) 100 MG tablet TAKE 2 TABLETS BY MOUTH IN THE MORNING   amlodipine (NORVASC) 5 MG tablet Take 1 tablet by mouth once daily   aspirin (ECOTRIN) 81 MG tablet Take 1 tablet (81 mg total) by mouth daily.   carvedilol (COREG) 25 MG tablet Take 1 tablet by mouth twice daily   cholecalciferol, vitamin D3 25 mcg, 1,000 units,, 1,000 unit (25 mcg) tablet Take 1 tablet (25 mcg total) by mouth daily.   empagliflozin (JARDIANCE) 10 mg tablet Take 1 tablet by mouth once daily   fluticasone propionate (FLONASE) 50 mcg/actuation nasal spray Use 1 spray(s) in each nostril twice daily   furosemide (LASIX) 40 MG tablet Take 1 tablet by mouth once daily   glucosamine-chondroitin 500-400 mg tablet Take 1 tablet by mouth Two (2) times a day.   hydroxyurea (HYDREA) 500 mg capsule Take 2 capsules by mouth once daily   losartan (COZAAR) 50 MG tablet Take 1 tablet by mouth once daily   metFORMIN (GLUCOPHAGE) 500 MG tablet TAKE 1 TABLET BY MOUTH ONCE DAILY FOR 7 DAYS AND THEN INCREASE TO 1 TABLET TWICE DAILY WITH MEALS   MYFORTIC 180 mg EC tablet Take 2 tablets (360 mg total) by mouth two (2) times a day.   OMEGA-3/DHA/EPA/FISH OIL (FISH OIL-OMEGA-3 FATTY ACIDS) 300-1,000 mg capsule Take 1 capsule (1 g total) by mouth.   pantoprazole (PROTONIX) 40 MG tablet Take 1 tablet by mouth once daily   pravastatin (PRAVACHOL) 40 MG tablet Take 1 tablet (40 mg total) by mouth daily.   PROGRAF 0.5 mg capsule Take 2 capsules by mouth twice daily   sodium bicarbonate 325 MG tablet Take 2 tablets by mouth twice daily   umeclidinium (INCRUSE ELLIPTA) 62.5 mcg/actuation inhaler Inhale 1 puff by mouth once daily   zolpidem (AMBIEN) 5 MG tablet Take 1 tablet (5 mg  total) by mouth nightly as needed.   eplerenone (INSPRA) 25 MG tablet Take 2 tablets (50 mg total) by mouth daily.       Review of Systems:  As noted above, the patient denies chest pain.  She continues to note dyspnea with modest levels of exertion, but has no orthopnea or nocturnal dyspnea.  She has not had palpitations, near-syncope, or syncope.  She notes mild peripheral edema.    Physical Exam:  VITAL SIGNS: BP 130/60 (BP Site: L Arm, BP Position: Sitting, BP Cuff Size: Large)  - Ht 154.9 cm (5' 1)  - Wt 86.2 kg (190 lb) Comment: patient reported - SpO2 94% Comment: O2 running 3L - BMI 35.90 kg/m??   GENERAL: Appears comfortable.   NECK: Jugular venous pressure normal. Normal carotid upstrokes.  CARDIOVASCULAR: Apical impulse not palpable while supine.  Normal S1 and S2 with a regular rhythm.  No murmur.  No S3.  RESPIRATORY: Normal respiratory effort without use of accessory muscles. Clear to auscultation bilaterally.  EXTREMITIES:  Palpable dorsalis pedis and posterior tibial pulses.  Mild peripheral edema.    Pertinent Laboratory Studies: Lab Results   Component Value Date    Triglycerides 109 03/08/2023    Triglycerides 107 09/17/2014    HDL 31 (L) 03/08/2023    HDL 42 09/17/2014    Non-HDL Cholesterol 62 (L) 03/08/2023    LDL Calculated 40 03/08/2023    LDL Calculated 90 09/17/2014    Creatinine 1.57 (H) 07/20/2023    Creatinine 1.63 (H) 09/26/2015    Potassium 4.0 07/20/2023    Potassium 4.7 09/26/2015

## 2023-08-12 NOTE — Unmapped (Addendum)
Great to see you in clinic today! Here is what we discussed:     - Sorry you are having worsened symptoms recently.  Let's get another ultrasound of the heart called an echocardiogram in the next few weeks    - I will talk with Dr. Ala Dach about pursuing a right heart catheterization to get more information     Please monitor your blood pressure at home using the following guidelines:    Don???t measure your blood pressure within one hour of smoking, drinking alcohol, eating, or vigorous exercise.    Sit comfortably in a chair with your legs uncrossed and back supported, with your arm resting on a counter-top or table at about the level of your heart.    Remove clothing from the arm before applying the cuff.    Rest for 5 minutes before making your first measurement.  Relax and don???t talk during measurements.    Make one set of blood pressure readings in the morning before medications and another in the late afternoon or early evening; do this three days out of each week, and then every day for the week before your next clinic appointment.    For each set of blood pressure readings???Marland KitchenMarland KitchenAfter resting for five minutes, make a blood pressure measurement and record the results.  Wait for one minute and repeat.    Record every measurement (even if you think it???s incorrect!).    Don???t forget to bring your record of blood pressure readings and your monitor to your next clinic appointment.

## 2023-08-19 ENCOUNTER — Inpatient Hospital Stay: Admit: 2023-08-19 | Discharge: 2023-08-20 | Payer: MEDICARE

## 2023-08-25 DIAGNOSIS — Z940 Kidney transplant status: Principal | ICD-10-CM

## 2023-08-25 DIAGNOSIS — Z79899 Other long term (current) drug therapy: Principal | ICD-10-CM

## 2023-08-26 ENCOUNTER — Ambulatory Visit
Admit: 2023-08-26 | Discharge: 2023-08-28 | Disposition: A | Discharge status: Home / Self Care | Payer: MEDICARE | Admitting: Nephrology

## 2023-08-26 ENCOUNTER — Inpatient Hospital Stay
Admit: 2023-08-26 | Discharge: 2023-08-28 | Disposition: A | Discharge status: Home / Self Care | Payer: MEDICARE | Admitting: Nephrology

## 2023-08-26 ENCOUNTER — Ambulatory Visit
Admit: 2023-08-26 | Discharge: 2023-08-26 | Disposition: A | Discharge status: Home / Self Care | Payer: MEDICARE | Attending: Nephrology | Admitting: Nephrology | Primary: Nephrology

## 2023-08-26 DIAGNOSIS — Z940 Kidney transplant status: Principal | ICD-10-CM

## 2023-08-26 DIAGNOSIS — E876 Hypokalemia: Principal | ICD-10-CM

## 2023-08-26 DIAGNOSIS — D849 Immunodeficiency, unspecified: Principal | ICD-10-CM

## 2023-08-26 DIAGNOSIS — Z48298 Encounter for aftercare following other organ transplant: Principal | ICD-10-CM

## 2023-08-26 DIAGNOSIS — Z79899 Other long term (current) drug therapy: Principal | ICD-10-CM

## 2023-08-26 DIAGNOSIS — Z85828 Personal history of other malignant neoplasm of skin: Principal | ICD-10-CM

## 2023-08-26 LAB — COMPREHENSIVE METABOLIC PANEL
ALBUMIN: 3.4 g/dL (ref 3.4–5.0)
ALBUMIN: 3.8 g/dL (ref 3.4–5.0)
ALKALINE PHOSPHATASE: 67 U/L (ref 46–116)
ALKALINE PHOSPHATASE: 72 U/L (ref 46–116)
ALT (SGPT): 15 U/L (ref 10–49)
ALT (SGPT): 14 U/L (ref 10–49)
ANION GAP: 14 mmol/L (ref 5–14)
ANION GAP: 14 mmol/L (ref 5–14)
AST (SGOT): 16 U/L (ref <=34)
AST (SGOT): 14 U/L (ref <=34)
BILIRUBIN TOTAL: 0.3 mg/dL (ref 0.3–1.2)
BILIRUBIN TOTAL: 0.3 mg/dL (ref 0.3–1.2)
BLOOD UREA NITROGEN: 69 mg/dL — ABNORMAL HIGH (ref 9–23)
BLOOD UREA NITROGEN: 70 mg/dL — ABNORMAL HIGH (ref 9–23)
BUN / CREAT RATIO: 31
BUN / CREAT RATIO: 31
CALCIUM: 9.5 mg/dL (ref 8.7–10.4)
CALCIUM: 9.9 mg/dL (ref 8.7–10.4)
CHLORIDE: 97 mmol/L — ABNORMAL LOW (ref 98–107)
CHLORIDE: 99 mmol/L (ref 98–107)
CO2: 27 mmol/L (ref 20.0–31.0)
CO2: 27 mmol/L (ref 20.0–31.0)
CREATININE: 2.24 mg/dL — ABNORMAL HIGH (ref 0.55–1.02)
CREATININE: 2.29 mg/dL — ABNORMAL HIGH (ref 0.55–1.02)
EGFR CKD-EPI (2021) FEMALE: 23 mL/min/{1.73_m2} — ABNORMAL LOW (ref >=60)
EGFR CKD-EPI (2021) FEMALE: 22 mL/min/{1.73_m2} — ABNORMAL LOW (ref >=60)
GLUCOSE RANDOM: 162 mg/dL (ref 70–179)
GLUCOSE RANDOM: 169 mg/dL (ref 70–179)
POTASSIUM: 3.3 mmol/L — ABNORMAL LOW (ref 3.4–4.8)
POTASSIUM: 3.7 mmol/L (ref 3.4–4.8)
PROTEIN TOTAL: 6.9 g/dL (ref 5.7–8.2)
PROTEIN TOTAL: 7.4 g/dL (ref 5.7–8.2)
SODIUM: 138 mmol/L (ref 135–145)
SODIUM: 140 mmol/L (ref 135–145)

## 2023-08-26 LAB — URINALYSIS WITH MICROSCOPY
BACTERIA: NONE SEEN /HPF
BILIRUBIN UA: NEGATIVE
BLOOD UA: NEGATIVE
GLUCOSE UA: 300 — AB
HYALINE CASTS: 4 /LPF — ABNORMAL HIGH (ref 0–1)
KETONES UA: NEGATIVE
LEUKOCYTE ESTERASE UA: NEGATIVE
NITRITE UA: NEGATIVE
PH UA: 5 (ref 5.0–9.0)
PROTEIN UA: NEGATIVE
RBC UA: 3 /HPF (ref <=4)
SPECIFIC GRAVITY UA: 1.011 (ref 1.003–1.030)
SQUAMOUS EPITHELIAL: <1 /HPF (ref 0–5)
UROBILINOGEN UA: <2
WBC UA: 1 /HPF (ref 0–5)

## 2023-08-26 LAB — CBC W/ AUTO DIFF
BASOPHILS ABSOLUTE COUNT: 0 10*9/L (ref 0.0–0.1)
BASOPHILS ABSOLUTE COUNT: 0 10*9/L (ref 0.0–0.1)
BASOPHILS RELATIVE PERCENT: 0.4 %
BASOPHILS RELATIVE PERCENT: 0.1 %
EOSINOPHILS ABSOLUTE COUNT: 0 10*9/L (ref 0.0–0.5)
EOSINOPHILS ABSOLUTE COUNT: 0 10*9/L (ref 0.0–0.5)
EOSINOPHILS RELATIVE PERCENT: 0.4 %
EOSINOPHILS RELATIVE PERCENT: 0.4 %
HEMATOCRIT: 29.9 % — ABNORMAL LOW (ref 34.0–44.0)
HEMATOCRIT: 32.3 % — ABNORMAL LOW (ref 34.0–44.0)
HEMOGLOBIN: 10 g/dL — ABNORMAL LOW (ref 11.3–14.9)
HEMOGLOBIN: 11.1 g/dL — ABNORMAL LOW (ref 11.3–14.9)
LYMPHOCYTES ABSOLUTE COUNT: 0.6 10*9/L — ABNORMAL LOW (ref 1.1–3.6)
LYMPHOCYTES ABSOLUTE COUNT: 0.6 10*9/L — ABNORMAL LOW (ref 1.1–3.6)
LYMPHOCYTES RELATIVE PERCENT: 5 %
LYMPHOCYTES RELATIVE PERCENT: 6.2 %
MEAN CORPUSCULAR HEMOGLOBIN CONC: 33.6 g/dL (ref 32.0–36.0)
MEAN CORPUSCULAR HEMOGLOBIN CONC: 34.4 g/dL (ref 32.0–36.0)
MEAN CORPUSCULAR HEMOGLOBIN: 39.8 pg — ABNORMAL HIGH (ref 25.9–32.4)
MEAN CORPUSCULAR HEMOGLOBIN: 40.9 pg — ABNORMAL HIGH (ref 25.9–32.4)
MEAN CORPUSCULAR VOLUME: 118.6 fL — ABNORMAL HIGH (ref 77.6–95.7)
MEAN CORPUSCULAR VOLUME: 118.6 fL — ABNORMAL HIGH (ref 77.6–95.7)
MEAN PLATELET VOLUME: 8 fL (ref 6.8–10.7)
MEAN PLATELET VOLUME: 7.9 fL (ref 6.8–10.7)
MONOCYTES ABSOLUTE COUNT: 0.9 10*9/L — ABNORMAL HIGH (ref 0.3–0.8)
MONOCYTES ABSOLUTE COUNT: 0.9 10*9/L — ABNORMAL HIGH (ref 0.3–0.8)
MONOCYTES RELATIVE PERCENT: 7.4 %
MONOCYTES RELATIVE PERCENT: 8.8 %
NEUTROPHILS ABSOLUTE COUNT: 10.1 10*9/L — ABNORMAL HIGH (ref 1.8–7.8)
NEUTROPHILS ABSOLUTE COUNT: 8.6 10*9/L — ABNORMAL HIGH (ref 1.8–7.8)
NEUTROPHILS RELATIVE PERCENT: 86.8 %
NEUTROPHILS RELATIVE PERCENT: 84.5 %
PLATELET COUNT: 1453 10*9/L (ref 150–450)
PLATELET COUNT: 1542 10*9/L (ref 150–450)
RED BLOOD CELL COUNT: 2.52 10*12/L — ABNORMAL LOW (ref 3.95–5.13)
RED BLOOD CELL COUNT: 2.73 10*12/L — ABNORMAL LOW (ref 3.95–5.13)
RED CELL DISTRIBUTION WIDTH: 15 % (ref 12.2–15.2)
RED CELL DISTRIBUTION WIDTH: 14.8 % (ref 12.2–15.2)
WBC ADJUSTED: 11.7 10*9/L — ABNORMAL HIGH (ref 3.6–11.2)
WBC ADJUSTED: 10.2 10*9/L (ref 3.6–11.2)

## 2023-08-26 LAB — MAGNESIUM: MAGNESIUM: 2.1 mg/dL (ref 1.6–2.6)

## 2023-08-26 LAB — BK VIRUS QUANTITATIVE PCR, BLOOD: BK BLOOD RESULT: NOT DETECTED

## 2023-08-26 LAB — PROTEIN / CREATININE RATIO, URINE
CREATININE, URINE: 71.2 mg/dL
PROTEIN URINE: 20 mg/dL
PROTEIN/CREAT RATIO, URINE: 0.281

## 2023-08-26 LAB — PHOSPHORUS
PHOSPHORUS: 4 mg/dL (ref 2.4–5.1)
PHOSPHORUS: 3.8 mg/dL (ref 2.4–5.1)

## 2023-08-26 LAB — ALBUMIN / CREATININE URINE RATIO
ALBUMIN QUANT URINE: 1.6 mg/dL
ALBUMIN/CREATININE RATIO: 22.5 ug/mg (ref 0.0–30.0)
CREATININE, URINE: 71 mg/dL

## 2023-08-26 LAB — HIGH SENSITIVITY TROPONIN I - SERIAL: HIGH SENSITIVITY TROPONIN I: 10 ng/L (ref <=34)

## 2023-08-26 LAB — CMV DNA, QUANTITATIVE, PCR: CMV VIRAL LD: NOT DETECTED

## 2023-08-26 NOTE — Unmapped (Signed)
 ED Progress Note    [facility] Emergency Department Progress Note      As the oncoming resident, I received sign-out from the previous provider and have assumed care of this patient at shift change.    Patient Summary: Erin Welch is a 73 y.o. female with past medical history of essential thrombocytosis and history of renal transplant who presents to the emergency department with cough, nausea, vomiting, diarrhea, and AKI.  Patient has received fluids.  Patient has a history of a renal transplant for which she is on immunosuppressant.  Patient recently started on new medication which could contribute to the AKI.  At time of signout, pending bilateral lower extremity Doppler, VQ scan, and chest x-ray as well as final renal transplant recommendations.  Workup obtained included CBC, CMP, troponin, phosphorus, tacrolimus level, C. difficile, GI pathogen panel, urinalysis, RPP.  Bilateral venous Dopplers, renal transplant with Doppler ultrasound, V/Q study likely to be completed tomorrow.  Workup thus far with elevated platelets at 1500 which is consistent with baseline, AKI with a creatinine of 2.29 from 1.57.  Troponin within normal limits.  Phosphorus within normal limits.  RPP negative.  Action List:   Follow-up on renal transplant recommendations  Follow-up on BLE Doppler, renal transplant Doppler, chest x-ray, VQ scan    ED Course as of 08/27/23 1055   Sat Aug 27, 2023   0218 US Renal Transplant W Doppler  IMPRESSION:  --Decreased resistive indices in the renal transplant arteries compared to 02/28/2023, now only mildly elevated.  --Decreased velocities in the main renal artery, now measuring up to 0.9 m/s at the anastomosis and previously 4.4 m/s.  --Redemonstrated patchy power Doppler perfusion in the upper pole of the transplant kidney.  --Partially distended bladder with calculated volume of 322.8 mL. Patient unable to void per sonographer report.     1308 US Venous Doppler Lower Extremity Bilateral  IMPRESSION:  No deep vein thrombosis in the bilateral lower extremities.     0220 I have paged transplant nephrology.   [id number] I have paged [facility] for admission.   [id number] I have placed standing orders for amlodipine, carvedilol, tacrolimus, and mycophenolate as provided by nephrology.  Additionally ordered CMV and EBV antibody panel, BMP to be collected in the a.m. to recheck renal function.       Additional Medical Decision Making     This patient's case/presentation was discussed with the following: Transplant nephrology

## 2023-08-26 NOTE — Unmapped (Signed)
 Transplant Nephrology Clinic Visit    Assessment and Plan    Erin Welch is a 73 y.o. 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 She is seen for follow up of her transplant and associated medical problems and has AKI, platelet count >1.4 million, flank/low back pain with inspiration and cough, and recent onset nausea and vomiting. She is being sent to the ED for evaluation of possible PE.     # Flank pain with cough and inspiration, rule out PE  - These symptoms have been present for 2 weeks.   - Echocardiogram shows severe pulmonary HTN on 08/19/23. Prior echocardiograms have shown much lesser degrees of pulmonary HTN  - Platelet count of 1,493,000 is a risk factor for  thromboembolism  - Will send to ED for LE dopplers, CXR and depending on those possibly PE protocol CT versus ventilation-perfusion study    # Status post kidney transplant   # AKI  - Serum creatinine is 2.2 mg/dL and above her baseline of <1.7 mg/dL.   - GI symptoms combined with continued diuretic use could cause volume depletion associated rise in creatinine. An alternative explanation may include acute tacrolimus nephrotoxicity due to diarrhea combined with recent initiation of pacritinib. Less likely etiologies would be thrombosis of renal vein/artery, ATN, UTI, or  rejection. She has no prior history of rejection or history of DSAs (most recent screen 05/16/23) so rejection is unlikely.   - Will be at risk of worsening renal function with IV contrast. IV fluid administration prior to contrast may be warranted if PE protocol CT is necessary.   - Would hold losartan, Jardiance, eplerenone, furosemide for now  - Would check renal US with dopplers    # Immunosuppression management.   - Tacrolimus level today is a 14 hour post dose level so should be informative of possible tacrolimus toxicity (trough target 4-7).   - Would reduce tacrolimus to 0.5 mg bid while awaiting trough level that will likely not be resulted until tomorrow PM. Dose adjustment can then be made.  - Myfortic 360 mg bid can be continued now that GI symptoms are improved    # Nausea,vomiting, diarrhea  - This symptom complex started approximately 24 hours ago and is already improved  - May be contributing to AKI.   - If diarrhea present would check GIPP, C.diff  - Check CMV and EBV viral load    Essential thrombocythemia, CALR mutated.   - Platelet count 1,453,000   - Had recent transition from hydroxyurea to pacritinib    - Bone marrow biopsy 05/09/23 with hypercellular marrow with features of ET with 3% blasts. Has known CALR mutation   - Dr. Anise Salvo and Synetta Fail, AGNP were   informed of plan to send patient to ED     # History of recurrent UTI.    - She currently has no symptoms of UTI    - UA and urine culture was not obtained in clinic. Can be done in ED    # Hypertension management  # Adrenal Adenoma  - BP today is 128/62  - Right adrenal adenoma 3.0 cm in size noted on chest CT 07/16/22 (was 2.7 cm 12/27/2014).  - Would hold eplerenone, losartan, furosemide  - Would continue amlodipine 5 mg daily, carvedilol 25 mg BID  - Will not plan to pursue intervention on adrenal adenoma given excellent BP control previously     #  Hypokalemia, metabolic acidosis, hypomagnesemia  - K 3.3  - Mg normalized on Jardiance  - Supplement prn and continue NaHCO3    Macrocytic anemia.  - Hemoglobin 10.0   - Iron stores have been adequate, B12 normal  - SPEP with immunofixation revealed a low level IgA kappa monoclonal antibody. If proteinuria persists may need to investigate for possible MGRS.     Right middle lobe lung nodule, possible stage 1 lung CA  - PET CT scan 08/01/20 with mildly hypermetabolic right middle lobe nodule. This was done for follow up on a pulmonary nodule noted on chest CT in 01/2020. Repeat chest CT 01/20/21 revealed stable 1.3 cm RML nodule. CT 09/18/21 after radiation revealed reduction in size to 0.8 cm.   - Radiation therapy with 1800cGy x 3 fractions completed 06/19/21.   - Follow up chest CT most recently 07/22/23 revealed stable nodule in RML without evidence of recurrence     History of COPD and pulmonary HTN with sleep disordered breathing.   - Echocardiogram reveals severe pulmonary HTN on 08/19/23 echocardiogram  - She will continue to follow up with Dr. Ala Dach and Dr. Emogene Morgan  - On CPAP, Flonase, inhalers, home O2     History of HFpEF with LV diastolic dysfunction.   - On furosemide, losartan, eplerenone, carvedilol, amlodipine prior to today  - Follow up with Dr. Emogene Morgan.    DM, type 2  - On metformin, Jardiance   - Managed by Dr. Rich Brave, [facility]    Gout.  - Allopurinol 100 mg daily to be continued    Bilateral knee osteoarthritis.   - Surgery has been deferred in light of recent illnesses.     Immunizations.   -  Influenza vaccine 04/14/23   - Shingrix x 2 completed (04/15/17, 10/11/17).   - COVID-19 04/14/23  - Pneumovax 23 07/09/20.  Prevnar 13 04/16/14  - RSV vaccine 02/25/22    Cancer screening.    - She sees a dermatologist regularly for history of NMSC.   - Dr. Rich Brave noted a scalp lesion. Need Dermatology follow up  - RUS 12/31/22 no renal masses     Asymptomatic cholelithiasis noted on multiple prior ultrasound studies.  - A large (3.6 cm) gallstone confirmed on 05/11/20 [place]  - No further therapy is presently warranted per surgery despite the large stone noted (3.6 cm).   - Denies symptoms related to cholelithiasis    Plan Summary and Follow-up.   - To ED for further evaluation, rule out DVT/PE, CXR, renal US, UA and culture  - Reduce tacrolimus to 0.5 mg bid while awaiting level  - If admitted would temporarily hold losartan, eplerenone, furosemide      History of Present Illness  Erin Welch is 73 y.o.and received a deceased donor kidney transplant on 04/04/2013.and received a deceased donor kidney transplant on 04/04/2013. She has no history of rejection, proteinuria or DSAs.      She was hospitalized 12/15/20-12/17/20 for parainfluenza associated pneumonia, AKI, and gout flare. Follow up chest CT on 01/20/21 revealed a stable 1.3 cm lung nodule, but also revealed new pulmonary infiltrates felt likely to be residual inflammation related to the parinfluenza infection. She was seen in pulmonary clinic 04/21/21 with concern that the lung nodule was slowly increasing in size and FDG avid. She was seen in radiation oncology clinic 05/21/21 with plans for empiric SBRT. She was not felt to be a candidate for lung biopsy. She started radiation therapy on 06/15/21 and completed 5400 cGy of therapy by 06/19/21. Follow up CT 09/18/21 revealed  the lung nodule had diminished in size to 8 mm. Active surveillance is now ongoing without further therapy with CT scans 01/08/22, 04/09/22, 07/16/22 and 01/28/23  showing evolving post-radiation fibrosis/pneumonitis changes.     She was hospitalized 02/28/23-03/03/23 after presenting with diarrhea of 2 weeks duration. GIPP and C.diff were negative. Admission labs included K 2.3, serum CO2 13, Na 133, creatinine 3.0, Mg 1.1, hemoglobin 9.1. She was treated with IV fluids, Mg and K replacement and sodium bicarbonate. Metformin, lasix, eplerenone, and losartan were all held. Sodium bicarbonate was increased to 650 mg tid. Myfortic was reduced to 180 mg bid on discharge and tacrolimus to 0.5 mg daily. She was also transfused 1 unit pRBCs on 03/01/23 and 1 unit on 03/08/23.     She was seen by Dr. Ala Dach 05/20/23 in pulmonary HTN clinic for pulmonary HTN related to diastolic dysfunction, COPD, and sleep apnea. Echocardiogram revealed no pulmonary HTN in August so no right heart cath was pursued.    She saw Dr. Anise Salvo on 05/16/23 after a bone marrow biopsy 05/09/23 revealed no progression to MF with CALR-mutated ET in chronic phase without MDS. Aspirin was continued and hydroxyurea reduced to ten 500 mg tabs per week. Due to anemia ESA therapy is being considered. A transition to the JAKi pacrtinib was made within the past 2 weeks.     Interval History  She presents today stating that 2 weeks ago she developed worsening cough. Within 48 hours she also had back pain that she localizes to both flanks. This pain is worse with coughing and inspiration. She denies hemoptysis or calf pain. She has chronic DOE and is on home O2. Her DOE has not worsened. Edema is present and at baseline. She denies fever, dysuria, or allograft tenderness.      Immunosuppression: tacrolimus 1 mg AM/0.5 mg PM, Myfortic 360 mg bid.    Transplant History:  1. ESRD of unknown etiology.   2. Deceased donor kidney transplant 04/03/2013. CMV High Risk: D+/R-. EBV: D+/R+. Implantation biopsy with mild to moderate arteriolar hyalinosis.  3. Thymooglobulin induction  4. Delayed graft function  5. Maintenance immunosuppression tacrolimus, mycophenolate, prednisone.  6. 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 Recent screens have been negative as of 09/21/16.  7. Baseline creatinine 1.3-1.6 mg/dL.    Previous Medical History:  1. Native kidney disease unknown etiology  2. Pulmonary hypertension, controlled.  3. Heart failure with preserved left ventricular function., controlled.  4. Post-traumatic osteoarthritis of bilateral knee.  5. Anemia of chronic kidney failure, hydroxyurea dosing.  6. Essential thrombocytosis.  7. History of gout.  8. History of COPD, well-compensated.  9. OSA  10. Small intestine AVMs  11. Covid Infection (BAM on 07/12/19)  12. Lung nodule, probable lung CA, s/p RT 06/2021  13. Adrenal adenoma    Review of Systems    All other systems are reviewed and are negative. A 10 systems review was completed.    Medications  Current Outpatient Medications   Medication Sig Dispense Refill    acetaminophen (TYLENOL) 325 MG tablet Take 2 tablets (650 mg total) by mouth every four (4) hours as needed.      albuterol HFA 90 mcg/actuation inhaler Inhale 2 puffs every six (6) hours as needed for wheezing or shortness of breath. 1 Inhaler 0    allopurinol (ZYLOPRIM) 100 MG tablet TAKE 2 TABLETS BY MOUTH IN THE MORNING 180 tablet 0    amlodipine (NORVASC) 5 MG tablet Take 1 tablet by mouth once daily 90 tablet 0  aspirin (ECOTRIN) 81 MG tablet Take 1 tablet (81 mg total) by mouth daily.      carvedilol (COREG) 25 MG tablet Take 1 tablet by mouth twice daily 180 tablet 0    cephalexin (KEFLEX) 250 MG capsule Take 1 capsule by mouth once daily 90 capsule 0    cholecalciferol, vitamin D3 25 mcg, 1,000 units,, 1,000 unit (25 mcg) tablet Take 1 tablet (25 mcg total) by mouth daily. 90 tablet 3    colchicine (COLCRYS) 0.6 mg tablet Take 1 tablet (0.6 mg total) by mouth two (2) times a day as needed. For acute gout, take 2 tablets by mouth, then 1 tablet one hour later on first day. May continue 1 tablet up to twice a day as needed.      empagliflozin (JARDIANCE) 10 mg tablet Take 1 tablet by mouth once daily 90 tablet 3    eplerenone (INSPRA) 25 MG tablet Take 2 tablets (50 mg total) by mouth daily.      epoetin alfa (EPOGEN,PROCRIT) 40,000 unit/mL injection Inject 1 mL (40,000 Units total) under the skin once a week. 4 mL 11    fluticasone propionate (FLONASE) 50 mcg/actuation nasal spray Use 1 spray(s) in each nostril twice daily 16 g 11    furosemide (LASIX) 40 MG tablet Take 1 tablet (40 mg total) by mouth two (2) times a day. 180 tablet 3    glucosamine-chondroitin 500-400 mg tablet Take 1 tablet by mouth Two (2) times a day.      hydroxyurea (HYDREA) 500 mg capsule Take 2 capsules by mouth once daily 180 capsule 1    losartan (COZAAR) 50 MG tablet Take 1 tablet by mouth once daily 90 tablet 0    MEDICAL SUPPLY ITEM 14 cm into each nostril nightly. CPAP Auto titrating machine 14cmH2O 3PR  [employer] is patient's DME provider.      metFORMIN (GLUCOPHAGE) 500 MG tablet Take 1 tablet (500 mg total) by mouth in the morning and 1 tablet (500 mg total) in the evening. Take with meals. 180 tablet 3    MYFORTIC 180 mg EC tablet Take 2 tablets (360 mg total) by mouth two (2) times a day. 120 tablet 11    OMEGA-3/DHA/EPA/FISH OIL (FISH OIL-OMEGA-3 FATTY ACIDS) 300-1,000 mg capsule Take 1 capsule (1 g total) by mouth.      pacritinib (VONJO) 100 mg capsule Take 1 capsule (100 mg total) by mouth two (2) times a day. Swallow capsules whole. Do not open, break, or chew capsules. 120 capsule 0    pantoprazole (PROTONIX) 40 MG tablet Take 1 tablet by mouth once daily 90 tablet 0    pravastatin (PRAVACHOL) 40 MG tablet Take 1 tablet (40 mg total) by mouth daily. 90 tablet 3    PROGRAF 0.5 mg capsule Take 1 capsule (0.5 mg total) by mouth two (2) times a day. 60 capsule 1    umeclidinium (INCRUSE ELLIPTA) 62.5 mcg/actuation inhaler Inhale 1 puff by mouth once daily 90 each 3    zolpidem (AMBIEN) 5 MG tablet Take 1 tablet (5 mg total) by mouth nightly as needed. 30 tablet 3    sodium bicarbonate 650 mg tablet Take 2 tablets (1,300 mg total) by mouth two (2) times a day. NEW DOSE 360 tablet 3    torsemide (DEMADEX) 20 MG tablet Take 3 tablets (60 mg total) by mouth daily. Do not take furosemide if you are taking torsemide. (Patient not taking: Reported on 08/12/2023) 90 tablet 6  No current facility-administered medications for this visit.       Physical Exam  BP 128/61 (BP Site: L Arm, BP Position: Sitting, BP Cuff Size: Large)  - Pulse 62  - Temp 36.7 ??C (98 ??F) (Temporal)  - Ht 154.9 cm (5' 0.98)  - Wt 88.5 kg (195 lb)  - BMI 36.86 kg/m??   General: Patient is a pleasant female in no apparent distress.  Eyes: Sclera anicteric.  Neck: Supple without LAD/JVD/bruits.  Lungs: Clear to auscultation bilaterally, no wheezes/rales/rhonchi.  Cardiovascular:  A grade 2/6 midsystolic murmur is audible. .  Abdomen: Soft, notender/nondistended. Positive bowel sounds. No hepatosplenomegaly, masses or bruits appreciated.  Extremities: Without edema, joints without evidence of synovitis  Skin: Without rash  Neurological:  Patient is alert and oriented.  There are no motor or sensory deficits. .  Psychiatric: Mood and affect appropriate.      Laboratory Results and Imaging Reviewed in EMR

## 2023-08-26 NOTE — Unmapped (Signed)
 [facility]  Emergency Department Provider Note     ED Clinical Impression     Final diagnoses:   Acute cough (Primary)   Flank pain   Nausea and vomiting, unspecified vomiting type   Diarrhea, unspecified type   AKI (acute kidney injury) (CMS-HCC)   Renal transplant recipient      Impression, Medical Decision Making, ED Course     Impression: 73 y.o. 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 who presents with flank/low back pain with inspiration cough, recent onset nausea vomiting, concern for PE as described below.     Upon evaluation in ED: Patient laying comfortably in bed. Does not appear to be in acute distress. Cardiopulmonary exam is unremarkable. Abdominal exam is unremarkable. No lower extremity edema. No obvious rashes. Neuro exam grossly intact.     DDx/MDM: AKI, transplant rejection, UTI, pyelonephritis, nephrolithiasis, hydronephrosis, COVID, flu, RSV, pneumonia, pulmonary embolism, among other etiologies.  Patient has had 2 weeks of cough, 24 hours of nausea, vomiting.  Has a history of essential thrombocytosis, placing her at high risk for PE.  She is immunocompromise, tacrolimus, Myfortic.  Recently started a new medication for her essential thrombocytosis, which could be contributing to AKI.  Will get bilateral venous Dopplers, renal transplant with Doppler ultrasound, also spoke to radiology and ordered lung ventilation/perfusion study likely to be completed tomorrow.  Ordered CBC, CMP, troponin, phosphorus, tacrolimus level, C. difficile, GI pathogen panel, urinalysis.  She will be signed out to oncoming provider pending further workup.  Did give a liter of fluids.    Diagnostic workup as below. Will treat patient with fluids.    Orders Placed This Encounter   Procedures    GI Pathogen Panel (instead of Stool O&P)    Clostridium difficile Assay    Respiratory Pathogen Panel    US Venous Doppler Lower Extremity Bilateral    XR Chest 2 views    US Renal Transplant W Doppler    NM Lung Ventilation Perfusion    CBC w/ Differential    hsTroponin I (serial 0-2-6H w/ delta)    Comprehensive Metabolic Panel    Phosphorus Level    Urinalysis with Microscopy with Culture Reflex    hsTroponin I - 2 Hour    hsTroponin I - 6 Hour    Tacrolimus Level, Trough    ECG 12 Lead       ED Course as of 08/26/23 2233   Fri Aug 26, 2023   2121 Sign out    2200 Comprehensive Metabolic Panel(!):    Sodium 140   Potassium 3.7   Chloride 99   CO2 27.0   Anion Gap 14   Bun 70(!)   Creatinine 2.29(!)   BUN/Creatinine Ratio 31   eGFR CKD-EPI (2021) Female 22(!)   Glucose 169   Calcium 9.9   Albumin 3.8   Total Protein 7.4   Total Bilirubin 0.3   SGOT (AST) 14   ALT 14   Alkaline Phosphatase 72  Creatinine is slightly worsened.   2231 XR Chest 2 views  IMPRESSION:     Pulmonary vascular congestion.  Cardiomegaly.         MDM Elements    Independent Interpretation of Studies: X-ray(s) - pulmonary vascular congestion, cardiomegaly  I have reviewed recent and relevant previous record, including: Outpatient notes - transplant nephrology notes      Prescription drugs considered but not prescribed: Antibiotics - not actively concern for sepsis at this time    ____________________________________________    The case was discussed with the attending physician, who is in agreement with the above assessment and plan.      History     Chief Complaint  Chief Complaint   Patient presents with    Flank Pain    Shortness of Breath       HPI   Erin Welch is a 73 y.o. female with past medical history as below who presents with cough, shortness of breath, flank pain. She has had the symptoms for 2 weeks.  Echocardiogram shows severe pulmonary hypertension on 08/19/2023.  This is significantly worse from prior echocardiograms.  Platelet count of 1.5 million, high risk for thromboembolism.  Sent in by transplant nephrology for lower extremity Dopplers, chest x-ray, likely CT PE.  Versus ventilation/perfusion study.  She is status post kidney transplant.  Has an AKI on this.  Baseline is less than 1.7, now 2.2.  She has had GI symptoms and diuretic use, could be volume depletion.  Also could be acute tacrolimus nephrotoxicity.  Recent initiation of pacritinib.  No history of rejection.  They recommend IV fluid administration prior to contrast if necessary.  She is on Myfortic as well.  She has had nausea, vomiting, diarrhea.  They ordered CMV and viral load.    Received deceased donor kidney transplant on 04/04/2013.  She was hospitalized in 2022 for parainfluenza associated pneumonia, AKI, gout flare.  CT showed stable lung nodule, but new pulmonary infiltrate.  Follow-up in pulm clinic showed that the lung nodule slowly increasing inside.  She received therapy for this.  They did not complete a lung biopsy.  Completed radiation.  She was hospitalized in August of this year after presenting with 2 weeks of diarrhea, GI PP and C. difficile are negative at that time.  Creatinine resulted to 3.0.  Had received a blood transfusion at that time.  Her AT is in chronic phase.      Past Medical History:   Diagnosis Date    Actinic keratosis     Acute injury of anterior cruciate ligament     Allergic Myfodic    Anemia, iron deficiency     AVM (arteriovenous malformation)     small bowel diagnosed by capsule camer Basal cell carcinoma 2018    Cataract     Chronic kidney disease     Clotting disorder (CMS-HCC)     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    Diabetes 2 09/02/2022    Diastolic CHF (CMS-HCC)     RHC 10/2012: PCWP 25 MPAP 40 PVR 2.4    Dyspnea on exertion 02/07/2015    Essential thrombocytosis (CMS-HCC) 2005    Heart disease 07/12/2014    History of renal transplant 03/2013    complicated by delayed onset graft function    Hypertension     Inverted nipple 01/19/2021    bilateral and pt states always have been inverted    Joint pain     Obstructive sleep apnea     on home CPAP therapy    Osteoarthritis  Pulmonary edema 01/07/2013    Pulmonary hypertension (CMS-HCC)     Squamous cell skin cancer        Past Surgical History:   Procedure Laterality Date    HAND SURGERY      PR CYSTOSCOPY,REMV CALCULUS,SIMPLE  05/15/2013    Procedure: CYSTOURETHROSCOPY, WITH REMOVAL OF FOREIGN BODY, CALCULUS OR URETERAL STENT FROM URETHRA OR BLADDER; SIMPLE;  Surgeon: Randalyn Rhea, MD;  Location: MAIN OR [facility];  Service: Urology    PR REMOVAL TUNNELED INTRAPERITONEAL CATHETER N/A 05/15/2013    Procedure: REMOVAL OF PERMANENT INTRAPERITONEAL CANNULA OR CATHETER;  Surgeon: Purcell Mouton, MD;  Location: [facility];  Service: Transplant    PR RIGHT HEART CATH O2 SATURATION & CARDIAC OUTPUT N/A 03/24/2015    Procedure: Right Heart Catheterization;  Surgeon: Mittie Bodo, MD;  Location: [facility] CATH;  Service: Cardiology    PR TRANSPLANT,PREP CADAVER RENAL GRAFT N/A 04/04/2013    Procedure: [facility] STD PREP CAD DONR RENAL ALLOGFT PRIOR TO TRNSPLNT, INCL DISSEC/REM PERINEPH FAT, DIAPH/RTPER ATTAC;  Surgeon: Vivi Barrack, MD;  Location: MAIN OR [place];  Service: Transplant    PR TRANSPLANTATION OF KIDNEY N/A 04/04/2013    Procedure: RENAL ALLOTRANSPLANTATION, IMPLANTATION OF GRAFT; WITHOUT RECIPIENT NEPHRECTOMY;  Surgeon: Vivi Barrack, MD;  Location: MAIN OR [place];  Service: Transplant    SKIN BIOPSY      knee and back        No current facility-administered medications for this encounter.    Current Outpatient Medications:     acetaminophen (TYLENOL) 325 MG tablet, Take 2 tablets (650 mg total) by mouth every four (4) hours as needed., Disp: , Rfl:     albuterol HFA 90 mcg/actuation inhaler, Inhale 2 puffs every six (6) hours as needed for wheezing or shortness of breath., Disp: 1 Inhaler, Rfl: 0    allopurinol (ZYLOPRIM) 100 MG tablet, TAKE 2 TABLETS BY MOUTH IN THE MORNING, Disp: 180 tablet, Rfl: 0    amlodipine (NORVASC) 5 MG tablet, Take 1 tablet by mouth once daily, Disp: 90 tablet, Rfl: 0    aspirin (ECOTRIN) 81 MG tablet, Take 1 tablet (81 mg total) by mouth daily., Disp: , Rfl:     carvedilol (COREG) 25 MG tablet, Take 1 tablet by mouth twice daily, Disp: 180 tablet, Rfl: 0    cephalexin (KEFLEX) 250 MG capsule, Take 1 capsule by mouth once daily, Disp: 90 capsule, Rfl: 0    cholecalciferol, vitamin D3 25 mcg, 1,000 units,, 1,000 unit (25 mcg) tablet, Take 1 tablet (25 mcg total) by mouth daily., Disp: 90 tablet, Rfl: 3    colchicine (COLCRYS) 0.6 mg tablet, Take 1 tablet (0.6 mg total) by mouth two (2) times a day as needed. For acute gout, take 2 tablets by mouth, then 1 tablet one hour later on first day. May continue 1 tablet up to twice a day as needed., Disp: , Rfl:     empagliflozin (JARDIANCE) 10 mg tablet, Take 1 tablet by mouth once daily, Disp: 90 tablet, Rfl: 3    eplerenone (INSPRA) 25 MG tablet, Take 2 tablets (50 mg total) by mouth daily., Disp: , Rfl:     epoetin alfa (EPOGEN,PROCRIT) 40,000 unit/mL injection, Inject 1 mL (40,000 Units total) under the skin once a week., Disp: 4 mL, Rfl: 11    fluticasone propionate (FLONASE) 50 mcg/actuation nasal spray, Use 1 spray(s) in each nostril twice daily, Disp: 16 g, Rfl: 11    furosemide (LASIX) 40 MG tablet, Take 1  tablet (40 mg total) by mouth two (2) times a day., Disp: 180 tablet, Rfl: 3    glucosamine-chondroitin 500-400 mg tablet, Take 1 tablet by mouth Two (2) times a day., Disp: , Rfl:     hydroxyurea (HYDREA) 500 mg capsule, Take 2 capsules by mouth once daily, Disp: 180 capsule, Rfl: 1    losartan (COZAAR) 50 MG tablet, Take 1 tablet by mouth once daily, Disp: 90 tablet, Rfl: 0    MEDICAL SUPPLY ITEM, 14 cm into each nostril nightly. CPAP Auto titrating machine 14cmH2O 3PR  [employer] is patient's DME provider., Disp: , Rfl:     metFORMIN (GLUCOPHAGE) 500 MG tablet, Take 1 tablet (500 mg total) by mouth in the morning and 1 tablet (500 mg total) in the evening. Take with meals., Disp: 180 tablet, Rfl: 3    MYFORTIC 180 mg EC tablet, Take 2 tablets (360 mg total) by mouth two (2) times a day., Disp: 120 tablet, Rfl: 11    OMEGA-3/DHA/EPA/FISH OIL (FISH OIL-OMEGA-3 FATTY ACIDS) 300-1,000 mg capsule, Take 1 capsule (1 g total) by mouth., Disp: , Rfl:     pacritinib (VONJO) 100 mg capsule, Take 1 capsule (100 mg total) by mouth two (2) times a day. Swallow capsules whole. Do not open, break, or chew capsules., Disp: 120 capsule, Rfl: 0    pantoprazole (PROTONIX) 40 MG tablet, Take 1 tablet by mouth once daily, Disp: 90 tablet, Rfl: 0    pravastatin (PRAVACHOL) 40 MG tablet, Take 1 tablet (40 mg total) by mouth daily., Disp: 90 tablet, Rfl: 3    PROGRAF 0.5 mg capsule, Take 1 capsule (0.5 mg total) by mouth two (2) times a day., Disp: 60 capsule, Rfl: 1    sodium bicarbonate 650 mg tablet, Take 2 tablets (1,300 mg total) by mouth two (2) times a day. NEW DOSE, Disp: 360 tablet, Rfl: 3    torsemide (DEMADEX) 20 MG tablet, Take 3 tablets (60 mg total) by mouth daily. Do not take furosemide if you are taking torsemide. (Patient not taking: Reported on 08/12/2023), Disp: 90 tablet, Rfl: 6    umeclidinium (INCRUSE ELLIPTA) 62.5 mcg/actuation inhaler, Inhale 1 puff by mouth once daily, Disp: 90 each, Rfl: 3    zolpidem (AMBIEN) 5 MG tablet, Take 1 tablet (5 mg total) by mouth nightly as needed., Disp: 30 tablet, Rfl: 3    Allergies  Macrobid [nitrofurantoin monohyd/m-cryst] and Nitrofurantoin    Family History  Family History   Problem Relation Age of Onset    Diabetes Mother     Dementia Mother 54    Coronary artery disease Father     Kidney failure Father     Kidney disease Father     Melanoma Neg Hx     Basal cell carcinoma Neg Hx     Squamous cell carcinoma Neg Hx     Stroke Neg Hx     Breast cancer Neg Hx     Colon cancer Neg Hx     Endometrial cancer Neg Hx     Ovarian cancer Neg Hx        Social History  Social History     Tobacco Use    Smoking status: Former     Current packs/day: 0.00     Average packs/day: 1 pack/day for 20.0 years (20.0 ttl pk-yrs)     Types: Cigarettes     Start date: 12/11/1983     Quit date: 12/11/2003     Years since quitting: 19.7  Smokeless tobacco: Never   Vaping Use    Vaping status: Never Used   Substance Use Topics    Alcohol use: Yes     Comment: monthly    Drug use: Never        Physical Exam     VITAL SIGNS:      Vitals:    08/26/23 1626 08/26/23 1630 08/26/23 2025 08/26/23 2215   BP:  142/59 132/56    Pulse: 64 62 63 66   Resp:  18 20 16    Temp:  36.4 ??C (97.5 ??F)     TempSrc:  Oral     SpO2: 90% 97% 96% 92%   Weight:  88 kg (194 lb)         Constitutional: Alert and oriented. No acute distress.  Eyes: Conjunctivae are normal.  HEENT: Normocephalic and atraumatic. Conjunctivae clear. No congestion. Moist mucous membranes.   Cardiovascular: Rate as above, regular rhythm. Normal and symmetric distal pulses. Brisk capillary refill. Normal skin turgor.  Respiratory: Normal respiratory effort. Breath sounds are normal. There are no wheezing or crackles heard.  Gastrointestinal: Soft, non-distended, non-tender.  Genitourinary: Deferred.  Musculoskeletal: Non-tender with normal range of motion in all extremities.  Neurologic: Normal speech and language. No gross focal neurologic deficits are appreciated. Patient is moving all extremities equally, face is symmetric at rest and with speech.  Skin: Skin is warm, dry and intact. No rash noted.  Psychiatric: Mood and affect are normal. Speech and behavior are normal.     Radiology     XR Chest 2 views   Final Result      Pulmonary vascular congestion.   Cardiomegaly.      US Venous Doppler Lower Extremity Bilateral    (Results Pending)   US Renal Transplant W Doppler    (Results Pending)   NM Lung Ventilation Perfusion    (Results Pending)       Pertinent labs & imaging results that were available during my care of the patient were independently interpreted by me and considered in my medical decision making (see chart for details).    Portions of this record have been created using Scientist, clinical (histocompatibility and immunogenetics). Dictation errors have been sought, but may not have been identified and corrected.         Leslye Peer, MD  Resident  08/26/23 [phone]

## 2023-08-26 NOTE — Unmapped (Signed)
 Sent in by provider for r/o PE. Flank pain w/ cough and inspiration x2 weeks. Also w/ AKI. Hx kidney transplant.

## 2023-08-26 NOTE — Unmapped (Signed)
 I spent 20 minutes with the patient at her clinic visit. I reviewed and updated her medications. She took her tacrolimus at 10pm last night. Her platelets are elevated.    Fever: No  Chills: No    Pain: yes back pain from cough last week      Painful urination:No    Nausea: No  Vomiting: No  Diarrhea: No    Chest Pain: No    Tremors: No  Headaches: No  Numbness/Tingling: No    Edema: No  SOB: No  Intake:   Output:    BP readings: 120's/60's  Dizziness/Lightheadedness:No    Other issues/concerns:

## 2023-08-27 LAB — TACROLIMUS LEVEL, TROUGH
TACROLIMUS, TROUGH: 4.1 ng/mL — ABNORMAL LOW (ref 5.0–15.0)
TACROLIMUS, TROUGH: 7 ng/mL (ref 5.0–15.0)
TACROLIMUS, TROUGH: 4.6 ng/mL — ABNORMAL LOW (ref 5.0–15.0)

## 2023-08-27 LAB — BASIC METABOLIC PANEL
ANION GAP: 14 mmol/L (ref 5–14)
BLOOD UREA NITROGEN: 69 mg/dL — ABNORMAL HIGH (ref 9–23)
BUN / CREAT RATIO: 31
CALCIUM: 9.9 mg/dL (ref 8.7–10.4)
CHLORIDE: 101 mmol/L (ref 98–107)
CO2: 26 mmol/L (ref 20.0–31.0)
CREATININE: 2.21 mg/dL — ABNORMAL HIGH (ref 0.55–1.02)
EGFR CKD-EPI (2021) FEMALE: 23 mL/min/{1.73_m2} — ABNORMAL LOW (ref >=60)
GLUCOSE RANDOM: 205 mg/dL — ABNORMAL HIGH (ref 70–179)
POTASSIUM: 3.9 mmol/L (ref 3.4–4.8)
SODIUM: 141 mmol/L (ref 135–145)

## 2023-08-27 LAB — APTT
APTT: 29 s (ref 24.8–38.4)
HEPARIN CORRELATION: 0.2

## 2023-08-27 LAB — HIGH SENSITIVITY TROPONIN I - 2 HOUR SERIAL
HIGH SENSITIVITY TROPONIN - DELTA (0-2H): 0 ng/L (ref <=7)
HIGH-SENSITIVITY TROPONIN I - 2 HOUR: 10 ng/L (ref <=34)

## 2023-08-27 LAB — URINALYSIS WITH MICROSCOPY WITH CULTURE REFLEX PERFORMABLE
BACTERIA: NONE SEEN /HPF
BILIRUBIN UA: NEGATIVE
BLOOD UA: NEGATIVE
GLUCOSE UA: 500 — AB
HYALINE CASTS: 3 /LPF — ABNORMAL HIGH (ref 0–1)
KETONES UA: NEGATIVE
LEUKOCYTE ESTERASE UA: NEGATIVE
NITRITE UA: NEGATIVE
PH UA: 5 (ref 5.0–9.0)
PROTEIN UA: NEGATIVE
RBC UA: <1 /HPF (ref <=4)
SPECIFIC GRAVITY UA: 1.014 (ref 1.003–1.030)
SQUAMOUS EPITHELIAL: <1 /HPF (ref 0–5)
UROBILINOGEN UA: <2
WBC UA: <1 /HPF (ref 0–5)

## 2023-08-27 LAB — HIGH SENSITIVITY TROPONIN I - 6 HOUR SERIAL
HIGH SENSITIVITY TROPONIN - DELTA (2-6H): 0 ng/L (ref <=7)
HIGH-SENSITIVITY TROPONIN I - 6 HOUR: 10 ng/L (ref <=34)

## 2023-08-27 LAB — PRO-BNP: PRO-BNP: 4261 pg/mL — ABNORMAL HIGH (ref <=300.0)

## 2023-08-27 LAB — FACTOR VIII (VWF PANEL): FACTOR VIII ACTIVITY: 186.8 % — ABNORMAL HIGH (ref 56.0–186.0)

## 2023-08-27 LAB — D-DIMER, QUANTITATIVE: D-DIMER QUANTITATIVE (CW,ML,HL,HS,CH,JS,JC,RX,RH): 595 ng{FEU}/mL — ABNORMAL HIGH (ref <=500)

## 2023-08-27 LAB — PROTIME-INR
INR: 1.03
PROTIME: 11.7 s (ref 9.9–12.6)

## 2023-08-27 MED ADMIN — lactated ringers bolus 1,000 mL: 1000 mL | INTRAVENOUS | @ 02:00:00 | Stop: 2023-08-26

## 2023-08-27 MED ADMIN — tacrolimus (PROGRAF) capsule 0.5 mg: .5 mg | ORAL | @ 17:00:00 | Stop: 2023-08-27

## 2023-08-27 MED ADMIN — mycophenolate (MYFORTIC) EC tablet 360 mg: 360 mg | ORAL | @ 17:00:00

## 2023-08-27 MED ADMIN — carvedilol (COREG) tablet 25 mg: 25 mg | ORAL | @ 10:00:00 | Stop: 2023-08-27

## 2023-08-27 MED ADMIN — amlodipine (NORVASC) tablet 5 mg: 5 mg | ORAL | @ 17:00:00

## 2023-08-27 MED ADMIN — hydroxyurea (HYDREA) capsule 1,500 mg: 1500 mg | ORAL | @ 21:00:00 | Stop: 2023-08-27

## 2023-08-27 NOTE — Unmapped (Addendum)
 Outpatient follow up:  [ ]  Plan for outpatient VQ scan - Transplant Nephrology team to arrange  [ ]  Recommend renal function panel and CBC this week   [ ]  Holding metformin, Jardiance, and keeping losartan at 1/2 dose (25 mg) due to renal function - follow up to assess restarting  [ ]  Stopped pacritinib and transitioned back to hydroxyurea - will follow up with Hematology    ---------------------    Erin Welch is a 73 y.o. female whose presentation is complicated by  essential thrombocythemia, s/p deceased donor kidney transplant 04/04/2013, HTN, COPD, pulmonary HTN, HFpEF, OSA, DMII that presented to [facility] with AKI, severe elvation of PLT (>1.5 million), and ongoing back pain and cough.     She was sent to ER by Transplant Nephrology team with concern for PE given cough and flank pain with increased pulmonary HTN by TTE indices, with D-dimer within age-adjusted limits and with no clots on bilateral LE PVLs. No change in baseline O2, and she felt close to baseline on evaluation by admitting Med B team. Deferred CTA chest given AKI and discussed inpatient VQ scan, but patient much preferred to go home with plan for close outpatient follow up. Communicated plan with Transplant Nephrology, and an outpatient VQ scan will be arranged.     Other notable hospital course as below:     Cough I Flank Pain   Has had 2 weeks of ongoing worsening cough associated several days of lower back pain. Pain in the back/flank area worsens with coughing and deep inspiration.  She has chronic SOB/DOE and is on 3 L O2 at baseline with no increase in dyspnea or oxygen requirement at home.  No chest pain or lower extremity edema. No hemoptysis.  Ultimately sent here by primary nephrologist to rule out PE.  In ED patient afebrile, hemodynamically stable, and saturating well on home 3 L nasal cannula.  High-sensitivity troponin negative x 2.  proBNP greater than 4000.  RPP negative.  D-dimer negative based on age adjustment.  CXR with pulmonary vascular congestion.  Bilateral lower extremity PVLs negative for DVT.  Given her severely elevated platelet count she is at high risk for thromboembolism, but work-up was overall reassuring. She felt well at time of discharge.   - Offered inpatient VQ scan, which patient declined - will obtain this outpatient with Transplant Nephrology team    AKI I Status post kidney transplant  I Immunocompromised Host   Follows with Dr. Margaretmary Bayley, [facility] transplant nephrology.  Baseline creatinine <1.7 mg/dL with creatinine up to 1.6-1.0.  Recommended admission by outpatient nephrologist for further workup.  Renal transplant ultrasound with decreased resistive indices compared to prior, decreased velocities in the main renal artery, and partially distended bladder.  UA noninfectious with no proteinuria.  She has had ongoing GI symptoms with loose stools.  Concern AKI may be secondary to prerenal etiology in the setting of GI losses and recent short-lives n/v at home.  Less likely to be secondary to tacrolimus nephrotoxicity given stable Tac levels. Less likely to be secondary to ATN or rejection. She has no prior history of rejection or history of DSAs (most recent screen 05/16/23).   - Transplant nephrology notified of admission, and Cr was downtrending to <2 by discharge  - Will discharge holding metformin and Jardiance, with 1/2 home dosing of losartan  - No changes made to immunosuppression.    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 Anise Salvo and Synetta Fail, AGNP. Recently came off of her Hydroxyurea to start pacritinib. Platelet count in clinic found to be 1,453,000. Evaluated by hematology in ED. Her elevated platelets put her at risk for both clots and bleeding (via vWF being low attaching to the PLTs), thus cytoreductive therapy is indicated to rapidly reduce the platelet count.  Stopped the pacritinib per Hematology recs and restarted hydroxyurea at 1500 mg and 1000 mg on alternating days.     Chronic Hypoxemic Respiratory Failure I Severe Pulmonary HTN I COPD  I HFpEF  Follows with Dr. Ala Dach at [facility]. Has known likely group 2 pulmonary hypertension with her left heart disease from diastolic dysfunction and group 3 PAH with her COPD and sleep disordered breathing.   Prior PFTs and imaging consistent with COPD.  No ILD noted on CT. Dyspnea stable on admission.  O2 requirement stable at 3 L. CXR does show signs of congestion and she has elevated pro-BNP. Will discharge on home Lasix and MRA.     Nausea I Vomiting I Diarrhea  1 day of N/V/D, now resolved. May be contributing to AKI. Patient attributed this to taking one dose of tramadol.      Chronic Problems     History of recurrent UTI.    She currently has no symptoms of UTI    - Continue Keflex ppx      Hypertension management I Adrenal Adenoma  Right adrenal adenoma 3.0 cm in size noted on chest CT 07/16/22 (was 2.7 cm 12/27/2014). Per nephrology will not plan to pursue intervention on adrenal adenoma given excellent BP control previously      DM, type 2  - Holding home Metformin   - Holding home Jardiance      Gout  - Home allopurinol continued

## 2023-08-27 NOTE — Unmapped (Signed)
 Nephrology ([facility]) History & Physical    Assessment & Plan:   Erin Welch is a 73 y.o. female whose presentation is complicated by  essential thrombocythemia, s/p deceased donor kidney transplant 04/04/2013, HTN, COPD, pulmonary HTN, HFpEF, OSA, DMII that presented to [facility] with AKI, severe elvation of PLT (>1.5 million), and ongoing back pain and cough.     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 Margaretmary Bayley, [facility] transplant nephrology.  Baseline creatinine <1.7 mg/dL with creatinine up to 9.6-2.9.  Recommended admission by outpatient nephrologist for further workup.  Renal transplant ultrasound with decreased resistive indices compared to prior, decreased velocities in the main renal artery, and partially distended bladder.  UA noninfectious with no proteinuria.  She has had ongoing GI symptoms with loose stools.  Concern AKI may be secondary to prerenal etiology in the setting of GI losses with her standing diuretic regimen at home.  Less likely to be secondary to tacrolimus nephrotoxicity given stable Tac levels.  Given urinary retention on renal ultrasound it is possible that this is partly due to transient obstructive uropathy.  Less likely to be secondary to ATN or rejection. She has no prior history of rejection or history of DSAs (most recent screen 05/16/23).   - Transplant nephrology aware of admission  - S/p 1 L IVF in ED - given CXR findings and known pulmonary HTN will hold on further IVF for now   - Hold losartan, Jardiance, eplerenone, furosemide for now   - Repeat bladder scan   - Immunosuppression:   - Continue home tacrolimus 0.5 mg BID (goal 4-7)   - Continue home MMF 360 mg twice daily   - Follow up CMV and EBV   - Daily renal function panel and mag  - Daily check  tacrolimus trough  - Strict I's and O's and daily weights  - Avoid nephrotoxic agents    Cough I Flank Pain   Has had 2 weeks of ongoing worsening cough associated several days of lower back pain. Pain in the back/flank area worsens with coughing and deep inspiration.  She has chronic SOB/DOE and is on 3 L O2 at baseline with no increase in dyspnea or oxygen requirement at home.  No chest pain or lower extremity edema. No hemoptysis.  Ultimately sent here by primary nephrologist to rule out PE.  In ED patient afebrile, hemodynamically stable, and saturating well on home 3 L nasal cannula.  High-sensitivity troponin negative x 2.  proBNP greater than 4000.  RPP negative.  D-dimer negative based on age adjustment.  CXR with pulmonary vascular congestion.  Bilateral lower extremity PVLs negative for DVT.  Given her severely elevated platelet count she is at high risk for thromboembolism however I have low suspicion for this given O2 requirement at baseline, no tachycardia (although on beta-blocker), negative D-dimer and negative  PVLs.  Higher suspicion that her cough may be related to by postviral illness and pain is MSK in nature.  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 Anise Salvo and Synetta Fail, AGNP. Recently came off of her Hydroxyurea to start pacritinib. Platelet count in clinic found to be 1,453,000. Evaluated by hematology in ED. Her elevated platelets put her at risk for both clots and bleeding (via vWF being low attaching to the PLTs), thus cytoreductive therapy is indicated to rapidly reduce the platelet count.    - Hematology following, appreciate recds  - Hydroxyurea 1500 mg then 1000 mg once a day (ie 1500 today, 1000 tomorrow, 1500 Monday and so-forth)  - Hold home pacritinib   - Continue DVT prophylaxis with subcutaneous heparin  -Follow up VWF/coags     Chronic Hypoxemic Respiratory Failure I Severe Pulmonary HTN I COPD  I HFpEF  Follows with Dr. Ala Dach at [facility]. Has known likely group 2 pulmonary hypertension with her left heart disease from diastolic dysfunction and group 3 PAH with her COPD and sleep disordered breathing.   Prior PFTs and imaging consistent with COPD.  No ILD noted on CT. Dyspnea stable on admission.  O2 requirement stable at 3 L. CXR does show signs of congestion and she has elevated pro-BNP.   - Continue home CPAP   - Continue home Incruse daily   - Holding home Jardiance   - Holding diuretic (lasix 40 mg daily) as above in setting of AKI but likely need to resume soon    - Strict I&O and daily weights    Nausea I Vomiting I Diarrhea  1 day of N/V/D now resolved. May be contributing to AKI.   - Cancelled GIPP and C.diff ordered in ED given improvement in sx   - Follow up CMV and EBV viral load    Chronic Problems    History of recurrent UTI.    She currently has no symptoms of UTI    - Continue Keflex ppx     Hypertension management I Adrenal Adenoma  Right adrenal adenoma 3.0 cm in size noted on chest CT 07/16/22 (was 2.7 cm 12/27/2014). Per nephrology will not plan to pursue intervention on adrenal adenoma given excellent BP control previously   - Holding home eplerenone, losartan, furosemide  - Continue home amlodipine 5 mg daily  - Continue home carvedilol 25 mg BID    DM, type 2  - Continue home Metformin   - Holding home Jardiance     Gout  - Allopurinol 100 mg daily     The patient's presentation is complicated by the following clinically significant conditions requiring additional evaluation and treatment: - Chronic kidney disease POA requiring further investigation, treatment, or monitoring   - Age related debility POA requiring additional resources: DME, PT, or OT     Issues Impacting Complexity of Management:  -Intensive monitoring of drug toxicity from Tacrolimus with scheduled levels and Mycophenolate sodium (Myfortic) with scheduled levels      Medical Decision Making: Discussed the patient's management and/or test interpretation with ED Provider as summarized within  this note      Checklist:  Diet: Regular Diet  DVT PPx: Heparin 5000units q8h  Code Status: DNR and DNI  Dispo: Patient appropriate for Observation based on expectation of ongoing need for hospitalization less than two midnights and/or low intensity of services provided    Team Contact Information:   Primary Team: Nephrology ([facility])  Primary Resident: Linna Hoff, MD  Resident's Pager: [phone] (Nephrology Senior Resident)    Chief Concern:   AKI (acute kidney injury) (CMS-HCC)      Subjective:   Erin Welch is a 73 y.o. 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       History obtained by patient and her husband.       HPI:    Mrs. Terrance states that over the past 2 weeks she has had a harsh non productive cough. She says she has a chronic cough at baseline however had acute worsening about 2 weeks ago. About 2-3 days ago she developed lower bilateral back pain which seemed to worsen with coughing or taking a deep breath in. Other than her cough she denied any other respiratory symptoms including rhinorrhea, headache, fevers, chills, sinus congestion, or sore throat. She denies any hemoptysis. She says that she has chronic shortness of breath but this is no worse than normal and she has not had to adjust her oxygen at home, typically on 3 L [place]. She denies any worsening LE swelling or pain. No known sick contacts. No changes in urination.  No abdominal pain or pain around her transplant kidney.    Her husband notes that several weeks ago they reduced her dosage of hydroxyurea with plans to start pacritinib.  Ultimately pacritinib was started and her hydroxyurea was stopped.  Since then her platelet counts have been significantly elevated.    Patient was seen by her transplant nephrologist on 2/14.  Given her symptoms he sent her to the ED for PE to be ruled out.      Pertinent Surgical Hx  Past Surgical History:   Procedure Laterality Date    HAND SURGERY      PR CYSTOSCOPY,REMV CALCULUS,SIMPLE  05/15/2013    Procedure: CYSTOURETHROSCOPY, WITH REMOVAL OF FOREIGN BODY, CALCULUS OR URETERAL STENT FROM URETHRA OR BLADDER; SIMPLE;  Surgeon: Randalyn Rhea, MD;  Location: MAIN OR [facility];  Service: Urology    PR REMOVAL TUNNELED INTRAPERITONEAL CATHETER N/A 05/15/2013    Procedure: REMOVAL OF PERMANENT INTRAPERITONEAL CANNULA OR CATHETER;  Surgeon: Purcell Mouton, MD;  Location: MAIN OR [facility];  Service: Transplant    PR RIGHT HEART CATH O2 SATURATION & CARDIAC OUTPUT N/A 03/24/2015    Procedure: Right Heart Catheterization;  Surgeon: Mittie Bodo, MD;  Location: [facility] CATH;  Service: Cardiology    PR TRANSPLANT,PREP CADAVER RENAL GRAFT N/A 04/04/2013    Procedure: [facility] STD PREP CAD DONR RENAL ALLOGFT PRIOR TO TRNSPLNT, INCL DISSEC/REM PERINEPH FAT, DIAPH/RTPER ATTAC;  Surgeon: Vivi Barrack, MD;  Location: MAIN OR [place];  Service: Transplant    PR TRANSPLANTATION OF KIDNEY N/A 04/04/2013    Procedure: RENAL ALLOTRANSPLANTATION, IMPLANTATION OF GRAFT; WITHOUT RECIPIENT NEPHRECTOMY;  Surgeon: Vivi Barrack, MD;  Location: MAIN OR ;  Service: Transplant    SKIN BIOPSY      knee and back          Pertinent Family Hx  Family History   Problem Relation  Age of Onset    Diabetes Mother     Dementia Mother 73    Coronary artery disease Father     Kidney failure Father     Kidney disease Father     Melanoma Neg Hx     Basal cell carcinoma Neg Hx     Squamous cell carcinoma Neg Hx     Stroke Neg Hx     Breast cancer Neg Hx     Colon cancer Neg Hx     Endometrial cancer Neg Hx     Ovarian cancer Neg Hx          Pertinent Social Hx   Patient lives at home with her husband.  She is independent of her ADLs and IADLs.  She denies alcohol tobacco or illicit drug use.      Allergies  Macrobid [nitrofurantoin monohyd/m-cryst] and Nitrofurantoin    I reviewed the Medication List. The current list is Accurate  Prior to Admission medications    Medication Dose, Route, Frequency   acetaminophen (TYLENOL 8 HOUR) 650 MG CR tablet 650 mg, Oral, Nightly   acetaminophen (TYLENOL) 325 MG tablet 650 mg, Every 4 hours PRN   albuterol HFA 90 mcg/actuation inhaler 2 puffs, Inhalation, Every 6 hours PRN   allopurinol (ZYLOPRIM) 100 MG tablet TAKE 2 TABLETS BY MOUTH IN THE MORNING   amlodipine (NORVASC) 5 MG tablet 5 mg, Oral, Daily (standard)   aspirin (ECOTRIN) 81 MG tablet 81 mg, Daily (standard)   carvedilol (COREG) 25 MG tablet 25 mg, Oral   cephalexin (KEFLEX) 250 MG capsule 250 mg, Oral, Daily (standard)   cholecalciferol, vitamin D3 25 mcg, 1,000 units,, 1,000 unit (25 mcg) tablet 25 mcg, Oral, Daily (standard)   colchicine (COLCRYS) 0.6 mg tablet 0.6 mg, Oral, 2 times a day PRN, For acute gout, take 2 tablets by mouth, then 1 tablet one hour later on first day. May continue 1 tablet up to twice a day as needed.   empagliflozin (JARDIANCE) 10 mg tablet 10 mg, Oral, Daily (standard)   eplerenone (INSPRA) 25 MG tablet 50 mg, Daily (standard)   epoetin alfa (EPOGEN,PROCRIT) 40,000 unit/mL injection 40,000 Units, Subcutaneous, Weekly   fluticasone propionate (FLONASE) 50 mcg/actuation nasal spray Use 1 spray(s) in each nostril twice daily   furosemide (LASIX) 40 MG tablet 40 mg, Oral, 2 times a day (standard)   glucosamine-chondroitin 500-400 mg tablet 1 tablet, 2 times a day   hydroxyurea (HYDREA) 500 mg capsule 1,000 mg, Oral, Daily (standard)   losartan (COZAAR) 50 MG tablet 50 mg, Oral, Daily (standard)   MEDICAL SUPPLY ITEM 14 cm, Nightly   melatonin 10 mg Tab 10 mg, Oral, Nightly   metFORMIN (GLUCOPHAGE) 500 MG tablet 500 mg, Oral, 2 times a day with meals   MYFORTIC 180 mg EC tablet 360 mg, Oral, 2 times a day (standard)   OMEGA-3/DHA/EPA/FISH OIL (FISH OIL-OMEGA-3 FATTY ACIDS) 300-1,000 mg capsule 1 g, Oral, Daily (standard)   pacritinib (VONJO) 100 mg capsule 100 mg, Oral, 2 times a day (standard), Swallow capsules whole. Do not open, break, or chew capsules.   pantoprazole (PROTONIX) 40 MG tablet 40 mg, Oral, Daily (standard)   pravastatin (PRAVACHOL) 40 MG tablet 40 mg, Oral, Daily (standard)   PROGRAF 0.5 mg capsule 0.5 mg, Oral, 2 times a day   sodium bicarbonate 650 mg tablet 1,300 mg, Oral, 2 times a day (standard), NEW DOSE   torsemide (DEMADEX) 20 MG tablet 60 mg, Oral, Daily (standard), Do not  take furosemide if you are taking torsemide.  Patient not taking: Reported on 08/12/2023   umeclidinium (INCRUSE ELLIPTA) 62.5 mcg/actuation inhaler 1 puff, Inhalation   zolpidem (AMBIEN) 5 MG tablet 5 mg, Oral, Nightly PRN       Designated Healthcare Decision Maker:  Ms. Keenum currently has decisional capacity for healthcare decision-making and is able to designate a surrogate healthcare decision maker. Ms. Nicholls designated healthcare decision maker(s) is/are Raylynne Cubbage  (the patient's spouse) as denoted by stated patient preference.    Objective:   Physical Exam:  Temp:  [36.7 ??C (98 ??F)-36.9 ??C (98.5 ??F)] 36.7 ??C (98 ??F)  Heart Rate:  [63-68] 68  SpO2 Pulse:  [64-70] 69  Resp:  [16-22] 16  BP: (132-151)/(54-106) 136/54  SpO2:  [91 %-98 %] 93 %    Gen: Female sitting up comfortably in bedside chair in NAD, converses   Eyes: Sclera anicteric, EOMI grossly normal HENT: Atraumatic, normocephalic  Neck: Trachea midline  Heart: RRR, systolic murmur appreciated  Lungs: Fine crackles appreciated throughout, breathing comfortably on 3 L nasal cannula  Abdomen: Soft, NTND, no pain over transplant site.  Extremities: Trace pitting edema bilateral lower extremities, no calf tenderness, no erythema or warmth of the calf  Neuro: Grossly symmetric, non-focal    Skin:  No rashes, lesions on clothed exam  Psych: Alert, oriented    Carolanne Grumbling, MD, [facility]  [facility] Internal Medicine PGY-3  Pager: [phone]

## 2023-08-27 NOTE — Unmapped (Signed)
 Tacrolimus Therapeutic Monitoring Pharmacy Note    Erin Welch is a 73 y.o. female continuing tacrolimus.     Indication: Kidney transplant     Date of Transplant:  04/04/2013       Prior Dosing Information: Home regimen was 1 mg PO BID which was reduced in clinic 2/14 to 0.5 mg PO BID      Source(s) of information used to determine prior to admission dosing: Clinic Note    Goals:  Therapeutic Drug Levels  Tacrolimus trough goal: 4-7 ng/mL    Additional Clinical Monitoring/Outcomes  Monitor renal function (SCr and urine output) and liver function (LFTs)  Monitor for signs/symptoms of adverse events (e.g., hyperglycemia, hyperkalemia, hypomagnesemia, hypertension, headache, tremor)    Results:   Tacrolimus levels:  - 2/14 @ 1217 (14 hour post dose level per team): 4.1 ng/mL  - 2/14 @ 2104 (unclear if dose taken at noon on 2/14 after clinic and prior to hospital arrival): 7.0 ng/mL  - 2/15 @ 1002 (~10 hour level. Patient reports taking home supply of Tacrolimus in ED around midnight. Dose per RN was 0.5 mg. No documentation on MAR): 4.6 ng/mL    Pharmacokinetic Considerations and Significant Drug Interactions:  Concurrent CYP3A4 substrates/inhibitors: None identified    Assessment/Plan:  Recommendedation(s)  Continue reduced dose of Tacrolimus 0.5 mg PO BID. Given level result this AM, it is possible patient can resume previous home regimen of 1 mg PO BID but additional levels would be helpful in determining the timing of when this could occur.  Would dicsuss with patient need to adjust Tacrolimus administration times inpatient to 0800 and 2000, otherwise levels will be difficult to obtain same day given lab cut-off of noon.    Follow-up  Tacrolimus level ordered for 2/16 AM .   A pharmacist will continue to monitor and recommend levels as appropriate    Please page service pharmacist with questions/clarifications.    Christene Lye, PharmD

## 2023-08-27 NOTE — Unmapped (Signed)
 I received signout from previous ED physician.     ED I-PASS Handoff  Illness Severit: watcher  Patient Summary: Erin Welch is a 73 y.o. female history of essential thrombocytosis, renal transplant who was sent in by transplant surgery with concerns for her thrombocytosis.  She is on immunosuppressant therapy in the setting of her renal transplant.  She presented the emergency department with cough, nausea, vomiting, diarrhea, acute kidney injury.  Has also had 2 weeks of pleuritic chest pain.  Given her thrombocytosis there was concern for this patient having potential pulmonary embolism.  Workup revealed fluid to 1500 which is consistent with her baseline, and acute kidney injury with a creatinine of 2.29 up from 1.57.  Reassuring troponin, reassuring phosphorus, RPP negative.  Bilateral lower extremity Doppler without signs of DVT.  Prevoid volume 320 mL.  Action List: Plan for VQ scan this morning.  Follow-up on bladder scan for postvoid volume.  Consult hematology.   Situation Awareness (Contingency Planning): Anticipate continued care  Synthesis by Receiver    ED Course as of 08/27/23 2138   Sat Aug 27, 2023   0930 Bladder ultrasound at ~830 after last void at [facility] was 194 mL. Improved from ~330 previously. Will order a repeat PVR for 10am to determine need for foley catheter - per nephrology recs for retaining.    5409 Hematology paged to discuss case:  Hi, reaching out re: Erin Welch MRN [id number] in the ED, hx renal transplant, essential thrombocytosis. Platelets >1500K.  Thanks, Tawanna Solo, [phone]   1024 Spoke with hematology fellow who will be coming down to evaluate the patient.  They feel that if the patient were to be continued on their ET pacritinib medication they would need to obtain it from home.  They will be going to evaluate the patient and will advise the team.   1356 PVR was 8ml   1456 Dr. Everardo All from hematology recommended alternating hydroxyurea doses beginning today with 1500 mg and then having tomorrow be 1000 mg in an alternating fashion going forward.  Additionally recommended initiating prophylactic subcutaneous heparin.  These orders were placed.  Based on our discussion and after the D-dimer resulting within normal limits for the age-adjusted value and given the patient's clinical presentation, do not feel that there is a pulmonary embolism at this time.  Based on this and following discussion with attending physicians do not feel that there is a need to call in the radiology team for this VQ scan at this time.  Will continue to monitor.  We will work with MAO team to attempt to expedite admission for this patient   1900 Patient signed out to incoming team in stable condition.  Pending admission with understanding that this is the next patient to be admitted per discussion with MAO.       Discussion of Management with other Physicians, QHP, or Appropriate Source:   External Records Reviewed: I have independently reviewed patient's external medical records through Care Everywhere:   Escalation of Care, Consideration of Admission/Observation/Transfer:   Social determinants that significantly affected care:   Prescription drug(s) considered but not prescribed:   Diagnostic tests considered but not performed:     Final diagnoses:   Acute cough (Primary)   Flank pain   Nausea and vomiting, unspecified vomiting type   Diarrhea, unspecified type   AKI (acute kidney injury) (CMS-HCC)   Renal transplant recipient       Vitals:    08/27/23 1045 08/27/23 1100 08/27/23  1115 08/27/23 2130   BP:  143/57  136/54   Pulse:    68   Resp:       Temp:  36.7 ??C (98 ??F)     TempSrc:       SpO2: 95% 94% 93%    Weight:           US Venous Doppler Lower Extremity Bilateral   Final Result   No deep vein thrombosis in the bilateral lower extremities.                     US Renal Transplant W Doppler   Final Result   --Decreased resistive indices in the renal transplant arteries compared to 02/28/2023, now only mildly elevated.   --Decreased velocities in the main renal artery, now measuring up to 0.9 m/s at the anastomosis and previously 4.4 m/s.   --Redemonstrated patchy power Doppler perfusion in the upper pole of the transplant kidney.   --Partially distended bladder with calculated volume of 322.8 mL. Patient unable to void per sonographer report.            Please see below for data measurements:      Transplant location: LLQ       Renal Transplant: Sagittal 11.2 cm; AP 5.5 cm; Transverse 4.4 cm      Segmental artery superior resistive index: 0.70   Segmental artery mid resistive index: 0.87   Segmental artery inferior resistive index: 0.80      Previous resistive indices range of segmental arteries: 1      Main renal artery peak systolic velocity at anastomosis: 0.90 m/s   Main renal artery hilum resistive index: 0.87   Main renal artery mid resistive index: 0.85   Main renal artery anastomosis resistive index: 0.82      Previous resistive indices range of main renal artery: 1      Main renal vein: patent      Iliac artery: Patent   Iliac vein: Patent      Bladder volume prevoid: 322.8  mL   Bladder volume postvoid: mL               XR Chest 2 views   Final Result      Pulmonary vascular congestion.   Cardiomegaly.      NM Lung Ventilation Perfusion    (Results Pending)       Lab Results   Component Value Date    WBC 10.2 08/26/2023    HGB 11.1 (L) 08/26/2023    HCT 32.3 (L) 08/26/2023    PLT 1,542 (HH) 08/26/2023       Lab Results   Component Value Date    NA 141 08/27/2023    K 3.9 08/27/2023    CL 101 08/27/2023    CO2 26.0 08/27/2023    BUN 69 (H) 08/27/2023    CREATININE 2.21 (H) 08/27/2023    GLU 205 (H) 08/27/2023    CALCIUM 9.9 08/27/2023    MG 2.1 08/26/2023    PHOS 3.8 08/26/2023       Lab Results   Component Value Date    BILITOT 0.3 08/26/2023    BILIDIR 0.20 03/02/2023    PROT 7.4 08/26/2023    ALBUMIN 3.8 08/26/2023    ALT 14 08/26/2023    AST 14 08/26/2023    ALKPHOS 72 08/26/2023    GGT 59 (H) 03/08/2023       Lab Results   Component Value Date  LABPROT 11.0 04/17/2013    INR 1.03 08/27/2023    APTT 29.0 08/27/2023

## 2023-08-27 NOTE — Unmapped (Addendum)
 Transplant Nephrology Consult     Requesting Attending Physician :  Mauri Pole, MD  Service Requesting Consult : Emergency Medicine  Reason for Consult: AKI    Assessment and Plan:     # S/p Kidney Transplant, Kidney allograft function:  - baseline Serum creatinine level is 1.3-1.6  - KDPI 59%, CPRA 87%?  - native kidney disease unknown   - deceased donor kidney transplant on 04/04/2013   - CMV High Risk: D+/R-. EBV: D+/R+. Implantation biopsy with mild to moderate arteriolar hyalinosis.   - no prior DSA (05/16/2023)   - presenting with AKI likely in the setting of poor renal perfusion with ongoing GI symptoms and diuretic use vs tac induced nephrotoxicity along with recent start of pacritinib  - hold losartan, Jardiance, eplerenone, furosemide for now   - renal US concerning for mildly elevated resistive indices, decreased velocities main renal artery at 0.9 m/s from 4.4 m/s , etiology: hypovolemia vs obstruction. Pt received fluids in ED 1L, CXR concerning for vl overload, Would not recc any changes right now. Plan to reassess renal function on AM labs no acute changes recc  - monitor BS (300 ml on renal US), place foley is pt is retaining     # Immunosuppression:  - tac, MMF 360 mg BID  - tac level 14, plan to decreased to 0.5 mg BID  - Please obtain trough tac trough levels prior to the morning dose of the medication. The tac goal level is 4-7 ng/mL.    # Blood Pressure / Volume:  - iso known adrenal adenoma  - Right adrenal adenoma 3.0 cm in size noted on chest CT 07/16/22 (was 2.7 cm 12/27/2014).  - as outline by outpt nephrologist hold eplerenone, losartan, furosemide  - may use  amlodipine 5 mg daily, carvedilol 25 mg BID  - Will not plan to pursue intervention on adrenal adenoma given excellent BP control previously     # Infectious Prophylaxis and Monitoring:   -  Influenza vaccine 04/14/23   - Shingrix x 2 completed (04/15/17, 10/11/17).   - COVID-19 04/14/23  - Pneumovax 23 07/09/20.  Prevnar 13 04/16/14  - RSV vaccine 02/25/22    # Nausea/Emesis  - for ongoing  diarrhea present would check GIPP, C.diff  - Check CMV and EBV viral load     #Essential thrombocythemia, CALR mutated.   - Bone marrow biopsy 05/09/23 with hypercellular marrow with features of ET with 3% blasts. Has known CALR mutation  - Platelet count 1,453,000   - pacritinib (a JAK2 inhibitor) from hydroxyurea on 08/15/23   - may also be 2/2 to diarrhea and emesis  - arterial clots ore likely in this setting but doppler US negative for clot burden   - bleeding risk is elevated    - Evaluation and management per primary team  - No changes to management from a nephrology standpoint at this time    RECOMMENDATIONS:   - hold losartan, Jardiance, eplerenone, furosemide for now   - may use amlodipine 5 mg daily, carvedilol 25 mg BID  - Will not plan to pursue intervention on adrenal adenoma given excellent BP control previously   - for ongoing  diarrhea present would check GIPP, C.diff  - Check CMV and EBV viral load   - monitor BS (300 ml on renal US), place foley is pt is retaining   - Cont tac at 0.5 mg BID, mmf 360 mg BID, tac trough  - Plan to reassess renal  function on AM labs no acute changes recc  - Transplant patients with an open wound require wound care with sterile water only. The patient should be counseled on this at the time of discharge if they have not already been doing this.  - We will continue to follow.     Ambrose Mantle, MD  08/27/2023 2:48 AM     Medical decision-making for 08/27/23  Findings / Data     Patient has: []  acute illness w/systemic sxs  [mod]  []  two or more stable chronic illnesses [mod]  []  one chronic illness with acute exacerbation [mod]  []  acute complicated illness  [mod]  []  Undiagnosed new problem with uncertain prognosis  [mod] [x]  illness posing risk to life or bodily function (ex. AKI)  [high]  []  chronic illness with severe exacerbation/progression  [high]  []  chronic illness with severe side effects of treatment  [high] AKI Probs At least 2:  Probs, Data, Risk   I reviewed: [x]  primary team note  [x]  consultant note(s)  []  external records [x]  chemistry results  [x]  CBC results  []  blood gas results  []  Other []  procedure/op note(s)   []  radiology report(s)  []  micro result(s)  []  w/ independent historian(s) Cr 2.2 >=3 Data Review (2 of 3)    I independently interpreted: []  Urine Sediment  []  Renal US []  CXR Images  []  CT Images  []  Other []  EKG Tracing  Any     I discussed: []  Pathology results w/ QHPs(s) from other specialties  []  Procedural findings w/ QHPs(s) from other specialties []  Imaging w/ QHP(s) from other specialties  [x]  Treatment plan w/ QHP(s) from other specialties Plan discussed with primary team Any     Mgm't requires: []  Prescription drug(s)  [mod]  []  Kidney biopsy  [mod]  []  Central line placement  [mod] [x]  High risk medication use and/or intensive toxicity monitoring [high]  []  Renal replacement therapy [high]  []  High risk kidney biopsy  [high]  []  Escalation of care  [high]  []  High risk central line placement  [high] Immunosuppression: high risk for infection Risk      _____________________________________________________________________________________    Transplant Background  Transplant Date: 04/04/2013 (Kidney)  Native Kidney Disease:Hypertensive Nephrosclerosis,  ; Biopsy Proven:no  1. ESRD of unknown etiology.   2. Deceased donor kidney transplant 04/03/2013. CMV High Risk: D+/R-. EBV: D+/R+. Implantation biopsy with mild to moderate arteriolar hyalinosis.  3. Thymooglobulin induction  4. Delayed graft function  5. Maintenance immunosuppression tacrolimus, mycophenolate, prednisone.  6. 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 Recent screens have been negative as of 09/21/16.  7. Baseline creatinine 1.3-1.6 mg/dL.    History of Present Illness:  Erin Welch is a/an 73 y.o. 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 Nephrology has been consulted for AKI.     Pt sent in from outpt nephrology office due to progression of chronic cough accompanied by b/l flank pain. Labs were concerning for cr 2.2, PLT 1.4 million in the setting of recent medication switch pacritinib (a JAK2 inhibitor) from hydroxyurea on 08/15/23 Of note pt also experiencing GI symptoms and some emesis at home,  UA unremarkable  UPCR/UACR unremarkable    IN ed received 1L of fluids    INPATIENT MEDICATIONS:  No current facility-administered  medications for this encounter.  OUTPATIENT MEDICATIONS:  Prior to Admission medications    Medication Dose, Route, Frequency   acetaminophen (TYLENOL) 325 MG tablet 650 mg, Every 4 hours PRN   albuterol HFA 90 mcg/actuation inhaler 2 puffs, Inhalation, Every 6 hours PRN   allopurinol (ZYLOPRIM) 100 MG tablet TAKE 2 TABLETS BY MOUTH IN THE MORNING   amlodipine (NORVASC) 5 MG tablet 5 mg, Oral, Daily (standard)   aspirin (ECOTRIN) 81 MG tablet 81 mg, Daily (standard)   carvedilol (COREG) 25 MG tablet 25 mg, Oral   cephalexin (KEFLEX) 250 MG capsule 250 mg, Oral, Daily (standard)   cholecalciferol, vitamin D3 25 mcg, 1,000 units,, 1,000 unit (25 mcg) tablet 25 mcg, Oral, Daily (standard)   colchicine (COLCRYS) 0.6 mg tablet 0.6 mg, Oral, 2 times a day PRN, For acute gout, take 2 tablets by mouth, then 1 tablet one hour later on first day. May continue 1 tablet up to twice a day as needed.   empagliflozin (JARDIANCE) 10 mg tablet 10 mg, Oral, Daily (standard)   eplerenone (INSPRA) 25 MG tablet 50 mg, Daily (standard)   epoetin alfa (EPOGEN,PROCRIT) 40,000 unit/mL injection 40,000 Units, Subcutaneous, Weekly   fluticasone propionate (FLONASE) 50 mcg/actuation nasal spray Use 1 spray(s) in each nostril twice daily   furosemide (LASIX) 40 MG tablet 40 mg, Oral, 2 times a day (standard)   glucosamine-chondroitin 500-400 mg tablet 1 tablet, 2 times a day   hydroxyurea (HYDREA) 500 mg capsule 1,000 mg, Oral, Daily (standard)   losartan (COZAAR) 50 MG tablet 50 mg, Oral, Daily (standard)   MEDICAL SUPPLY ITEM 14 cm, Nightly   metFORMIN (GLUCOPHAGE) 500 MG tablet 500 mg, Oral, 2 times a day with meals   MYFORTIC 180 mg EC tablet 360 mg, Oral, 2 times a day (standard)   OMEGA-3/DHA/EPA/FISH OIL (FISH OIL-OMEGA-3 FATTY ACIDS) 300-1,000 mg capsule 1 g   pacritinib (VONJO) 100 mg capsule 100 mg, Oral, 2 times a day (standard), Swallow capsules whole. Do not open, break, or chew capsules.   pantoprazole (PROTONIX) 40 MG tablet 40 mg, Oral, Daily (standard)   pravastatin (PRAVACHOL) 40 MG tablet 40 mg, Oral, Daily (standard)   PROGRAF 0.5 mg capsule 0.5 mg, Oral, 2 times a day   sodium bicarbonate 650 mg tablet 1,300 mg, Oral, 2 times a day (standard), NEW DOSE   torsemide (DEMADEX) 20 MG tablet 60 mg, Oral, Daily (standard), Do not take furosemide if you are taking torsemide.  Patient not taking: Reported on 08/12/2023   umeclidinium (INCRUSE ELLIPTA) 62.5 mcg/actuation inhaler 1 puff, Inhalation   zolpidem (AMBIEN) 5 MG tablet 5 mg, Oral, Nightly PRN      ALLERGIES:  Macrobid [nitrofurantoin monohyd/m-cryst] and Nitrofurantoin  MEDICAL HISTORY:  Past Medical History:   Diagnosis Date    Actinic keratosis     Acute injury of anterior cruciate ligament     Allergic Myfodic    Anemia, iron deficiency     AVM (arteriovenous malformation)     small bowel diagnosed by capsule camer    Basal cell carcinoma 2018    Cataract     Chronic kidney disease     Clotting disorder (CMS-HCC)     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    Diabetes 2 09/02/2022    Diastolic CHF (CMS-HCC)     RHC 10/2012: PCWP 25 MPAP 40 PVR 2.4    Dyspnea on exertion 02/07/2015    Essential thrombocytosis (CMS-HCC) 2005    Heart disease 07/12/2014  History of renal transplant 03/2013    complicated by delayed onset graft function    Hypertension     Inverted nipple 01/19/2021    bilateral and pt states always have been inverted    Joint pain     Obstructive sleep apnea     on home CPAP therapy    Osteoarthritis     Pulmonary edema 01/07/2013    Pulmonary hypertension (CMS-HCC)     Squamous cell skin cancer      Past Surgical History:   Procedure Laterality Date    HAND SURGERY      PR CYSTOSCOPY,REMV CALCULUS,SIMPLE  05/15/2013    Procedure: CYSTOURETHROSCOPY, WITH REMOVAL OF FOREIGN BODY, CALCULUS OR URETERAL STENT FROM URETHRA OR BLADDER; SIMPLE;  Surgeon: Randalyn Rhea, MD;  Location: MAIN OR [place];  Service: Urology    PR REMOVAL TUNNELED INTRAPERITONEAL CATHETER N/A 05/15/2013    Procedure: REMOVAL OF PERMANENT INTRAPERITONEAL CANNULA OR CATHETER;  Surgeon: Purcell Mouton, MD;  Location: MAIN OR [facility];  Service: Transplant    PR RIGHT HEART CATH O2 SATURATION & CARDIAC OUTPUT N/A 03/24/2015    Procedure: Right Heart Catheterization;  Surgeon: Mittie Bodo, MD;  Location: [facility] CATH;  Service: Cardiology    PR TRANSPLANT,PREP CADAVER RENAL GRAFT N/A 04/04/2013    Procedure: [facility] STD PREP CAD DONR RENAL ALLOGFT PRIOR TO TRNSPLNT, INCL DISSEC/REM PERINEPH FAT, DIAPH/RTPER ATTAC;  Surgeon: Vivi Barrack, MD;  Location: MAIN OR [place];  Service: Transplant    PR TRANSPLANTATION OF KIDNEY N/A 04/04/2013    Procedure: RENAL ALLOTRANSPLANTATION, IMPLANTATION OF GRAFT; WITHOUT RECIPIENT NEPHRECTOMY;  Surgeon: Vivi Barrack, MD;  Location: MAIN OR [place];  Service: Transplant    SKIN BIOPSY      knee and back      SOCIAL HISTORY  Social History     Social History Narrative    Lives in one-level home in [place] with husband Marilu Favre) x 53 years.     Has 3 children (Buddy, Billy, Brad).     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     Current hobbies include: domestic travel, spending time with grandkids         ADLs:    Feeding: Independent    Dressing: Independent Ambulation: Independent    Toileting: Independent    Bathing: Independent        IADLs:    Using the telephone: Independent    Shopping: Independent    Meal preparation: Independent    Medication management: Independent    Managing finances: Independent    Housework: Dependent    Transportation (driving or navigating public transit): Independent         Assistive devices: electric W/C for longer distances;  Has quad cane          ----        Lives at home with husband. Independent, drives. Has 3 adult children. Former smoker. Rare alcohol use. No recreational drugs.       reports that she quit smoking about 19 years ago. Her smoking use included cigarettes. She started smoking about 39 years ago. She has a 20 pack-year smoking history. She has never used smokeless tobacco. She reports current alcohol use. She reports that she does not use drugs.   FAMILY HISTORY  Family History   Problem Relation Age of Onset    Diabetes Mother     Dementia Mother 69    Coronary artery disease Father  Kidney failure Father     Kidney disease Father     Melanoma Neg Hx     Basal cell carcinoma Neg Hx     Squamous cell carcinoma Neg Hx     Stroke Neg Hx     Breast cancer Neg Hx     Colon cancer Neg Hx     Endometrial cancer Neg Hx     Ovarian cancer Neg Hx         Physical Exam:   Vitals:    08/26/23 1630 08/26/23 2025 08/26/23 2215 08/27/23 0200   BP: 142/59 132/56  142/106   Pulse: 62 63 66 67   Resp: 18 20 16 22    Temp: 36.4 ??C (97.5 ??F)      TempSrc: Oral      SpO2: 97% 96% 92% 95%   Weight: 88 kg (194 lb)        No intake/output data recorded.  No intake or output data in the 24 hours ending 08/27/23 0248    Formal consult in AM

## 2023-08-27 NOTE — Unmapped (Signed)
 [facility] Consult Note    Requesting Attending Physician :  Duwaine Maxin, MD  Service Requesting Consult : Emergency Medicine  Reason for Consult: ET  Primary Hematologist: Dr Anise Salvo    Assessment: Erin Welch is a 73 y.o. woman with CALR-mutated ET diagnosed 05/2003, progressing toward post-ET myelofibrosis, s/p renal transplant (04/04/2013) maintained on tacrolimus, stage 1 lung cancer s/p XRT 06/19/2021, DM2, COPD and pulmonary hypertension  who was admitted for PE ruleout.     Hematology was consulted for evaluation of ET management.     CALR essential thrombocytosis   Chronic macrocytic anemia in setting of hydrea, stable  Concern for pulmonary embolism  The d-dimer of 595 and negative PVLs encouraging that there is no acute pulmonary embolism; obtaining the V/Q can would be helpful as well. If clinically she changes (ie oxygenation worsens can empirically treat with heparin gtt). Regarding her elevated platelets this putts her at risk for both clots and bleeding (via vWF being low attaching to the PLTs), thus cytoreductive therapy is indicated to rapidly reduce the platelet count. Hydroxyurea is the first-line cytoreductive agent and should be given to her in alternate dosing as described below. Hold pacritinib for now. While her hemoglobin is stable now, expect to see some fall as we are starting hydrea.       Recommendations:   -Please start alternate dosing of hydroxyurea 1500 mg then 1000 mg once a day (ie 1500 today, 1000 tomorrow, 1500 Monday and so-forth)  -Please start DVT prophylaxis with subcutaneous heparin  -V/Q scan   -VWF/coags   -Hold pacritinib    Case discussed with Dr. Harlow Ohms     Please contact the hematology fellow at [phone] with any further questions.      Elveria Rising, MD  Hematology and Oncology Fellow       -------------------------------------------------------------    HPI: Erin Welch is a 73 y.o. woman with CALR-mutated ET diagnosed 05/2003, progressing toward post-ET myelofibrosis, s/p renal transplant (04/04/2013) maintained on tacrolimus, stage 1 lung cancer s/p XRT 06/19/2021, DM2, COPD and pulmonary hypertension   and who is being seen at the request of Duwaine Maxin, MD for evaluation of ET.     Presented to her renal appointment where her back pain, persistent cough recommended ED visit to evaluate for PE. She reports that she's had a cough for 1 month. On home oxygen at baseline. Rare nose bleeds but hasn't had one for years, denies blood in gums/urine/stool/nose. She does not have any headache, vision changes or chest pain.  On Monday it will be 1 week since she's been on the pacritinib.     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    Diabetes 2 09/02/2022    Diastolic CHF (CMS-HCC)     RHC 10/2012: PCWP 25 MPAP 40 PVR 2.4    Dyspnea on exertion 02/07/2015    Essential thrombocytosis (CMS-HCC) 2005    Heart disease 07/12/2014    History  of renal transplant 03/2013    complicated by delayed onset graft function    Hypertension     Inverted nipple 01/19/2021    bilateral and pt states always have been inverted    Joint pain     Obstructive sleep apnea     on home CPAP therapy    Osteoarthritis     Pulmonary edema 01/07/2013    Pulmonary hypertension (CMS-HCC)     Squamous cell skin cancer        Past Surgical History:   Procedure Laterality Date    HAND SURGERY      PR CYSTOSCOPY,REMV CALCULUS,SIMPLE  05/15/2013    Procedure: CYSTOURETHROSCOPY, WITH REMOVAL OF FOREIGN BODY, CALCULUS OR URETERAL STENT FROM URETHRA OR BLADDER; SIMPLE;  Surgeon: Randalyn Rhea, MD;  Location: MAIN OR [facility];  Service: Urology    PR REMOVAL TUNNELED INTRAPERITONEAL CATHETER N/A 05/15/2013    Procedure: REMOVAL OF PERMANENT INTRAPERITONEAL CANNULA OR CATHETER;  Surgeon: Purcell Mouton, MD;  Location: MAIN OR [facility];  Service: Transplant    PR RIGHT HEART CATH O2 SATURATION & CARDIAC OUTPUT N/A 03/24/2015    Procedure: Right Heart Catheterization;  Surgeon: Mittie Bodo, MD;  Location: [facility] CATH;  Service: Cardiology    PR TRANSPLANT,PREP CADAVER RENAL GRAFT N/A 04/04/2013    Procedure: [facility] STD PREP CAD DONR RENAL ALLOGFT PRIOR TO TRNSPLNT, INCL DISSEC/REM PERINEPH FAT, DIAPH/RTPER ATTAC;  Surgeon: Vivi Barrack, MD;  Location: MAIN OR [place];  Service: Transplant    PR TRANSPLANTATION OF KIDNEY N/A 04/04/2013    Procedure: RENAL ALLOTRANSPLANTATION, IMPLANTATION OF GRAFT; WITHOUT RECIPIENT NEPHRECTOMY;  Surgeon: Vivi Barrack, MD;  Location: MAIN OR [place];  Service: Transplant    SKIN BIOPSY      knee and back        Family History   Problem Relation Age of Onset    Diabetes Mother     Dementia Mother 75    Coronary artery disease Father     Kidney failure Father     Kidney disease Father     Melanoma Neg Hx     Basal cell carcinoma Neg Hx     Squamous cell carcinoma Neg Hx     Stroke Neg Hx     Breast cancer Neg Hx     Colon cancer Neg Hx     Endometrial cancer Neg Hx     Ovarian cancer Neg Hx        Social History     Socioeconomic History    Marital status: Married    Number of children: 3   Occupational History    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 Yes    Tanning  bed use? No    Are you easily burned? Yes    Excessive sun exposure? No    Blistering sunburns? No   Social History Narrative    Lives in one-level home in [place] with husband Marilu Favre) x 53 years.     Has 3 children (Buddy, Billy, Brad).     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     Current hobbies include: domestic travel, spending time with grandkids         ADLs:    Feeding: Independent    Dressing: Independent    Ambulation: Independent    Toileting: Independent    Bathing: Independent        IADLs:    Using the telephone: Independent    Shopping: Independent    Meal preparation: Independent    Medication management: Independent    Managing finances: Independent    Housework: Dependent    Transportation (driving or navigating public transit): Independent         Assistive devices: electric W/C for longer distances;  Has quad cane          ----        Lives at home with husband. Independent, drives. Has 3 adult children. Former smoker. Rare alcohol use. No recreational drugs.      Social Drivers of Psychologist, prison and probation services Strain: Low Risk  (03/03/2023)    Overall Financial Resource Strain (CARDIA)     Difficulty of Paying Living Expenses: Not hard at all   Food Insecurity: No Food Insecurity (03/03/2023)    Hunger Vital Sign     Worried About Running Out of Food in the Last Year: Never true     Ran Out of Food in the Last Year: Never true   Transportation Needs: No Transportation Needs (03/03/2023)    PRAPARE - Therapist, art (Medical): No     Lack of Transportation (Non-Medical): No       Allergies: is allergic to macrobid [nitrofurantoin monohyd/m-cryst] and nitrofurantoin.    Medications:   Meds:   amlodipine  5 mg Oral Daily    mycophenolate  360 mg Oral BID    tacrolimus  0.5 mg Oral BID Continuous Infusions:  PRN Meds:.    Objective:   Vitals: Temp:  [36.4 ??C (97.5 ??F)-36.9 ??C (98.5 ??F)] 36.9 ??C (98.5 ??F)  Heart Rate:  [62-67] 63  SpO2 Pulse:  [63-67] 66  Resp:  [16-22] 16  BP: (128-151)/(54-106) 139/55  MAP (mmHg):  [78-117] 79  SpO2:  [90 %-98 %] 91 %  BMI (Calculated):  [36.86] 36.86    Physical Exam:  BP 139/55  - Pulse 63  - Temp 36.9 ??C (98.5 ??F) (Oral)  - Resp 16  - Wt 88 kg (194 lb)  - SpO2 91%  - BMI 36.68 kg/m??    Gen - well appearing, in no acute distress  Eyes - conjunctivae clear, PERRL  ENT - oral mucosa clear, dentition normal  Resp - +nasal cannula, symmetric excursion, normal work of breathing  CV - warm, well perfused, no cyanosis  GI - abdomen nondistended  Skin - no rashes, no lesions noted on visible skin  Neuro - AOx3, EOM intact, no facial droop, speech fluent and coherent, moves all extremities without asymmetry  Psych - affect normal, mood okay  MSK - no joint swelling or effusions noted      Test Results  Recent Labs  08/26/23  1217 08/26/23  2104   WBC 11.7* 10.2   NEUTROABS 10.1* 8.6*   HGB 10.0* 11.1*   PLT 1,453* 1,542*       Imaging: Radiology studies were personally reviewed    PVLS negative foro DVT

## 2023-08-28 DIAGNOSIS — I272 Pulmonary hypertension, unspecified: Principal | ICD-10-CM

## 2023-08-28 DIAGNOSIS — Z940 Kidney transplant status: Principal | ICD-10-CM

## 2023-08-28 DIAGNOSIS — D473 Essential (hemorrhagic) thrombocythemia: Principal | ICD-10-CM

## 2023-08-28 DIAGNOSIS — I2699 Other pulmonary embolism without acute cor pulmonale: Principal | ICD-10-CM

## 2023-08-28 DIAGNOSIS — R7989 Other specified abnormal findings of blood chemistry: Principal | ICD-10-CM

## 2023-08-28 LAB — RENAL FUNCTION PANEL
ALBUMIN: 3.3 g/dL — ABNORMAL LOW (ref 3.4–5.0)
ANION GAP: 16 mmol/L — ABNORMAL HIGH (ref 5–14)
BLOOD UREA NITROGEN: 58 mg/dL — ABNORMAL HIGH (ref 9–23)
BUN / CREAT RATIO: 30
CALCIUM: 9.4 mg/dL (ref 8.7–10.4)
CHLORIDE: 104 mmol/L (ref 98–107)
CO2: 23 mmol/L (ref 20.0–31.0)
CREATININE: 1.95 mg/dL — ABNORMAL HIGH (ref 0.55–1.02)
EGFR CKD-EPI (2021) FEMALE: 27 mL/min/{1.73_m2} — ABNORMAL LOW (ref >=60)
GLUCOSE RANDOM: 148 mg/dL (ref 70–179)
PHOSPHORUS: 3.2 mg/dL (ref 2.4–5.1)
POTASSIUM: 3.8 mmol/L (ref 3.4–4.8)
SODIUM: 143 mmol/L (ref 135–145)

## 2023-08-28 LAB — CBC
HEMATOCRIT: 29.2 % — ABNORMAL LOW (ref 34.0–44.0)
HEMOGLOBIN: 9.7 g/dL — ABNORMAL LOW (ref 11.3–14.9)
MEAN CORPUSCULAR HEMOGLOBIN CONC: 33.2 g/dL (ref 32.0–36.0)
MEAN CORPUSCULAR HEMOGLOBIN: 39.7 pg — ABNORMAL HIGH (ref 25.9–32.4)
MEAN CORPUSCULAR VOLUME: 119.5 fL — ABNORMAL HIGH (ref 77.6–95.7)
MEAN PLATELET VOLUME: 8 fL (ref 6.8–10.7)
PLATELET COUNT: 1546 10*9/L (ref 150–450)
RED BLOOD CELL COUNT: 2.45 10*12/L — ABNORMAL LOW (ref 3.95–5.13)
RED CELL DISTRIBUTION WIDTH: 15.1 % (ref 12.2–15.2)
WBC ADJUSTED: 9.2 10*9/L (ref 3.6–11.2)

## 2023-08-28 LAB — MAGNESIUM: MAGNESIUM: 2.1 mg/dL (ref 1.6–2.6)

## 2023-08-28 LAB — TACROLIMUS LEVEL, TROUGH: TACROLIMUS, TROUGH: 5.4 ng/mL (ref 5.0–15.0)

## 2023-08-28 MED ADMIN — umeclidinium (INCRUSE ELLIPTA) 62.5 mcg/actuation inhaler 1 puff: 1 | RESPIRATORY_TRACT | @ 03:00:00

## 2023-08-28 MED ADMIN — pravastatin (PRAVACHOL) tablet 40 mg: 40 mg | ORAL | @ 15:00:00 | Stop: 2023-08-28

## 2023-08-28 MED ADMIN — cholecalciferol (vitamin D3 25 mcg (1,000 units)) tablet 25 mcg: 25 ug | ORAL | @ 15:00:00 | Stop: 2023-08-28

## 2023-08-28 MED ADMIN — heparin (porcine) 5,000 unit/mL injection 5,000 Units: 5000 [IU] | SUBCUTANEOUS | @ 21:00:00 | Stop: 2023-08-28

## 2023-08-28 MED ADMIN — cephalexin (KEFLEX) capsule 250 mg: 250 mg | ORAL | @ 15:00:00 | Stop: 2023-08-28

## 2023-08-28 MED ADMIN — sodium bicarbonate tablet 650 mg: 650 mg | ORAL | @ 15:00:00 | Stop: 2023-08-28

## 2023-08-28 MED ADMIN — heparin (porcine) 5,000 unit/mL injection 5,000 Units: 5000 [IU] | SUBCUTANEOUS | @ 03:00:00

## 2023-08-28 MED ADMIN — hydroxyurea (HYDREA) capsule 1,000 mg: 1000 mg | ORAL | @ 14:00:00 | Stop: 2023-08-28

## 2023-08-28 MED ADMIN — heparin (porcine) 5,000 unit/mL injection 5,000 Units: 5000 [IU] | SUBCUTANEOUS | @ 11:00:00 | Stop: 2023-08-28

## 2023-08-28 MED ADMIN — tacrolimus (PROGRAF) capsule 0.5 mg: .5 mg | ORAL | @ 03:00:00

## 2023-08-28 MED ADMIN — mycophenolate (MYFORTIC) EC tablet 360 mg: 360 mg | ORAL | @ 14:00:00 | Stop: 2023-08-28

## 2023-08-28 MED ADMIN — aspirin chewable tablet 81 mg: 81 mg | ORAL | @ 15:00:00 | Stop: 2023-08-28

## 2023-08-28 MED ADMIN — sodium bicarbonate tablet 650 mg: 650 mg | ORAL | @ 03:00:00

## 2023-08-28 MED ADMIN — metFORMIN (GLUCOPHAGE) tablet 500 mg: 500 mg | ORAL | @ 15:00:00 | Stop: 2023-08-28

## 2023-08-28 MED ADMIN — amlodipine (NORVASC) tablet 5 mg: 5 mg | ORAL | @ 15:00:00 | Stop: 2023-08-28

## 2023-08-28 MED ADMIN — tacrolimus (PROGRAF) capsule 0.5 mg: .5 mg | ORAL | @ 14:00:00 | Stop: 2023-08-28

## 2023-08-28 MED ADMIN — mycophenolate (MYFORTIC) EC tablet 360 mg: 360 mg | ORAL | @ 03:00:00

## 2023-08-28 MED ADMIN — carvedilol (COREG) tablet 25 mg: 25 mg | ORAL | @ 15:00:00 | Stop: 2023-08-28

## 2023-08-28 MED ADMIN — carvedilol (COREG) tablet 25 mg: 25 mg | ORAL | @ 03:00:00

## 2023-08-28 MED ADMIN — allopurinol (ZYLOPRIM) tablet 100 mg: 100 mg | ORAL | @ 15:00:00 | Stop: 2023-08-28

## 2023-08-28 MED ADMIN — metFORMIN (GLUCOPHAGE) tablet 500 mg: 500 mg | ORAL | @ 03:00:00

## 2023-08-28 MED ADMIN — pantoprazole (Protonix) EC tablet 40 mg: 40 mg | ORAL | @ 21:00:00 | Stop: 2023-08-28

## 2023-08-28 NOTE — Unmapped (Signed)
 Attempted to see pt for OT Consult. Pt and pt's spouse declining OT consult/services during this admission stating that she is independent with ADL and is not interested in therapy services. Will DC OT consult at this time. Please re-consult should needs arise. Thank you!

## 2023-08-28 NOTE — Unmapped (Signed)
 PT orders received, pt and pt's spouse declining PT consult/services during this admission stating that she is not interested in therapy services. Will DC PT consult at this time. Please re-consult if status changes. Thanks!

## 2023-08-28 NOTE — Unmapped (Signed)
 Received pt from ED, alert and oriented, able to make needs known, husband at bedside. Denies pain or SOB. On 3L [place], baseline. Home CPAP on overnight. Skin assessment done with two nurses, intact. Enteric precaution d/c'd, no BM since Friday. Ambulates with standby assist with walker. Bladder scan done. Cont pulse and telemetry monitoring. Bed in low locked position. Call light within reach.     Problem: Adult Inpatient Plan of Care  Goal: Plan of Care Review  08/28/2023 0324 by Neisha Hinger, RN  Outcome: Ongoing - Unchanged  08/28/2023 0323 by Chau Savell, RN  Outcome: Ongoing - Unchanged  Goal: Patient-Specific Goal (Individualized)  08/28/2023 0324 by Lititia Sen, RN  Outcome: Ongoing - Unchanged  08/28/2023 0323 by Miasia Crabtree, RN  Outcome: Ongoing - Unchanged  Goal: Absence of Hospital-Acquired Illness or Injury  08/28/2023 0324 by Chandler Stofer, RN  Outcome: Ongoing - Unchanged  08/28/2023 0323 by Candie Gintz, RN  Outcome: Ongoing - Unchanged  Intervention: Identify and Manage Fall Risk  Recent Flowsheet Documentation  Taken 08/27/2023 2250 by Lasharon Dunivan, RN  Safety Interventions:   fall reduction program maintained   environmental modification   family at bedside   lighting adjusted for tasks/safety   low bed   isolation precautions   infection management   nonskid shoes/slippers when out of bed  Intervention: Prevent Infection  Recent Flowsheet Documentation  Taken 08/27/2023 2250 by Aedyn Mckeon, RN  Infection Prevention:   cohorting utilized   single patient room provided   personal protective equipment utilized  Goal: Optimal Comfort and Wellbeing  08/28/2023 0324 by Aymee Fomby, RN  Outcome: Ongoing - Unchanged  08/28/2023 0323 by Rayme Bui, RN  Outcome: Ongoing - Unchanged  Goal: Readiness for Transition of Care  08/28/2023 0324 by Kain Milosevic, RN  Outcome: Ongoing - Unchanged  08/28/2023 0323 by Davion Meara, RN  Outcome: Ongoing - Unchanged  Goal: Rounds/Family Conference  08/28/2023 0324 by Kyjuan Gause, RN  Outcome: Ongoing - Unchanged  08/28/2023 0323 by Cheralyn Oliver, RN  Outcome: Ongoing - Unchanged     Problem: Infection  Goal: Absence of Infection Signs and Symptoms  08/28/2023 0324 by Ousmane Seeman, RN  Outcome: Ongoing - Unchanged  08/28/2023 0323 by Kyree Adriano, RN  Outcome: Ongoing - Unchanged  Intervention: Prevent or Manage Infection  Recent Flowsheet Documentation  Taken 08/27/2023 2250 by Chaquetta Schlottman, RN  Infection Management: aseptic technique maintained  Isolation Precautions: enteric precautions maintained     Problem: Acute Kidney Injury/Impairment  Goal: Fluid and Electrolyte Balance  08/28/2023 0324 by Annslee Tercero, RN  Outcome: Ongoing - Unchanged  08/28/2023 0323 by Valeska Haislip, RN  Outcome: Ongoing - Unchanged  Goal: Improved Oral Intake  08/28/2023 0324 by Ronnette Rump, RN  Outcome: Ongoing - Unchanged  08/28/2023 0323 by Loring Liskey, RN  Outcome: Ongoing - Unchanged  Goal: Effective Renal Function  08/28/2023 0324 by Callyn Severtson, RN  Outcome: Ongoing - Unchanged  08/28/2023 0323 by Nelline Lio, RN  Outcome: Ongoing - Unchanged     Problem: Fall Injury Risk  Goal: Absence of Fall and Fall-Related Injury  Outcome: Ongoing - Unchanged

## 2023-08-28 NOTE — Unmapped (Signed)
 Physician Discharge Summary [facility]  3 [facility]  [address]  [place] [address]  Dept: [phone]  Loc: [phone]     Identifying Information:   Erin Welch  [birth year]  [id number]    Primary Care Physician: Mack Guise, MD     Code Status: DNR and DNI    Admit Date: 08/26/2023    Discharge Date: 08/28/2023     Discharge To: Home    Discharge Service: [facility] - Nephrology Floor Team (MED B Alvester Morin)     Discharge Attending Physician: Dr. 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 [facility] Course:   Outpatient follow up:  [ ]  Plan for outpatient VQ scan - Transplant Nephrology team to arrange  [ ]  Recommend renal function panel and CBC this week   [ ]  Holding metformin, Jardiance, and keeping losartan at 1/2 dose (25 mg) due to renal function - follow up to assess restarting  [ ]  Stopped pacritinib and transitioned back to hydroxyurea - will follow up with Hematology    ---------------------    Erin Welch is a 73 y.o. female whose presentation is complicated by  essential thrombocythemia, s/p deceased donor kidney transplant 04/04/2013, HTN, COPD, pulmonary HTN, HFpEF, OSA, DMII that presented to [facility] with AKI, severe elvation of PLT (>1.5 million), and ongoing back pain and cough.     She was sent to ER by Transplant Nephrology team with concern for PE given cough and flank pain with increased pulmonary HTN by TTE indices, with D-dimer within age-adjusted limits and with no clots on bilateral LE PVLs. No change in baseline O2, and she felt close to baseline on evaluation by admitting Med B team. Deferred CTA chest given AKI and discussed inpatient VQ scan, but patient much preferred to go home with plan for close outpatient follow up. Communicated plan with Transplant Nephrology, and an outpatient VQ scan will be arranged.     Other notable hospital course as below:     Cough I Flank Pain   Has had 2 weeks of ongoing worsening cough associated several days of lower back pain. Pain in the back/flank area worsens with coughing and deep inspiration.  She has chronic SOB/DOE and is on 3 L O2 at baseline with no increase in dyspnea or oxygen requirement at home.  No chest pain or lower extremity edema. No hemoptysis.  Ultimately sent here by primary nephrologist to rule out PE.  In ED patient afebrile, hemodynamically stable, and saturating well on home 3 L nasal cannula.  High-sensitivity troponin negative x 2.  proBNP greater than 4000.  RPP negative.  D-dimer negative based on age adjustment.  CXR with  pulmonary vascular congestion.  Bilateral lower extremity PVLs negative for DVT.  Given her severely elevated platelet count she is at high risk for thromboembolism, but work-up was overall reassuring. She felt well at time of discharge.   - Offered inpatient VQ scan, which patient declined - will obtain this outpatient with Transplant Nephrology team    AKI I Status post kidney transplant  I Immunocompromised Host   Follows with Dr. Margaretmary Bayley, [place] transplant nephrology.  Baseline creatinine <1.7 mg/dL with creatinine up to 6.3-8.7.  Recommended admission by outpatient nephrologist for further workup.  Renal transplant ultrasound with decreased resistive indices compared to prior, decreased velocities in the main renal artery, and partially distended bladder.  UA noninfectious with no proteinuria.  She has had ongoing GI symptoms with loose stools.  Concern AKI may be secondary to prerenal etiology in the setting of GI losses and recent short-lives n/v at home.  Less likely to be secondary to tacrolimus nephrotoxicity given stable Tac levels. Less likely to be secondary to ATN or rejection. She has no prior history of rejection or history of DSAs (most recent screen 05/16/23).   - Transplant nephrology notified of admission, and Cr was downtrending to <2 by discharge  - Will discharge holding metformin and Jardiance, with 1/2 home dosing of losartan  - No changes made to immunosuppression.    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 Anise Salvo and Synetta Fail, AGNP. Recently came off of her Hydroxyurea to start pacritinib. Platelet count in clinic found to be 1,453,000. Evaluated by hematology in ED. Her elevated platelets put her at risk for both clots and bleeding (via vWF being low attaching to the PLTs), thus cytoreductive therapy is indicated to rapidly reduce the platelet count.  Stopped the pacritinib per Hematology recs and restarted hydroxyurea at 1500 mg and 1000 mg on alternating days.     Chronic Hypoxemic Respiratory Failure I Severe Pulmonary HTN I COPD  I HFpEF  Follows with Dr. Ala Dach at [facility]. Has known likely group 2 pulmonary hypertension with her left heart disease from diastolic dysfunction and group 3 PAH with her COPD and sleep disordered breathing.   Prior PFTs and imaging consistent with COPD.  No ILD noted on CT. Dyspnea stable on admission.  O2 requirement stable at 3 L. CXR does show signs of congestion and she has elevated pro-BNP. Will discharge on home Lasix and MRA.     Nausea I Vomiting I Diarrhea  1 day of N/V/D, now resolved. May be contributing to AKI. Patient attributed this to taking one dose of tramadol.      Chronic Problems     History of recurrent UTI.    She currently has no symptoms of UTI    - Continue Keflex ppx      Hypertension management I Adrenal Adenoma  Right adrenal adenoma 3.0 cm in size noted on chest CT 07/16/22 (was 2.7 cm 12/27/2014). Per nephrology will not plan to pursue intervention on adrenal adenoma given excellent BP control previously      DM, type 2  - Holding home Metformin   - Holding home Jardiance      Gout  - Home allopurinol continued     The patient's hospital stay has been complicated by the following clinically significant conditions requiring additional evaluation and treatment or having a significant effect of this patient's care: - Chronic kidney disease POA requiring further investigation, treatment, or monitoring  Outpatient Provider Follow Up Issues:   See above    Touchbase with Outpatient Provider:  Warm Handoff: Completed on 08/28/23 by Starleen Blue, MD  (Resident) via [facility]    Procedures:  None  ______________________________________________________________________  Discharge Medications:      Your Medication List        PAUSE taking these medications      JARDIANCE 10 mg tablet  Wait to take this until your doctor or other care provider tells you to start again.  Generic drug: empagliflozin  Take 1 tablet by mouth once daily     metFORMIN 500 MG tablet  Wait to take this until your doctor or other care provider tells you to start again.  Commonly known as: GLUCOPHAGE  Take 1 tablet (500 mg total) by mouth in the morning and 1 tablet (500 mg total) in the evening. Take with meals.            STOP taking these medications      VONJO 100 mg capsule  Generic drug: pacritinib            CHANGE how you take these medications      hydroxyurea 500 mg capsule  Commonly known as: HYDREA  Take 3 capsules (1,500 mg total) by mouth every other day. Take 3 capsules (1500 mg total) every other day, with 2 capsules (1000 mg total) on the other days.  Start taking on: August 29, 2023  What changed: You were already taking a medication with the same name, and this prescription was added. Make sure you understand how and when to take each.     hydroxyurea 500 mg capsule  Commonly known as: HYDREA  Take 2 capsules (1,000 mg total) by mouth every other day.  Start taking on: August 30, 2023  What changed: when to take this     losartan 50 MG tablet  Commonly known as: COZAAR  Take 0.5 tablets (25 mg total) by mouth daily. Take 1/2 of usual tablet, for total of 25 mg daily, until you follow up with your nephrologist and are told to increase back to your usual dosing  What changed:   how much to take  additional instructions            CONTINUE taking these medications      acetaminophen 650 MG CR tablet  Commonly known as: TYLENOL 8 HOUR  Take 1 tablet (650 mg total) by mouth nightly.     albuterol 90 mcg/actuation inhaler  Commonly known as: PROVENTIL HFA;VENTOLIN HFA  Inhale 2 puffs every six (6) hours as needed for wheezing or shortness of breath.     allopurinol 100 MG tablet  Commonly known as: ZYLOPRIM  TAKE 2 TABLETS BY MOUTH IN THE MORNING     amlodipine 5 MG tablet  Commonly known as: NORVASC  Take 1 tablet by mouth once daily     aspirin 81 MG tablet  Commonly known as: ECOTRIN  Take 1 tablet (81 mg total) by mouth daily.     carvedilol 25 MG tablet  Commonly known as: COREG  Take 1 tablet by mouth twice daily     cephalexin 250 MG capsule  Commonly known as: KEFLEX  Take 1 capsule by mouth once daily     cholecalciferol (vitamin D3 25 mcg (1,000 units)) 1,000 unit (25 mcg) tablet  Take 1 tablet (25 mcg total) by mouth daily.     colchicine 0.6 mg tablet  Commonly known as:  COLCRYS  Take 1 tablet (0.6 mg total) by mouth two (2) times a day as needed. For acute gout, take 2 tablets by mouth, then 1 tablet one hour later on first day. May continue 1 tablet up to twice a day as needed.     eplerenone 25 MG tablet  Commonly known as: INSPRA  Take 1 tablet (25 mg total) by mouth daily. epoetin alfa 40,000 unit/mL injection  Commonly known as: EPOGEN,PROCRIT  Inject 1 mL (40,000 Units total) under the skin once a week.     fish oil-omega-3 fatty acids 300-1,000 mg capsule  Take 1 capsule (1 g total) by mouth daily.     fluticasone propionate 50 mcg/actuation nasal spray  Commonly known as: FLONASE  Use 1 spray(s) in each nostril twice daily     furosemide 40 MG tablet  Commonly known as: LASIX  Take 1 tablet (40 mg total) by mouth two (2) times a day.     glucosamine-chondroitin 500-400 mg tablet  Take 1 tablet by mouth Two (2) times a day.     INCRUSE ELLIPTA 62.5 mcg/actuation inhaler  Generic drug: umeclidinium  Inhale 1 puff by mouth once daily     MEDICAL SUPPLY ITEM  14 cm into each nostril nightly. CPAP Auto titrating machine 14cmH2O 3PR  [employer] is patient's DME provider.     melatonin 10 mg Tab  Take 1 tablet (10 mg total) by mouth nightly.     MYFORTIC 180 mg EC tablet  Generic drug: mycophenolate  Take 2 tablets (360 mg total) by mouth two (2) times a day.     pantoprazole 40 MG tablet  Commonly known as: Protonix  Take 1 tablet by mouth once daily     pravastatin 40 MG tablet  Commonly known as: PRAVACHOL  Take 1 tablet (40 mg total) by mouth daily.     PROGRAF 0.5 mg capsule  Generic drug: tacrolimus  Take 2 capsules (1 mg total) by mouth in the morning. Take 1 mg in morning and 0.5 mg in evening.     tacrolimus 0.5 MG capsule  Commonly known as: PROGRAF  Take 1 capsule (0.5 mg total) by mouth nightly. Take 1 mg in morning and 0.5 mg in evening     sodium bicarbonate 650 mg tablet  Take 1 tablet (650 mg total) by mouth two (2) times a day. NEW DOSE     zolpidem 5 MG tablet  Commonly known as: AMBIEN  Take 1 tablet (5 mg total) by mouth nightly as needed.              Allergies:  Macrobid [nitrofurantoin monohyd/m-cryst] and Nitrofurantoin  ______________________________________________________________________  Pending Test Results:  Pending Labs       Order Current Status    CMV DNA, quantitative, PCR In process    Epstein-Barr virus VCA antibody panel In process    von Willebrand Factor Panel In process    von Willebrand Panel In process            Most Recent Labs:  All lab results last 24 hours -   Recent Results (from the past 24 hours)   CBC    Collection Time: 08/28/23  7:28 AM   Result Value Ref Range    WBC 9.2 3.6 - 11.2 10*9/L    RBC 2.45 (L) 3.95 - 5.13 10*12/L    HGB 9.7 (L) 11.3 - 14.9 g/dL    HCT 09.8 (L) 11.9 - 44.0 %  MCV 119.5 (H) 77.6 - 95.7 fL    MCH 39.7 (H) 25.9 - 32.4 pg    MCHC 33.2 32.0 - 36.0 g/dL    RDW 29.5 62.1 - 30.8 %    MPV 8.0 6.8 - 10.7 fL    Platelet 1,546 (HH) 150 - 450 10*9/L   Renal Function Panel    Collection Time: 08/28/23  7:28 AM   Result Value Ref Range    Sodium 143 135 - 145 mmol/L    Potassium 3.8 3.4 - 4.8 mmol/L    Chloride 104 98 - 107 mmol/L    CO2 23.0 20.0 - 31.0 mmol/L    Anion Gap 16 (H) 5 - 14 mmol/L    BUN 58 (H) 9 - 23 mg/dL    Creatinine 6.57 (H) 0.55 - 1.02 mg/dL    BUN/Creatinine Ratio 30     eGFR CKD-EPI (2021) Female 27 (L) >=60 mL/min/1.32m2    Glucose 148 70 - 179 mg/dL    Calcium 9.4 8.7 - 84.6 mg/dL    Phosphorus 3.2 2.4 - 5.1 mg/dL    Albumin 3.3 (L) 3.4 - 5.0 g/dL   Magnesium Level    Collection Time: 08/28/23  7:28 AM   Result Value Ref Range    Magnesium 2.1 1.6 - 2.6 mg/dL   Tacrolimus Level, Trough    Collection Time: 08/28/23  7:28 AM   Result Value Ref Range    Tacrolimus, Trough 5.4 5.0 - 15.0 ng/mL       Relevant Studies/Radiology:  US Renal Transplant W Doppler  Result Date: 08/27/2023  EXAM: US RENAL TRANSPLANT [place] ACCESSION: [id number] UN REPORT DATE: 08/26/2023 11:25 PM CLINICAL INDICATION: 73 years old with AKI, recommended per transplant nephrology COMPARISON: 02/28/2023 renal transplant Doppler ultrasound TECHNIQUE:  Ultrasound views of the renal transplant were obtained using gray scale and color and spectral Doppler imaging. Views of the urinary bladder were obtained using gray scale and limited color Doppler imaging. FINDINGS: TRANSPLANTED KIDNEY: The renal transplant was located in the left lower quadrant. Normal size and echogenicity.  No solid masses or calculi. No perinephric collections identified. No hydronephrosis. VESSELS: - Perfusion: Using power Doppler, decreased perfusion was seen in the upper pole renal parenchyma, similar to prior. - Resistive indices in the renal transplant are decreased compared with prior examination. - Main renal artery/iliac artery: Patent - Main renal vein/iliac vein: Patent BLADDER: Partially distended with calculated bladder volume of 322.8 mL.     --Decreased resistive indices in the renal transplant arteries compared to 02/28/2023, now only mildly elevated. --Decreased velocities in the main renal artery, now measuring up to 0.9 m/s at the anastomosis and previously 4.4 m/s. --Redemonstrated patchy power Doppler perfusion in the upper pole of the transplant kidney. --Partially distended bladder with calculated volume of 322.8 mL. Patient unable to void per sonographer report. Please see below for data measurements: Transplant location: LLQ Renal Transplant: Sagittal 11.2 cm; AP 5.5 cm; Transverse 4.4 cm Segmental artery superior resistive index: 0.70 Segmental artery mid resistive index: 0.87 Segmental artery inferior resistive index: 0.80 Previous resistive indices range of segmental arteries: 1 Main renal artery peak systolic velocity at anastomosis: 0.90 m/s Main renal artery hilum resistive index: 0.87 Main renal artery mid resistive index: 0.85 Main renal artery anastomosis resistive index: 0.82 Previous resistive indices range of main renal artery: 1 Main renal vein: patent Iliac artery: Patent Iliac vein: Patent Bladder volume prevoid: 322.8  mL Bladder volume postvoid: mL     US Venous  Doppler Lower Extremity Bilateral  Result Date: 08/27/2023  EXAM: US VENOUS DOPPLER LOWER EXTREMITY BILATERAL ACCESSION: [id number] UN REPORT DATE: 08/26/2023 11:27 PM CLINICAL INDICATION: 73 years old with concern for dvt COMPARISON: None TECHNIQUE: The bilateral common femoral, femoral, and popliteal veins were evaluated for the presence of intraluminal obstruction.  Posterior tibial and peroneal calf veins were also evaluated. Transverse views were used to assess venous compressibility. Longitudinal orientation was used for Doppler assessment of hemodynamic flow characteristics.  FINDINGS: Evaluation is somewhat limited secondary to patient body habitus and poor penetration, particularly in the calf veins. The common femoral, femoral, popliteal, and deep femoral veins are patent and free of thrombus bilaterally. The veins are normally compressible and have normal phasic flow. The main trunks of the greater saphenous veins adjacent to the saphenofemoral junctions are patent and free of thrombus bilaterally. The paired peroneal and posterior tibial calf veins are patent bilaterally. Other: None.     No deep vein thrombosis in the bilateral lower extremities.     XR Chest 2 views  Result Date: 08/26/2023  EXAM: XR CHEST 2 VIEWS ACCESSION: [id number] UN REPORT DATE: 08/26/2023 10:22 PM CLINICAL INDICATION: SHORTNESS OF BREATH  TECHNIQUE: PA and Lateral Chest Radiographs. COMPARISON: 05/20/2023 FINDINGS: Pulmonary vascular congestion.  No pleural effusion or pneumothorax. Stable enlarged cardiac silhouette. Dilated pulmonary artery which may reflect pulmonary hypertension.     Pulmonary vascular congestion. Cardiomegaly.    ECG 12 Lead  Result Date: 08/26/2023  SINUS RHYTHM WITH 1ST DEGREE AV BLOCK OTHERWISE NORMAL ECG WHEN COMPARED WITH ECG OF 26-Jul-2023 15:42, PR INTERVAL HAS INCREASED Confirmed by Eldred Manges [id number]) on 08/26/2023 5:56:49 PM   ______________________________________________________________________  Discharge Instructions:   Activity Instructions       Activity as tolerated                       Follow Up instructions and Outpatient Referrals     Call MD for:  difficulty breathing, headache or visual disturbances      Call MD for:  persistent nausea or vomiting      Call MD for:  severe uncontrolled pain      Call MD for:  temperature >38.5 Celsius      Discharge instructions          Appointments which have been scheduled for you      Sep 19, 2023 11:30 AM  (Arrive by 11:00 AM)  LAB ONLY [place] with ADULT ONC LAB  [facility] ADULT ONCOLOGY LAB DRAW STATION [facility] REGION) [address]  [place] [address]  [phone]        Sep 19, 2023 12:30 PM  (Arrive by 12:00 PM)  RETURN ACTIVE [place] with Vernie Murders, [place]  [place] HEMATOLOGY ONCOLOGY 2ND FLR CANCER HOSP [facility] REGION) [address]  [place] [address]  [phone]        Sep 19, 2023 1:00 PM  (Arrive by 12:30 PM)  RETURN ACTIVE [place] with [name], CPP  [facility] HEMATOLOGY ONCOLOGY 2ND FLR CANCER HOSP [facility] REGION) [address]  [place] [address]  [phone]        Nov 04, 2023 2:30 PM  (Arrive by 2:15 PM)  RETURN  PULM HYPERTENSION with Jettie Booze, MD  [facility] PULMONARY SPECIALTY CL [place] [facility] REGION) [address]  [facility] 1 through 4  [place] [address]  [phone]  Nov 10, 2023 8:15 AM  (Arrive by 8:00 AM)  NEW MOHS with Coralie Carpen, MD  [facility] MOHS SURGERY [place] [facility] REGION) [address]  [place] [address]  [phone]        Nov 18, 2023 2:40 PM  (Arrive by 2:25 PM)  RETURN CARDIOLOGY with Carilyn Goodpasture, MD  [facility] CARDIOLOGY [place] [facility] REGION) [address]  [facility] 1 through 4  [place] [address]  [phone]        Dec 20, 2023 1:30 PM  (Arrive by 1:15 PM)  RETURN GENERAL with Arvilla Market, MD  [facility] DERMATOLOGY AND [facility]ER C[facility] [place] [facility] REGION) [address] [place] [address]  [phone]        Jan 16, 2024 10:45 AM  (Arrive by 10:15 AM)  LAB ONLY [place] with ADULT ONC LAB  [facility] ADULT ONCOLOGY LAB DRAW STATION [place] [facility] REGION) [address]  [place] [address]  [phone]        Jan 16, 2024 11:45 AM  (Arrive by 11:15 AM)  RETURN ACTIVE [place] with Vito Berger, MD  [place] HEMATOLOGY ONCOLOGY 2ND FLR CANCER HOSP [facility] REGION) [address]  [place] [address]  [phone]        Jan 16, 2024 1:00 PM  (Arrive by 12:30 PM)  RETURN ACTIVE [place] with [name], CPP  [place] HEMATOLOGY ONCOLOGY 2ND FLR CANCER HOSP [facility] REGION) [address]  [place] [address]  [phone]        Jan 24, 2024 2:00 PM  (Arrive by 1:30 PM)  CT CHEST WO CONTRAST with [facility] CT RM 4  IMG CT [facility]) [address]  [place] [address]  [phone]   Let us know if pt:  Pregnant or nursing  Claustrophobic    (Title:[id number])         Jan 24, 2024 3:00 PM  (Arrive by 2:30 PM)  FOLLOW UP 15 with Marshell Garfinkel, MD  [facility] RADIATION ONCOLOGY [place] [facility] REGION) [address]  [place] [address]  [phone]             ______________________________________________________________________  Discharge Day Services:  BP 144/72  - Pulse 70  - Temp 36.6 ??C (97.9 ??F) (Oral)  - Resp 18  - Ht 152.4 cm (5')  - Wt 88 kg (194 lb)  - SpO2 92%  - BMI 37.89 kg/m??     Pt seen on the day of discharge and determined appropriate for discharge.    Condition at Discharge: stable    Length of Discharge: I spent greater than 30 mins in the discharge of this patient.

## 2023-08-29 DIAGNOSIS — D473 Essential (hemorrhagic) thrombocythemia: Principal | ICD-10-CM

## 2023-08-29 LAB — VON WILLEBRAND FACTOR PANEL
VON WILLEBRAND AG: 188 %{normal} — ABNORMAL HIGH (ref 47–157)
VON WILLEBRAND FACTOR ACTIVITY: 121 %{normal} (ref 50–168)

## 2023-08-29 LAB — EPSTEIN-BARR VIRUS ANTIBODY PANEL
EPSTEIN-BARR NUCLEAR ANTIGEN AB: POSITIVE — AB
EPSTEIN-BARR VCA IGG ANTIBODY: POSITIVE — AB
EPSTEIN-BARR VCA IGM ANTIBODY: NEGATIVE

## 2023-08-29 LAB — CMV DNA, QUANTITATIVE, PCR: CMV VIRAL LD: NOT DETECTED

## 2023-08-29 MED ORDER — HYDROXYUREA 500 MG CAPSULE
ORAL_CAPSULE | ORAL | 0 refills | 60 days | Status: CP
Start: 2023-08-29 — End: ?

## 2023-08-29 NOTE — Unmapped (Signed)
 Thank you for taking the time in speaking with me today. I am happy to hear that you are home and doing well. I wanted to provide you with the number for the 24/7 Nurse Line.  Please reference [phone] for the 24/7 Nurse line. This is for non-emergent needs or questions.    Arlana Hove, RN

## 2023-08-30 ENCOUNTER — Encounter: Admit: 2023-08-30 | Discharge: 2023-08-31 | Payer: MEDICARE | Attending: Internal Medicine | Primary: Internal Medicine

## 2023-08-30 ENCOUNTER — Other Ambulatory Visit: Admit: 2023-08-30 | Discharge: 2023-08-31 | Payer: MEDICARE

## 2023-08-30 ENCOUNTER — Inpatient Hospital Stay: Admit: 2023-08-30 | Discharge: 2023-08-31 | Payer: MEDICARE

## 2023-08-30 DIAGNOSIS — Z79899 Other long term (current) drug therapy: Principal | ICD-10-CM

## 2023-08-30 DIAGNOSIS — D631 Anemia in chronic kidney disease: Principal | ICD-10-CM

## 2023-08-30 DIAGNOSIS — N183 Anemia of chronic renal failure, stage 3 (moderate), unspecified whether stage 3a or 3b CKD (CMS-HCC): Principal | ICD-10-CM

## 2023-08-30 DIAGNOSIS — D473 Essential (hemorrhagic) thrombocythemia: Principal | ICD-10-CM

## 2023-08-30 DIAGNOSIS — Z940 Kidney transplant status: Principal | ICD-10-CM

## 2023-08-30 DIAGNOSIS — N1832 Anemia of chronic renal failure, stage 3b (CMS-HCC): Principal | ICD-10-CM

## 2023-08-30 LAB — COMPREHENSIVE METABOLIC PANEL
ALBUMIN: 3.4 g/dL (ref 3.4–5.0)
ALKALINE PHOSPHATASE: 61 U/L (ref 46–116)
ALT (SGPT): 9 U/L — ABNORMAL LOW (ref 10–49)
ANION GAP: 13 mmol/L (ref 5–14)
AST (SGOT): 14 U/L (ref <=34)
BILIRUBIN TOTAL: 0.3 mg/dL (ref 0.3–1.2)
BLOOD UREA NITROGEN: 54 mg/dL — ABNORMAL HIGH (ref 9–23)
BUN / CREAT RATIO: 29
CALCIUM: 9.6 mg/dL (ref 8.7–10.4)
CHLORIDE: 102 mmol/L (ref 98–107)
CO2: 25 mmol/L (ref 20.0–31.0)
CREATININE: 1.86 mg/dL — ABNORMAL HIGH (ref 0.55–1.02)
EGFR CKD-EPI (2021) FEMALE: 28 mL/min/{1.73_m2} — ABNORMAL LOW (ref >=60)
GLUCOSE RANDOM: 166 mg/dL (ref 70–179)
POTASSIUM: 3.8 mmol/L (ref 3.4–4.8)
PROTEIN TOTAL: 6.9 g/dL (ref 5.7–8.2)
SODIUM: 140 mmol/L (ref 135–145)

## 2023-08-30 LAB — IRON & TIBC
IRON SATURATION: 20 % (ref 20–55)
IRON: 36 ug/dL — ABNORMAL LOW (ref 50–170)
TOTAL IRON BINDING CAPACITY: 179 ug/dL — ABNORMAL LOW (ref 250–425)

## 2023-08-30 LAB — CBC W/ AUTO DIFF
BASOPHILS ABSOLUTE COUNT: 0.1 10*9/L (ref 0.0–0.1)
BASOPHILS RELATIVE PERCENT: 0.6 %
EOSINOPHILS ABSOLUTE COUNT: 0.1 10*9/L (ref 0.0–0.5)
EOSINOPHILS RELATIVE PERCENT: 0.6 %
HEMATOCRIT: 29.5 % — ABNORMAL LOW (ref 34.0–44.0)
HEMOGLOBIN: 9.5 g/dL — ABNORMAL LOW (ref 11.3–14.9)
LYMPHOCYTES ABSOLUTE COUNT: 0.4 10*9/L — ABNORMAL LOW (ref 1.1–3.6)
LYMPHOCYTES RELATIVE PERCENT: 4.4 %
MEAN CORPUSCULAR HEMOGLOBIN CONC: 32.4 g/dL (ref 32.0–36.0)
MEAN CORPUSCULAR HEMOGLOBIN: 38.2 pg — ABNORMAL HIGH (ref 25.9–32.4)
MEAN CORPUSCULAR VOLUME: 118 fL — ABNORMAL HIGH (ref 77.6–95.7)
MEAN PLATELET VOLUME: 7.9 fL (ref 6.8–10.7)
MONOCYTES ABSOLUTE COUNT: 0.8 10*9/L (ref 0.3–0.8)
MONOCYTES RELATIVE PERCENT: 8.5 %
NEUTROPHILS ABSOLUTE COUNT: 8.4 10*9/L — ABNORMAL HIGH (ref 1.8–7.8)
NEUTROPHILS RELATIVE PERCENT: 85.9 %
PLATELET COUNT: 1565 10*9/L (ref 150–450)
RED BLOOD CELL COUNT: 2.5 10*12/L — ABNORMAL LOW (ref 3.95–5.13)
RED CELL DISTRIBUTION WIDTH: 15 % (ref 12.2–15.2)
WBC ADJUSTED: 9.7 10*9/L (ref 3.6–11.2)

## 2023-08-30 LAB — TACROLIMUS LEVEL, TROUGH: TACROLIMUS, TROUGH: 11.8 ng/mL (ref 5.0–15.0)

## 2023-08-30 LAB — FERRITIN: FERRITIN: 1066.1 ng/mL — ABNORMAL HIGH (ref 7.3–270.7)

## 2023-08-30 LAB — ERYTHROPOIETIN: ERYTHROPOIETIN: 50.3 m[IU]/mL — ABNORMAL HIGH

## 2023-08-30 LAB — MAGNESIUM: MAGNESIUM: 2 mg/dL (ref 1.6–2.6)

## 2023-08-30 LAB — PHOSPHORUS: PHOSPHORUS: 3.4 mg/dL (ref 2.4–5.1)

## 2023-08-30 MED ORDER — DARBEPOETIN ALFA 300 MCG/0.6 ML IN POLYSORBATE INJECTION SYRINGE
SUBCUTANEOUS | 11 refills | 28.00 days | Status: CP
Start: 2023-08-30 — End: ?
  Filled 2023-09-07: qty 0.6, 28d supply, fill #0

## 2023-08-30 NOTE — Unmapped (Signed)
 [facility] Other Call ONC Phone Room Smart Lists: General -     Hi,     Shirely contacted the Communication Center regarding the following:    - Requested to let the infusion team know that she is on her way but may be late.    Please contact Deanie at [phone].    Thanks in advance,    Sharl Ma  [facility]   [phone]    [facility]

## 2023-08-30 NOTE — Unmapped (Signed)
 Left

## 2023-08-30 NOTE — Unmapped (Signed)
 If you feel like this is an emergency please call 911.  For appointments or questions Monday through Friday 8AM-5PM please call [phone] or Toll Free [phone]. For Medical questions or concerns ask for the Nurse Triage Line.  On Nights, Weekends, and Holidays call [phone] and ask for the Oncologist on Call.  Reasons to call the Nurse Triage Line:  Fever of 100.5 or greater  Nausea and/or vomiting not relived with nausea medicine  Diarrhea or constipation  Severe pain not relieved with usual pain regimen  Shortness of breath  Uncontrolled bleeding  Mental status changes  Lab on 08/30/2023   Component Date Value Ref Range Status    WBC 08/30/2023 9.7  3.6 - 11.2 10*9/L Final    RBC 08/30/2023 2.50 (L)  3.95 - 5.13 10*12/L Final    HGB 08/30/2023 9.5 (L)  11.3 - 14.9 g/dL Final    HCT 57/84/6962 29.5 (L)  34.0 - 44.0 % Final    MCV 08/30/2023 118.0 (H)  77.6 - 95.7 fL Final    MCH 08/30/2023 38.2 (H)  25.9 - 32.4 pg Final    MCHC 08/30/2023 32.4  32.0 - 36.0 g/dL Final    RDW 95/28/4132 15.0  12.2 - 15.2 % Final    MPV 08/30/2023 7.9  6.8 - 10.7 fL Final    Platelet 08/30/2023 1,565 (HH)  150 - 450 10*9/L Final    Neutrophils % 08/30/2023 85.9  % Final    Lymphocytes % 08/30/2023 4.4  % Final    Monocytes % 08/30/2023 8.5  % Final    Eosinophils % 08/30/2023 0.6  % Final    Basophils % 08/30/2023 0.6  % Final    Absolute Neutrophils 08/30/2023 8.4 (H)  1.8 - 7.8 10*9/L Final    Absolute Lymphocytes 08/30/2023 0.4 (L)  1.1 - 3.6 10*9/L Final    Absolute Monocytes 08/30/2023 0.8  0.3 - 0.8 10*9/L Final    Absolute Eosinophils 08/30/2023 0.1  0.0 - 0.5 10*9/L Final    Absolute Basophils 08/30/2023 0.1  0.0 - 0.1 10*9/L Final    Macrocytosis 08/30/2023 Moderate (A)  Not Present Final

## 2023-08-30 NOTE — Unmapped (Signed)
 Note to APP [facility] requesting iron panel . Pt tolerated infusion of 1 unit RBCs, pt w cg, came to clinic using motorized wx and on 3L O2 from home, Pt declined AVS, pulled PIV

## 2023-08-30 NOTE — Unmapped (Signed)
 Left vm for the following :     Rydberg, Lavone Orn, MD  [facility] Desk Staff  Good evening,    Can we please offer this patient a hospital follow up visit within the next few weeks? Any provider ok    Thanks,    Virgel Paling, MD

## 2023-08-30 NOTE — Unmapped (Signed)
 Called and left patient message to come today at 10 for labs and possible transfusion.  Also sent patient MyChart message and asked for response to let me know if she can make it.

## 2023-08-30 NOTE — Unmapped (Signed)
 Per Dr. Margaretmary Bayley, pt can restart her jardiance, she will start today

## 2023-08-31 ENCOUNTER — Encounter: Admit: 2023-08-31 | Discharge: 2023-09-01 | Payer: MEDICARE | Attending: Adult Health | Primary: Adult Health

## 2023-08-31 DIAGNOSIS — Z940 Kidney transplant status: Principal | ICD-10-CM

## 2023-08-31 DIAGNOSIS — J449 Chronic obstructive pulmonary disease, unspecified: Principal | ICD-10-CM

## 2023-08-31 DIAGNOSIS — N179 Acute kidney failure, unspecified: Principal | ICD-10-CM

## 2023-08-31 DIAGNOSIS — D473 Essential (hemorrhagic) thrombocythemia: Principal | ICD-10-CM

## 2023-08-31 DIAGNOSIS — E1122 Type 2 diabetes mellitus with diabetic chronic kidney disease: Principal | ICD-10-CM

## 2023-08-31 DIAGNOSIS — I5030 Unspecified diastolic (congestive) heart failure: Principal | ICD-10-CM

## 2023-08-31 DIAGNOSIS — I272 Pulmonary hypertension, unspecified: Principal | ICD-10-CM

## 2023-08-31 MED ORDER — EMPTY CONTAINER
Freq: Once | 0 refills | 0.00 days | Status: CP
Start: 2023-08-31 — End: 2023-08-31

## 2023-08-31 NOTE — Unmapped (Signed)
 [facility] Specialty Medication Onboarding    Specialty Medication: ARANESP (IN POLYSORBATE) 300 mcg/0.6 mL Syrg (darbepoetin alfa-polysorbate)  Prior Authorization: Not Required   Financial Assistance: No - copay  <$25  Final Copay/Day Supply: $0 / 28 days    Insurance Restrictions: None     Notes to Pharmacist: none  Credit Card on File: no  Start Date on Rx:  08/30/23    The triage team has completed the benefits investigation and has determined that the patient is able to fill this medication at [facility]. Please contact the patient to complete the onboarding or follow up with the prescribing physician as needed.

## 2023-08-31 NOTE — Unmapped (Signed)
 Myeloproliferative Neoplasms & Bone Marrow Failure Clinic Follow Up Visit    Patient: Erin Welch  MRN: [id number]  DOB: September 02, 1950  Date of Visit: 08/31/2023       This patient visit was completed through the use of an audio/video or telephone encounter.    I spent 38 minutes on the real-time audio and video with the patient. I spent an additional 25 minutes on pre- and post-visit activities.     The patient was physically located in [place] or a state in which I am permitted to provide care. The patient understood that s/he may incur co-pays and cost sharing, and agreed to the telemedicine visit. The visit was completed via phone and/or video, which was appropriate and reasonable under the circumstances given the patient's presentation at the time.    The patient has been advised of the potential risks and limitations of this mode of treatment (including, but not limited to, the absence of in-person examination) and has agreed to be treated using telemedicine. The patient's/patient's family's questions regarding telemedicine have been answered.     If the phone/video visit was completed in an ambulatory setting, the patient has also been advised to contact their provider???s office for worsening conditions, and seek emergency medical treatment and/or call 911 if the patient deems either necessary.    Visit conducted by: Video  Person contacted: Christel Mormon Mutch  Contact phone number: [phone]  Is there someone else in the room? yes  What is the relationship? husband  Do you want the person here for the visit?  yes       Reason for Visit   Chakia Counts is a 73 y.o. with CALR-mutated ET diagnosed 05/2003, progressing toward post-ET myelofibrosis, s/p renal transplant (04/04/2013) maintained on tacrolimus, stage 1 lung cancer s/p XRT 06/19/2021, DM2, COPD and pulmonary hypertension who presents today in follow up after hospital discharge.      Interval History   Since being seen last in clinic on 07/20/23, Ms. Lanz had started on pacritinib 100mg  PO BID (08/15/23). Admitted to the hospital on 08/26/23 for worsening chronic cough, flank pain, and SOB.    Received 1u pRBCs yesterday due to symptoms.  Was going to get VQ scan but couldn't get done in hospital.  Feels a lot better.  When started taking pac, the tendon in her wrist is swollen, tender and sore. Diarrhea/loose stools x 2 days on pac.  HU 1500 today/HU 1000mg  tomorrow; alternating doses.      Review of Systems   10-systems otherwise reviewed and negative except as per Interval History.    Laboratory & Imaging Review   07/20/23 (reduced dose of HU due to anemia):  WBC 6.1, hgb 10.8, plts 1018  08/26/23 (on pacritinib x 11 days)  WBC 11.7, hgb 10.0, plts 1453  08/27/23: stopped pac, restarted HU  08/30/23:WBC 9.7, hgb 9.5, plts 1565    Assessment   #1 Essential thrombocytosis, CALR mutated  #2 Macrocytic anemia - transfusion-dependent again since 12/2022  #3 s/p renal transplant - etiology of renal failure is unclear  #4 CKD, eGFR 71ml/min - Now with CKD after kidney transplant in 2014.  NO LONGER ON DIALYSIS.  #5 Pulmonary HTN - severe pulm HTN noted on 08/19/23 echo, previously noted to be mild-moderate (01/2023).  Followed by Dr. Ala Dach    Since her last visit, Ms. Openshaw has started and discontinued pacritinib and restarted hydroxyurea.  She was seen by Nephrology for routine follow up on 2/14.  She was having a worsening cough and flank pain and was sent to the ER for evaluation with concern for PE given platelets >1500k.  Fortunately, no PE was found and patient was discharged with a VQ scan pending as an outpatient.  She contacted our team day after discharge reporting that she felt like she may be very anemic given SOB, fatigue, and feeling like a heavy weight on me.  She was urgently brought in for labs and transfusion.  CBC showed a hgb of 9.5.  We transfused her 1u pRBCs due to symptoms.    Today, she feels much better after the 1u.  On review of her labs, her EPO was drawn on Friday as well.  In November, it was 128, and now it is 50.3.  Initially I was considering this was from holding Jardiance during hospitalization, however it appears this was drawn prior to hospitalization.  We had tried to get an ESA for her previously, but she was hesitant to start on Procrit which was what was approved by insurance.  She prefers Aranesp.  We also thought that pacritinib would be beneficial in raising her hgb due to ACVR1 inhibition while a known side effect can be thrombocytopenia.  Unfortunately, it does not seem that the abrupt switch was appropriate.  Would be better to cross taper to continue control of platelets while improving hgb.  Discussed this with Ms. Vega and for now, she would like to continue with HU + ESA until things are stable. This is reasonable.  Sent in Rx for darbepoetin yesterday - this will be available to her her at $0 copay as she has already met her $2000 cap.  [facility] will begin onboarding for this.    In terms of side effects from pacritinib, it seems she did okay.  She does report some diarrhea, but as noted before, her bowels seem to cycle from constipation to diarrhea.  She states that this was not any worse than usual and lasted about 2 days.    Plan   Continue hydroxyurea alternating doses: 1500mg /1000mg   Continue ASA 81mg  PO daily  Labs in 2 weeks at [facility] or [facility]  Follow up in 6 weeks in clinic  Start Aranesp subcutaneous q28 days      [name], RN, MSN, AGPCNP-C  Nurse Practitioner  Hematologic Malignancies  [facility]  08/31/2023    I personally spent 65 minutes face-to-face and non-face-to-face in the care of this patient, which includes all pre, intra, and post visit time on the date of service. This time was spent in reviewing notes, labs and other test results in the chart, speaking with and examining the patient, communicating with other medical teams and documentation of the clinical encounter. All documented time was specific to the E/M visit and does not include any procedures that may have been performed.      History of Present Illness     Tahiry Spicer is a 74 y.o. woman with a history of chronic anemia, AVMs, thrombocytosis, ESRD s/p renal transplant (04/2013), hypertension, HFpEF, OSA, who presents for evaluation of chronic anemia in the setting of long-standing history of essential thrombocytosis.    She reports being initially diagnosed with thrombocytosis around 2004. At that time she was noted to have a platelet count > 2 million. This was incidentally noted on routine CBCs, and she was asymptomatic at the time. She had a bone marrow biopsy done (records not available to [place]). She  was treated with anegrelide by a hematologist in [place], [place],  for many years which she did not tolerate it. She does not recall the side effects.      In 2011 she established with [facility] (Dr. Marcheta Grammes). She was switched to hydroxyurea. She reports a history of CKD dating back at least to 2007. The etiology of her CKD was unclear. She underwent a renal transplant in 2014. Last year, her dose of hydroxyurea was increased and she developed worsening anemia. Evaluation of her anemia was remarkable for a borderline low vitamin B12 level, and an IgA kappa monoclonal gammopathy. She was treated with Aranesp monthly injection but reportedly the dose was too low (40 mcg/monthly) for many months and only recently increased to the appropriate level (200 mcg/monthly). She has not required any transfusion since October/November of last year. She completed four weekly doses of vitamin B12 injections.       Platelet history:        Hemoglobin history:        Hematology/Oncology History   Essential thrombocytosis (CMS-HCC)   05/13/2003 Initial Diagnosis    Diagnosis = Essential thrombocythemia, 05/2003.  Dr. Sherrlyn Hock.   During a well-woman evaluation, patient was noted to have incidental thrombocytosis, reportedly to 2.2 million.  She was started on hydroxyurea + anagrelide and in ~2007 she developed anemia with low haptoglobin with concern for hemolysis though no clear source was found.  She was ultimately found to have multiple small bowel AVMs and required IV iron replacement and blood transfusions to manage.  She had genetic HHT evaluation due to the AVMs at [facility] which was negative in 2007.      Bone marrow biopsy = not done    Driver mutation = Not known.  JAK2 [id number]-negative  Additional Myeloid Mutations = Not known.   Karyotype = Not known.     Thrombosis Risk =      05/13/2003 -  Chemotherapy    HYDROXYUREA + ANAGRELIDE  - started due to platelets of 2.2 million.    - unclear when stopped anagrelide     07/12/2012 -  Chemotherapy    HYDROXYUREA  Dropped to single agent hydroxyurea  (+) transfusion-dependent anemia, received pRBCs on:  --> 05/06/20 (2)  --> 06/09/20 (2)  Started on darbepoietin alfa (Aranesp) on 05/06/20    08/01/2020: WHOLE BODY PET-CT  - Mildly hypermetabolic right middle lobe nodule in the lateral segment as described above. Cannot exclude a low metabolic rate malignancy.      -Kidney transplant is noted in the left pelvis without abnormality noted. No obstruction is detected.      -Mild to moderate cardiomegaly is noted with small to moderate size pericardial effusion. This was not noted on preceding exams.     08/26/20: 1000mg  daily alternating with 500mg  daily  WBC 4.3. ANC 3.6. Hgb 9.8. MCV 124. Plts 542.     09/29/2020: Seen by Porfirio Oar, MD - [facility] (first visit)    [facility] 10/08/2020:  Markedly hypercellular bone marrow (70%) with features compatible with reported history of Essential Thrombocytosis and 4% blasts by manual aspirate differential (see Comment)  -  Mild and focally moderate reticulin fibrosis (MF1-2).  - Driver Mutation = CALR, Type 1. VAF 36%   - Additional Myeloid Mutations = not detected.   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 anc 3.7. Hgb 10.0. MCV 124. Plts 681.     11/09/2021: Seen by B. Anise Salvo in follow  up.   She continues on hydroyxurea, now back to 1000mg  po daily. She completed radiation therapy for lung cancer. Although she had been transfusion-dependent of red blood cells not responsive to ESAs and there was consideration for danazol, since starting Jardiance (SGL2 inhibitor) for diabetes, her hemoglobin has increased and she has not required a blood transfusion since 05/11/2021.   WBC 4.7 with normal differential save for lymphopenia (ALC 0.3), hgb 10.7 g/dL with MCV 347 (from hydroxyurea), and platelets well-controlled at 552     04/13/2022: Seen by Shaune Pascal in follow up.   Continues on hydroxyurea 500mg  po BID (16 tablets/week)  Hgb 11.6 g/dL.  No transfusions.     09/02/2022: Hgb 12.0 g/dL  11/03/93: Hgb 63.8 g/dL  7/56/43: Hgb 8.9 g/dL  --> 10/07/49: 1U PRBC  02/23/23: Hgb 9.9 g/dL  --> 8/84/16: 1U PRBC  03/07/23: hgb 7.3 g/dL  --> 12/15/28: 2U PRBC  03/23/23: seen by Suezanne Cheshire in follow up.   --> continue HU 500mg  po BID (16 pills/wk)  03/28/23: Hgb 9.7 g/L.  Plts 1091.   --> increased HU to 17 pills/wk per Dr. Marcheta Grammes  05/09/23: Hgb 9.2 g/dL.  Plts 708. WBC 3.2.      05/09/2023 Biopsy    BONE MARROW BIOPSY  Indication = worsening anemia  @[facility]     -  Hypercellular bone marrow (60%) with features compatible with reported history of Essential Thrombocytosis and 3% blasts by manual aspirate differential (see Comment)  -  Mild reticulin fibrosis and no significant collagen fibrosis (MF-1)    Peripheral blood:   WBC 3.2, ANC 2.6, Hgb 9.2, MCV 1259, Plts 708.   No leukoerythroblastsosis    No LDH from 05/09/23, but from 03/08/23: 365 (ULN 246).      08/12/2023 -  Chemotherapy    PACRITINIB    Indication = post-ET MF with substantial anemia on hydroxyurea while simultaneously has elevated platelets to 1018.  Ruxolitinib not used given anemia.  Momelotinib considered, but with the post-marketing reports of 20% renal injury, elected for pacritinib instead.     12-lead EKG ORDERED 07/26/23 as baseline before starting pacritinib. It has not yet been completed.                Medications     Current Outpatient Medications:     acetaminophen (TYLENOL 8 HOUR) 650 MG CR tablet, Take 1 tablet (650 mg total) by mouth nightly., Disp: , Rfl:     albuterol HFA 90 mcg/actuation inhaler, Inhale 2 puffs every six (6) hours as needed for wheezing or shortness of breath., Disp: 1 Inhaler, Rfl: 0    allopurinol (ZYLOPRIM) 100 MG tablet, TAKE 2 TABLETS BY MOUTH IN THE MORNING, Disp: 180 tablet, Rfl: 0    amlodipine (NORVASC) 5 MG tablet, Take 1 tablet by mouth once daily, Disp: 90 tablet, Rfl: 0    aspirin (ECOTRIN) 81 MG tablet, Take 1 tablet (81 mg total) by mouth daily., Disp: , Rfl:     carvedilol (COREG) 25 MG tablet, Take 1 tablet by mouth twice daily, Disp: 180 tablet, Rfl: 0    cephalexin (KEFLEX) 250 MG capsule, Take 1 capsule by mouth once daily, Disp: 90 capsule, Rfl: 0    cholecalciferol, vitamin D3 25 mcg, 1,000 units,, 1,000 unit (25 mcg) tablet, Take 1 tablet (25 mcg total) by mouth daily. (Patient taking differently: Take 1 tablet (25 mcg total) by mouth two (2) times a day.), Disp: 90 tablet, Rfl: 3    colchicine (COLCRYS) 0.6  mg tablet, Take 1 tablet (0.6 mg total) by mouth two (2) times a day as needed. For acute gout, take 2 tablets by mouth, then 1 tablet one hour later on first day. May continue 1 tablet up to twice a day as needed., Disp: , Rfl:     darbepoetin alfa-polysorbate (ARANESP) 300 mcg/0.6 mL Syrg, Inject 0.6 mL (300 mcg total) under the skin every twenty-eight (28) days., Disp: 0.6 mL, Rfl: 11    eplerenone (INSPRA) 25 MG tablet, Take 1 tablet (25 mg total) by mouth daily., Disp: , Rfl:     fluticasone propionate (FLONASE) 50 mcg/actuation nasal spray, Use 1 spray(s) in each nostril twice daily (Patient taking differently: 1 spray into each nostril daily as needed.), Disp: 16 g, Rfl: 11    furosemide (LASIX) 40 MG tablet, Take 1 tablet (40 mg total) by mouth two (2) times a day. (Patient taking differently: Take 2 tablets (80 mg total) by mouth daily.), Disp: 180 tablet, Rfl: 3    glucosamine-chondroitin 500-400 mg tablet, Take 1 tablet by mouth Two (2) times a day., Disp: , Rfl:     hydroxyurea (HYDREA) 500 mg capsule, Take 2 capsules (1,000 mg total) by mouth every other day., Disp: , Rfl:     hydroxyurea (HYDREA) 500 mg capsule, Take 3 capsules (1,500 mg total) by mouth every other day. Take 3 capsules (1500 mg total) every other day, with 2 capsules (1000 mg total) on the other days., Disp: 90 capsule, Rfl: 0    losartan (COZAAR) 50 MG tablet, Take 0.5 tablets (25 mg total) by mouth daily. Take 1/2 of usual tablet, for total of 25 mg daily, until you follow up with your nephrologist and are told to increase back to your usual dosing, Disp: , Rfl:     MEDICAL SUPPLY ITEM, 14 cm into each nostril nightly. CPAP Auto titrating machine 14cmH2O 3PR  [employer] is patient's DME provider., Disp: , Rfl:     melatonin 10 mg Tab, Take 1 tablet (10 mg total) by mouth nightly., Disp: , Rfl:     MYFORTIC 180 mg EC tablet, Take 2 tablets (360 mg total) by mouth two (2) times a day., Disp: 120 tablet, Rfl: 11    OMEGA-3/DHA/EPA/FISH OIL (FISH OIL-OMEGA-3 FATTY ACIDS) 300-1,000 mg capsule, Take 1 capsule (1 g total) by mouth daily., Disp: , Rfl:     pantoprazole (PROTONIX) 40 MG tablet, Take 1 tablet by mouth once daily, Disp: 90 tablet, Rfl: 0    pravastatin (PRAVACHOL) 40 MG tablet, Take 1 tablet (40 mg total) by mouth daily., Disp: 90 tablet, Rfl: 3    PROGRAF 0.5 mg capsule, Take 2 capsules (1 mg total) by mouth in the morning. Take 1 mg in morning and 0.5 mg in evening., Disp: , Rfl:     sodium bicarbonate 650 mg tablet, Take 1 tablet (650 mg total) by mouth two (2) times a day. NEW DOSE, Disp: , Rfl:     tacrolimus (PROGRAF) 0.5 MG capsule, Take 1 capsule (0.5 mg total) by mouth nightly. Take 1 mg in morning and 0.5 mg in evening, Disp: , Rfl:     umeclidinium (INCRUSE ELLIPTA) 62.5 mcg/actuation inhaler, Inhale 1 puff by mouth once daily, Disp: 90 each, Rfl: 3    zolpidem (AMBIEN) 5 MG tablet, Take 1 tablet (5 mg total) by mouth nightly as needed., Disp: 30 tablet, Rfl: 3    [Paused] empagliflozin (JARDIANCE) 10 mg tablet, Take 1 tablet by mouth once daily (Patient not  taking: Reported on 08/31/2023), Disp: 90 tablet, Rfl: 3    [Paused] metFORMIN (GLUCOPHAGE) 500 MG tablet, Take 1 tablet (500 mg total) by mouth in the morning and 1 tablet (500 mg total) in the evening. Take with meals. (Patient not taking: Reported on 08/31/2023), Disp: 180 tablet, Rfl: 3    Allergies     Allergies   Allergen Reactions    Macrobid [Nitrofurantoin Monohyd/M-Cryst] Other (See Comments)     Duplicate    Nitrofurantoin Nausea And Vomiting       Past Medical and Surgical History     Past Medical History:   Diagnosis Date    Actinic keratosis     Acute injury of anterior cruciate ligament     Allergic Myfodic    Anemia, iron deficiency     AVM (arteriovenous malformation)     small bowel diagnosed by capsule camer    Basal cell carcinoma 2018    Cataract     Chronic kidney disease     Clotting disorder (CMS-HCC)     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    Diabetes 2 09/02/2022    Diastolic CHF (CMS-HCC)     RHC 10/2012: PCWP 25 MPAP 40 PVR 2.4    Dyspnea on exertion 02/07/2015    Essential thrombocytosis (CMS-HCC) 2005    Heart disease 07/12/2014    History of renal transplant 03/2013    complicated by delayed onset graft function    Hypertension     Inverted nipple 01/19/2021    bilateral and pt states always have been inverted    Joint pain     Obstructive sleep apnea     on home CPAP therapy    Osteoarthritis     Pulmonary edema 01/07/2013    Pulmonary hypertension (CMS-HCC)     Squamous cell skin cancer      Past Surgical History:   Procedure Laterality Date    HAND SURGERY      PR CYSTOSCOPY,REMV CALCULUS,SIMPLE  05/15/2013    Procedure: CYSTOURETHROSCOPY, WITH REMOVAL OF FOREIGN BODY, CALCULUS OR URETERAL STENT FROM URETHRA OR BLADDER; SIMPLE;  Surgeon: Randalyn Rhea, MD;  Location: MAIN OR [place];  Service: Urology    PR REMOVAL TUNNELED INTRAPERITONEAL CATHETER N/A 05/15/2013    Procedure: REMOVAL OF PERMANENT INTRAPERITONEAL CANNULA OR CATHETER;  Surgeon: Purcell Mouton, MD;  Location: MAIN OR [facility];  Service: Transplant    PR RIGHT HEART CATH O2 SATURATION & CARDIAC OUTPUT N/A 03/24/2015    Procedure: Right Heart Catheterization;  Surgeon: Mittie Bodo, MD;  Location: [facility] CATH;  Service: Cardiology    PR TRANSPLANT,PREP CADAVER RENAL GRAFT N/A 04/04/2013    Procedure: [facility] STD PREP CAD DONR RENAL ALLOGFT PRIOR TO TRNSPLNT, INCL DISSEC/REM PERINEPH FAT, DIAPH/RTPER ATTAC;  Surgeon: Vivi Barrack, MD;  Location: MAIN OR [place];  Service: Transplant    PR TRANSPLANTATION OF KIDNEY N/A 04/04/2013    Procedure: RENAL ALLOTRANSPLANTATION, IMPLANTATION OF GRAFT; WITHOUT RECIPIENT NEPHRECTOMY;  Surgeon: Vivi Barrack, MD;  Location: MAIN OR [place];  Service: Transplant    SKIN BIOPSY      knee and back        Social History     Social History     Socioeconomic History    Marital status: Married    Number of children: 3   Occupational History    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 Yes    Tanning bed use? No    Are you easily burned? Yes    Excessive sun exposure? No    Blistering sunburns? No   Social History Narrative    Lives in one-level home in [place] with husband Marilu Favre) x 53 years.     Has 3 children (Buddy, Billy, Brad).     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     Current hobbies include: domestic travel, spending time with grandkids         ADLs:    Feeding: Independent    Dressing: Independent    Ambulation: Independent    Toileting: Independent    Bathing: Independent        IADLs:    Using the telephone: Independent    Shopping: Independent    Meal preparation: Independent    Medication management: Independent    Managing finances: Independent    Housework: Dependent    Transportation (driving or navigating public transit): Independent         Assistive devices: electric W/C for longer distances;  Has quad cane          ----        Lives at home with husband. Independent, drives. Has 3 adult children. Former smoker. Rare alcohol use. No recreational drugs.      Social Drivers of Psychologist, prison and probation services Strain: Low Risk  (03/03/2023)    Overall Financial Resource Strain (CARDIA)     Difficulty of Paying Living Expenses: Not hard at all   Food Insecurity: No Food Insecurity (03/03/2023)    Hunger Vital Sign     Worried About Running Out of Food in the Last Year: Never true     Ran Out of Food in the Last Year: Never true   Transportation Needs: No Transportation Needs (03/03/2023)    PRAPARE - Therapist, art (Medical): No     Lack of Transportation (Non-Medical): No       Family History   Father had MI  4 siblings  No family history of blood cancers or disorders.    Physical Examination     There were no vitals filed for this visit.          GENERAL:  Well-appearing and in no apparent distress.  No apparent dysmorphia.  Accompanied by her husband.  Nasal cannula in place.   HEENT:  Anicteric sclera.    CV:  RRR.  +II/VI murmur heard best at RUSB.  RESP:  Lung bases are clear. Speaking comfortably in full sentences.   EXTREMITIES:  No clubbing, cyanosis, nor edema.  NEURO:  AO X 4.   SKIN: No petechiae nor purpura.  No rashes.   PSYCH: Affect is full and congruent.  No psychomotor agitation nor retardation.  Thought process is goal-directed and linear.      Laboratory Testing and Imaging     Results for orders placed or performed during the hospital encounter of 08/30/23   Ferritin   Result Value Ref Range    Ferritin 1,066.1 (H) 7.3 - 270.7 ng/mL   Iron & TIBC   Result Value Ref Range    Iron 36 (L) 50 - 170 ug/dL    TIBC 161 (L) 096 - 045 ug/dL    Iron Saturation (%) 20 20 - 55 %   Prepare RBC   Result Value Ref Range    PRODUCT CODE E0332V00     Product ID Red Blood Cells     Spec Expiration [id number]     Status Ready for issue     Unit # [id number]     Unit Blood Type A Pos     ISBT Number 6200     Crossmatch Compatible     PRODUCT CODE E0332V00     Product ID Red Blood Cells     Spec Expiration [id number]     Status Transfused     Unit # [id number]     Unit Blood Type A Pos     ISBT Number [id number]     Crossmatch Compatible      *Note: Due to a large number of results and/or encounters for the requested time period, some results have not been displayed. A complete set of results can be found in Results Review.

## 2023-08-31 NOTE — Unmapped (Signed)
 Pt confirmed MyChart visit and meds

## 2023-09-01 NOTE — Unmapped (Signed)
 [facility]    Patient Onboarding/Medication Counseling    Erin Welch is a 73 y.o. female with Anemia of chronic renal failure who I am counseling today on continuation of therapy.  I am speaking to the patient.    Was a Nurse, learning disability used for this call? No    Verified patient's date of birth / HIPAA.    Specialty medication(s) to be sent: Hematology/Oncology: Aranesp      Non-specialty medications/supplies to be sent: n/a      Medications not needed at this time: n/a       The patient declined counseling on medication administration, missed dose instructions, goals of therapy, side effects and monitoring parameters, warnings and precautions, drug/food interactions, and storage, handling precautions, and disposal because they have taken the medication previously. The information in the declined sections below are for informational purposes only and was not discussed with patient.     Aranesp (Darbepoetin Alfa)    Medication & Administration     Dosage:   1 syringe (0.49ml) under skin every 28 days    Administration:   Remove one prefilled syringe from refrigerator and take remove syringe from the tray by holding the yellow safety guard  Check for damage, expiration date, color (clear and colorless with no flakes/particles)  Gather injection materials (Syringe, alcohol swabs, cotton balls, band aid, sharps container) and then wash hands with soap and warm water  Prepare and clean injection site (thigh or stomach, can use buttocks or upper arm if someone else is administering the injection) with alcohol swab and let air dry  Hold the syringe by the syringe barrel and pull the gray needle cap straight off and away from the body and throw it away in the sharps container  Check for dose quantity (if full syringe then proceed to next step) if partial dose  Point needle up and tap gently until air rises to top  Slowly push plunger rod up to the line on the syringe barrel that matches dose prescribed  Pinch skin at injection site to create a firm surface  Hold pinched skin and insert the needle into the skin at a 45 or 90 degree angle  Slowly push the plunger rod until it reached the bottom  Pull needle out of your skin and pull the yellow safety guard until it clicks and covers the needle  Throw away the used syringe in the sharps container    Adherence/Missed dose instructions:  Call your provider    Goals of Therapy   To treat anemia    Side Effects & Monitoring Parameters   Common side effects:  Stomach pain  Cough    The following side effects should be reported to the provider:  Allergic reaction  High blood pressure (bad headache, dizziness, change in eyesight)  Fast heartbeat   Shortness of breath, big weight gain, or swelling in arms/legs  Weakness on 1 side of the body (trouble speaking, thinking, balancing)  Cool or pale arm or leg  Dizziness, passing out, sweating a lot  Seizures  Signs of red, swollen, blistered, or peeling skin (with or without fever), red or irritated eyes, or sores in your mouth, throat, nose, or eyes    Monitoring Parameters:  Blood work (CBC)   Blood pressure    Contraindications, Warnings, & Precautions   Black box warning: May raise chance of heart attack, stroke, blood clots, and death.  People with some types of cancer have died sooner when using  this drug. This drug also raised the chance of tumor growth and the tumor happening again in these people.    Drug/Food Interactions     Medication list reviewed in Epic. The patient was instructed to inform the care team before taking any new medications or supplements. No drug interactions identified.   List any food/vaccine restrictions here.     Storage, Handling Precautions, & Disposal   Store in refrigerator in original packaging  Keep away from children and pets    Current Medications (including OTC/herbals), Comorbidities and Allergies     Current Outpatient Medications   Medication Sig Dispense Refill acetaminophen (TYLENOL 8 HOUR) 650 MG CR tablet Take 1 tablet (650 mg total) by mouth nightly.      albuterol HFA 90 mcg/actuation inhaler Inhale 2 puffs every six (6) hours as needed for wheezing or shortness of breath. 1 Inhaler 0    allopurinol (ZYLOPRIM) 100 MG tablet TAKE 2 TABLETS BY MOUTH IN THE MORNING 180 tablet 0    amlodipine (NORVASC) 5 MG tablet Take 1 tablet by mouth once daily 90 tablet 0    aspirin (ECOTRIN) 81 MG tablet Take 1 tablet (81 mg total) by mouth daily.      carvedilol (COREG) 25 MG tablet Take 1 tablet by mouth twice daily 180 tablet 0    cephalexin (KEFLEX) 250 MG capsule Take 1 capsule by mouth once daily 90 capsule 0    cholecalciferol, vitamin D3 25 mcg, 1,000 units,, 1,000 unit (25 mcg) tablet Take 1 tablet (25 mcg total) by mouth daily. (Patient taking differently: Take 1 tablet (25 mcg total) by mouth two (2) times a day.) 90 tablet 3    colchicine (COLCRYS) 0.6 mg tablet Take 1 tablet (0.6 mg total) by mouth two (2) times a day as needed. For acute gout, take 2 tablets by mouth, then 1 tablet one hour later on first day. May continue 1 tablet up to twice a day as needed.      darbepoetin alfa-polysorbate (ARANESP) 300 mcg/0.6 mL Syrg Inject 0.6 mL (300 mcg total) under the skin every twenty-eight (28) days. 0.6 mL 11    [Paused] empagliflozin (JARDIANCE) 10 mg tablet Take 1 tablet by mouth once daily (Patient not taking: Reported on 08/31/2023) 90 tablet 3    eplerenone (INSPRA) 25 MG tablet Take 1 tablet (25 mg total) by mouth daily.      fluticasone propionate (FLONASE) 50 mcg/actuation nasal spray Use 1 spray(s) in each nostril twice daily (Patient taking differently: 1 spray into each nostril daily as needed.) 16 g 11    furosemide (LASIX) 40 MG tablet Take 1 tablet (40 mg total) by mouth two (2) times a day. (Patient taking differently: Take 2 tablets (80 mg total) by mouth daily.) 180 tablet 3    glucosamine-chondroitin 500-400 mg tablet Take 1 tablet by mouth Two (2) times a day.      hydroxyurea (HYDREA) 500 mg capsule Take 2 capsules (1,000 mg total) by mouth every other day.      hydroxyurea (HYDREA) 500 mg capsule Take 3 capsules (1,500 mg total) by mouth every other day. Take 3 capsules (1500 mg total) every other day, with 2 capsules (1000 mg total) on the other days. 90 capsule 0    losartan (COZAAR) 50 MG tablet Take 0.5 tablets (25 mg total) by mouth daily. Take 1/2 of usual tablet, for total of 25 mg daily, until you follow up with your nephrologist and are told to increase back  to your usual dosing      MEDICAL SUPPLY ITEM 14 cm into each nostril nightly. CPAP Auto titrating machine 14cmH2O 3PR  [employer] is patient's DME provider.      melatonin 10 mg Tab Take 1 tablet (10 mg total) by mouth nightly.      [Paused] metFORMIN (GLUCOPHAGE) 500 MG tablet Take 1 tablet (500 mg total) by mouth in the morning and 1 tablet (500 mg total) in the evening. Take with meals. (Patient not taking: Reported on 08/31/2023) 180 tablet 3    MYFORTIC 180 mg EC tablet Take 2 tablets (360 mg total) by mouth two (2) times a day. 120 tablet 11    OMEGA-3/DHA/EPA/FISH OIL (FISH OIL-OMEGA-3 FATTY ACIDS) 300-1,000 mg capsule Take 1 capsule (1 g total) by mouth daily.      pantoprazole (PROTONIX) 40 MG tablet Take 1 tablet by mouth once daily 90 tablet 0    pravastatin (PRAVACHOL) 40 MG tablet Take 1 tablet (40 mg total) by mouth daily. 90 tablet 3    PROGRAF 0.5 mg capsule Take 2 capsules (1 mg total) by mouth in the morning. Take 1 mg in morning and 0.5 mg in evening.      sodium bicarbonate 650 mg tablet Take 1 tablet (650 mg total) by mouth two (2) times a day. NEW DOSE      tacrolimus (PROGRAF) 0.5 MG capsule Take 1 capsule (0.5 mg total) by mouth nightly. Take 1 mg in morning and 0.5 mg in evening      umeclidinium (INCRUSE ELLIPTA) 62.5 mcg/actuation inhaler Inhale 1 puff by mouth once daily 90 each 3    zolpidem (AMBIEN) 5 MG tablet Take 1 tablet (5 mg total) by mouth nightly as needed. 30 tablet 3 No current facility-administered medications for this visit.       Allergies   Allergen Reactions    Macrobid [Nitrofurantoin Monohyd/M-Cryst] Other (See Comments)     Duplicate    Nitrofurantoin Nausea And Vomiting       Patient Active Problem List   Diagnosis    Diarrhea    Heart failure with preserved left ventricular function (HFpEF) (CMS-HCC)    Essential thrombocytosis (CMS-HCC)    Anemia of chronic kidney failure    AKI (acute kidney injury) (CMS-HCC)    Aftercare following organ transplant    Hypertension    Post-traumatic osteoarthritis of knee    Pulmonary hypertension (CMS-HCC)    Renal transplant, status post    GERD (gastroesophageal reflux disease)    OSA on CPAP    Gout    Recurrent UTI    Anemia of chronic renal failure, stage 3 (moderate) (CMS-HCC)    AVM (arteriovenous malformation) of small bowel, acquired    Immunosuppressed status (CMS-HCC)    History of nonmelanoma skin cancer    Lung nodule    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)    COPD    Other specified disorders of adrenal gland (CMS-HCC)    Insomnia due to medical condition    Dehydration    Hypokalemia    Hypomagnesemia    Metabolic acidosis       Medication list has been reviewed and updated in Epic: Yes    Allergies have been reviewed and updated in Epic: Yes    Appropriateness of Therapy     Acute infections noted within Epic:  No active infections  Patient reported infection: None    Is the medication and dose appropriate based on diagnosis,  medication list, comorbidities, allergies, medical history, patient???s ability to self-administer the medication, and therapeutic goals? Yes    Prescription has been clinically reviewed: Yes      Baseline Quality of Life Assessment      How many days over the past month did your condition  keep you from your normal activities? For example, brushing your teeth or getting up in the morning. 0    Financial Information     Medication Assistance provided: None Required    Anticipated copay of $0 reviewed with patient. Verified delivery address.    Delivery Information     Scheduled delivery date: 09/07/23    Expected start date: TBD based on labwork      Medication will be delivered via Same Day Courier to the prescription address in [facility].  This shipment will not require a signature.      Explained the services we provide at [facility] and that each month we would call to set up refills.  Stressed importance of returning phone calls so that we could ensure they receive their medications in time each month.  Informed patient that we should be setting up refills 7-10 days prior to when they will run out of medication.  A pharmacist will reach out to perform a clinical assessment periodically.  Informed patient that a welcome packet, containing information about our pharmacy and other support services, a Notice of Privacy Practices, and a drug information handout will be sent.      The patient or caregiver noted above participated in the development of this care plan and knows that they can request review of or adjustments to the care plan at any time.      Patient or caregiver verbalized understanding of the above information as well as how to contact the pharmacy at [phone] option 4 with any questions/concerns.  The pharmacy is open Monday through Friday 8:30am-4:30pm.  A pharmacist is available 24/7 via pager to answer any clinical questions they may have.    Patient Specific Needs     Does the patient have any physical, cognitive, or cultural barriers? No    Does the patient have adequate living arrangements? (i.e. the ability to store and take their medication appropriately) Yes    Did you identify any home environmental safety or security hazards? No    Patient prefers to have medications discussed with  Patient     Is the patient or caregiver able to read and understand education materials at a high school level or above? Yes    Patient's primary language is  English     Is the patient high risk? No    Does the patient have an additional or emergency contact listed in their chart? Yes    SOCIAL DETERMINANTS OF HEALTH     At the [facility], we have learned that life circumstances - like trouble affording food, housing, utilities, or transportation can affect the health of many of our patients.   That is why we wanted to ask: are you currently experiencing any life circumstances that are negatively impacting your health and/or quality of life? No    Social Drivers of Health     Food Insecurity: No Food Insecurity (03/03/2023)    Hunger Vital Sign     Worried About Running Out of Food in the Last Year: Never true     Ran Out of Food in the Last Year: Never true   Internet Connectivity: Not  on file   Housing/Utilities: Low Risk  (03/03/2023)    Housing/Utilities     Within the past 12 months, have you ever stayed: outside, in a car, in a tent, in an overnight shelter, or temporarily in someone else's home (i.e. couch-surfing)?: No     Are you worried about losing your housing?: No     Within the past 12 months, have you been unable to get utilities (heat, electricity) when it was really needed?: No   Tobacco Use: Medium Risk (08/12/2023)    Patient History     Smoking Tobacco Use: Former     Smokeless Tobacco Use: Never     Passive Exposure: Not on file   Transportation Needs: No Transportation Needs (03/03/2023)    PRAPARE - Transportation     Lack of Transportation (Medical): No     Lack of Transportation (Non-Medical): No   Alcohol Use: Not on file   Interpersonal Safety: Not on file   Physical Activity: Not on file   Intimate Partner Violence: Not on file   Stress: Not on file   Substance Use: Not on file (05/16/2023)   Social Connections: Not on file   Financial Resource Strain: Low Risk  (03/03/2023)    Overall Financial Resource Strain (CARDIA)     Difficulty of Paying Living Expenses: Not hard at all   Depression: Not at risk (04/14/2023)    PHQ-2     PHQ-2 Score: 0   Health Literacy: Not on file       Would you be willing to receive help with any of the needs that you have identified today? Not applicable       Sallie Staron Vangie Bicker, PharmD  [facility] Specialty Pharmacist

## 2023-09-05 ENCOUNTER — Inpatient Hospital Stay: Admit: 2023-09-05 | Discharge: 2023-09-07 | Payer: MEDICARE

## 2023-09-05 MED ADMIN — Tc-99m Macroagragated Albumin (MAA): 4.4 | INTRAVENOUS | @ 18:00:00 | Stop: 2023-09-05

## 2023-09-05 MED ADMIN — Xenon Xe-133 Gas: 10.5 | RESPIRATORY_TRACT | @ 18:00:00 | Stop: 2023-09-05

## 2023-09-05 NOTE — Unmapped (Addendum)
 VENOUS ACCESS TEAM PROCEDURE    Nurse request was placed for a PIV by Venous Access Team (VAT).  Patient was assessed at bedside for placement of a PIV. PPE were donned per protocol.  Access was obtained. Blood return noted.  Dressing intact and device well secured.  Flushed with normal saline.  See LDA for details.  Pt advised to inform RN of any s/s of discomfort at the PIV site.    Workup / Procedure Time:  15 minutes       Primary RN was notified.       Thank you,     Shari Prows, RN Venous Access Team

## 2023-09-06 LAB — HLA DS POST TRANSPLANT
ANTI-DONOR DRW #1 MFI: 0 MFI
ANTI-DONOR HLA-A #1 MFI: 0 MFI
ANTI-DONOR HLA-B #1 MFI: 0 MFI
ANTI-DONOR HLA-B #2 MFI: 0 MFI
ANTI-DONOR HLA-C #1 MFI: 0 MFI
ANTI-DONOR HLA-C #2 MFI: 0 MFI
ANTI-DONOR HLA-DQB #1 MFI: 18 MFI
ANTI-DONOR HLA-DQB #2 MFI: 9 MFI
ANTI-DONOR HLA-DR #1 MFI: 0 MFI
ANTI-DONOR HLA-DR #2 MFI: 0 MFI

## 2023-09-06 LAB — FSAB CLASS 1 ANTIBODY SPECIFICITY: HLA CLASS 1 ANTIBODY RESULT: POSITIVE

## 2023-09-06 LAB — FSAB CLASS 2 ANTIBODY SPECIFICITY: HLA CL2 AB RESULT: POSITIVE

## 2023-09-07 MED FILL — EMPTY CONTAINER: 30 days supply | Qty: 1 | Fill #0

## 2023-09-11 DIAGNOSIS — I10 Essential (primary) hypertension: Principal | ICD-10-CM

## 2023-09-11 MED ORDER — PRAVASTATIN 40 MG TABLET
ORAL_TABLET | Freq: Every day | ORAL | 0 refills | 0.00 days
Start: 2023-09-11 — End: ?

## 2023-09-12 DIAGNOSIS — Z940 Kidney transplant status: Principal | ICD-10-CM

## 2023-09-12 MED ORDER — PANTOPRAZOLE 40 MG TABLET,DELAYED RELEASE
ORAL_TABLET | Freq: Every day | ORAL | 0 refills | 90.00 days | Status: CP
Start: 2023-09-12 — End: ?

## 2023-09-12 MED ORDER — CARVEDILOL 25 MG TABLET
ORAL_TABLET | ORAL | 0 refills | 0.00 days | Status: CP
Start: 2023-09-12 — End: ?

## 2023-09-12 MED ORDER — PRAVASTATIN 40 MG TABLET
ORAL_TABLET | Freq: Every day | ORAL | 3 refills | 90.00 days | Status: CP
Start: 2023-09-12 — End: ?

## 2023-09-12 MED ORDER — CEPHALEXIN 250 MG CAPSULE
ORAL_CAPSULE | Freq: Every day | ORAL | 3 refills | 90.00 days | Status: CP
Start: 2023-09-12 — End: ?

## 2023-09-12 MED ORDER — ALLOPURINOL 100 MG TABLET
ORAL_TABLET | 3 refills | 0.00 days | Status: CP
Start: 2023-09-12 — End: ?

## 2023-09-12 MED ORDER — AMLODIPINE 5 MG TABLET
ORAL_TABLET | Freq: Every day | ORAL | 0 refills | 90 days | Status: CP
Start: 2023-09-12 — End: ?

## 2023-09-12 MED ORDER — LOSARTAN 50 MG TABLET
ORAL_TABLET | Freq: Every day | ORAL | 0 refills | 90 days | Status: CP
Start: 2023-09-12 — End: ?

## 2023-09-19 ENCOUNTER — Ambulatory Visit: Admit: 2023-09-19 | Discharge: 2023-09-20 | Payer: MEDICARE

## 2023-09-19 LAB — COMPREHENSIVE METABOLIC PANEL
ALBUMIN: 3.8 g/dL (ref 3.4–5.0)
ALKALINE PHOSPHATASE: 53 U/L (ref 46–116)
ALT (SGPT): 7 U/L — ABNORMAL LOW (ref 10–49)
ANION GAP: 13 mmol/L (ref 5–14)
AST (SGOT): 14 U/L (ref <=34)
BILIRUBIN TOTAL: 0.5 mg/dL (ref 0.3–1.2)
BLOOD UREA NITROGEN: 68 mg/dL — ABNORMAL HIGH (ref 9–23)
BUN / CREAT RATIO: 34
CALCIUM: 9.8 mg/dL (ref 8.7–10.4)
CHLORIDE: 98 mmol/L (ref 98–107)
CO2: 27 mmol/L (ref 20.0–31.0)
CREATININE: 2.01 mg/dL — ABNORMAL HIGH (ref 0.55–1.02)
EGFR CKD-EPI (2021) FEMALE: 26 mL/min/{1.73_m2} — ABNORMAL LOW (ref >=60)
GLUCOSE RANDOM: 208 mg/dL — ABNORMAL HIGH (ref 70–99)
POTASSIUM: 3.9 mmol/L (ref 3.4–4.8)
PROTEIN TOTAL: 6.8 g/dL (ref 5.7–8.2)
SODIUM: 138 mmol/L (ref 135–145)

## 2023-09-19 LAB — CBC W/ AUTO DIFF
BASOPHILS ABSOLUTE COUNT: 0 10*9/L (ref 0.0–0.1)
BASOPHILS RELATIVE PERCENT: 0.8 %
EOSINOPHILS ABSOLUTE COUNT: 0.1 10*9/L (ref 0.0–0.5)
EOSINOPHILS RELATIVE PERCENT: 1.4 %
HEMATOCRIT: 32.9 % — ABNORMAL LOW (ref 34.0–44.0)
HEMOGLOBIN: 11.2 g/dL — ABNORMAL LOW (ref 11.3–14.9)
LYMPHOCYTES ABSOLUTE COUNT: 0.3 10*9/L — ABNORMAL LOW (ref 1.1–3.6)
LYMPHOCYTES RELATIVE PERCENT: 7.8 %
MEAN CORPUSCULAR HEMOGLOBIN CONC: 33.9 g/dL (ref 32.0–36.0)
MEAN CORPUSCULAR HEMOGLOBIN: 39.3 pg — ABNORMAL HIGH (ref 25.9–32.4)
MEAN CORPUSCULAR VOLUME: 116 fL — ABNORMAL HIGH (ref 77.6–95.7)
MEAN PLATELET VOLUME: 7.9 fL (ref 6.8–10.7)
MONOCYTES ABSOLUTE COUNT: 0.3 10*9/L (ref 0.3–0.8)
MONOCYTES RELATIVE PERCENT: 7.1 %
NEUTROPHILS ABSOLUTE COUNT: 3.2 10*9/L (ref 1.8–7.8)
NEUTROPHILS RELATIVE PERCENT: 82.9 %
PLATELET COUNT: 669 10*9/L — ABNORMAL HIGH (ref 150–450)
RED BLOOD CELL COUNT: 2.84 10*12/L — ABNORMAL LOW (ref 3.95–5.13)
RED CELL DISTRIBUTION WIDTH: 19 % — ABNORMAL HIGH (ref 12.2–15.2)
WBC ADJUSTED: 3.9 10*9/L (ref 3.6–11.2)

## 2023-09-19 LAB — MAGNESIUM: MAGNESIUM: 2.1 mg/dL (ref 1.6–2.6)

## 2023-09-19 LAB — SLIDE REVIEW

## 2023-09-19 LAB — LACTATE DEHYDROGENASE: LACTATE DEHYDROGENASE: 380 U/L — ABNORMAL HIGH (ref 120–246)

## 2023-09-19 LAB — PHOSPHORUS: PHOSPHORUS: 4 mg/dL (ref 2.4–5.1)

## 2023-09-20 LAB — TACROLIMUS LEVEL, TROUGH: TACROLIMUS, TROUGH: 2.5 ng/mL — ABNORMAL LOW (ref 5.0–15.0)

## 2023-09-23 NOTE — Unmapped (Signed)
 Pt called stating 3/17 appt not needed, Had transfusion on 09/19/23

## 2023-10-04 DIAGNOSIS — D631 Anemia in chronic kidney disease: Principal | ICD-10-CM

## 2023-10-04 DIAGNOSIS — N183 Anemia of chronic renal failure, stage 3 (moderate), unspecified whether stage 3a or 3b CKD (CMS-HCC): Principal | ICD-10-CM

## 2023-10-10 ENCOUNTER — Ambulatory Visit: Admit: 2023-10-10 | Discharge: 2023-10-10 | Attending: Adult Health | Primary: Adult Health

## 2023-10-10 ENCOUNTER — Ambulatory Visit: Admit: 2023-10-10 | Discharge: 2023-10-10 | Attending: Oncology | Primary: Oncology

## 2023-10-10 ENCOUNTER — Other Ambulatory Visit: Admit: 2023-10-10 | Discharge: 2023-10-10

## 2023-10-10 DIAGNOSIS — N183 Anemia of chronic renal failure, stage 3 (moderate), unspecified whether stage 3a or 3b CKD: Principal | ICD-10-CM

## 2023-10-10 DIAGNOSIS — D631 Anemia in chronic kidney disease: Principal | ICD-10-CM

## 2023-10-10 DIAGNOSIS — Z79899 Other long term (current) drug therapy: Principal | ICD-10-CM

## 2023-10-10 DIAGNOSIS — Z940 Kidney transplant status: Principal | ICD-10-CM

## 2023-10-10 DIAGNOSIS — D473 Essential (hemorrhagic) thrombocythemia: Principal | ICD-10-CM

## 2023-10-10 LAB — COMPREHENSIVE METABOLIC PANEL
ALBUMIN: 3.8 g/dL (ref 3.4–5.0)
ALKALINE PHOSPHATASE: 46 U/L (ref 46–116)
ALT (SGPT): <7 U/L — ABNORMAL LOW (ref 10–49)
ANION GAP: 12 mmol/L (ref 5–14)
AST (SGOT): 15 U/L (ref <=34)
BILIRUBIN TOTAL: 0.5 mg/dL (ref 0.3–1.2)
BLOOD UREA NITROGEN: 53 mg/dL — ABNORMAL HIGH (ref 9–23)
BUN / CREAT RATIO: 32
CALCIUM: 9.8 mg/dL (ref 8.7–10.4)
CHLORIDE: 106 mmol/L (ref 98–107)
CO2: 24 mmol/L (ref 20.0–31.0)
CREATININE: 1.67 mg/dL — ABNORMAL HIGH (ref 0.55–1.02)
EGFR CKD-EPI (2021) FEMALE: 32 mL/min/1.73m2 — ABNORMAL LOW (ref >=60)
GLUCOSE RANDOM: 231 mg/dL — ABNORMAL HIGH (ref 70–179)
POTASSIUM: 4.2 mmol/L (ref 3.4–4.8)
PROTEIN TOTAL: 6.7 g/dL (ref 5.7–8.2)
SODIUM: 142 mmol/L (ref 135–145)

## 2023-10-10 LAB — RENAL FUNCTION PANEL
ALBUMIN: 3.8 g/dL (ref 3.4–5.0)
ANION GAP: 12 mmol/L (ref 5–14)
BLOOD UREA NITROGEN: 53 mg/dL — ABNORMAL HIGH (ref 9–23)
BUN / CREAT RATIO: 31
CALCIUM: 10.3 mg/dL (ref 8.7–10.4)
CHLORIDE: 109 mmol/L — ABNORMAL HIGH (ref 98–107)
CO2: 24 mmol/L (ref 20.0–31.0)
CREATININE: 1.69 mg/dL — ABNORMAL HIGH (ref 0.55–1.02)
EGFR CKD-EPI (2021) FEMALE: 32 mL/min/1.73m2 — ABNORMAL LOW (ref >=60)
GLUCOSE RANDOM: 233 mg/dL — ABNORMAL HIGH (ref 70–179)
PHOSPHORUS: 3.5 mg/dL (ref 2.4–5.1)
POTASSIUM: 4.3 mmol/L (ref 3.4–4.8)
SODIUM: 145 mmol/L (ref 135–145)

## 2023-10-10 LAB — CBC W/ AUTO DIFF
BASOPHILS ABSOLUTE COUNT: 0.1 10*9/L (ref 0.0–0.1)
BASOPHILS RELATIVE PERCENT: 1 %
EOSINOPHILS ABSOLUTE COUNT: 0 10*9/L (ref 0.0–0.5)
EOSINOPHILS RELATIVE PERCENT: 0.8 %
HEMATOCRIT: 31.9 % — ABNORMAL LOW (ref 34.0–44.0)
HEMOGLOBIN: 10.6 g/dL — ABNORMAL LOW (ref 11.3–14.9)
LYMPHOCYTES ABSOLUTE COUNT: 0.4 10*9/L — ABNORMAL LOW (ref 1.1–3.6)
LYMPHOCYTES RELATIVE PERCENT: 8.3 %
MEAN CORPUSCULAR HEMOGLOBIN CONC: 33.3 g/dL (ref 32.0–36.0)
MEAN CORPUSCULAR HEMOGLOBIN: 38.6 pg — ABNORMAL HIGH (ref 25.9–32.4)
MEAN CORPUSCULAR VOLUME: 115.8 fL — ABNORMAL HIGH (ref 77.6–95.7)
MEAN PLATELET VOLUME: 7.8 fL (ref 6.8–10.7)
MONOCYTES ABSOLUTE COUNT: 0.5 10*9/L (ref 0.3–0.8)
MONOCYTES RELATIVE PERCENT: 8.8 %
NEUTROPHILS ABSOLUTE COUNT: 4.3 10*9/L (ref 1.8–7.8)
NEUTROPHILS RELATIVE PERCENT: 81.1 %
PLATELET COUNT: 849 10*9/L — ABNORMAL HIGH (ref 150–450)
RED BLOOD CELL COUNT: 2.75 10*12/L — ABNORMAL LOW (ref 3.95–5.13)
RED CELL DISTRIBUTION WIDTH: 18.4 % — ABNORMAL HIGH (ref 12.2–15.2)
WBC ADJUSTED: 5.3 10*9/L (ref 3.6–11.2)

## 2023-10-10 LAB — TACROLIMUS LEVEL, TROUGH: TACROLIMUS, TROUGH: 4.7 ng/mL — ABNORMAL LOW (ref 5.0–15.0)

## 2023-10-10 LAB — MAGNESIUM: MAGNESIUM: 1.7 mg/dL (ref 1.6–2.6)

## 2023-10-10 LAB — LACTATE DEHYDROGENASE: LACTATE DEHYDROGENASE: 503 U/L — ABNORMAL HIGH (ref 120–246)

## 2023-10-10 NOTE — Unmapped (Addendum)
 Continue hydroxyurea alternating doses: 1500mg /1000mg   Continue ASA 81mg  PO daily  Labs every 4 weeks at the [facility] lab  Follow up in 3 months in clinic  Start Aranesp subcutaneous q28 days for hgb <10 g/dL        If you have any questions, please do not hesitate to contact us.    If you experience new or worsening of the following symptoms, please call:  1. Night sweats  2. Abdominal discomfort in the left upper quadrant  3. Feeling full early  4. Excessive fatigue  5. Unintentional weight loss  6. Bone pain  7. Itching after hot showers    When reviewing your results, please remember that the results of many of the tests we order can vary somewhat and that variation often means nothing.  Sometimes when we get results back after your clinic visit, if it looks like there???s some variation of that type, we may decide to recheck things sooner than we discussed in clinic.  If you get a call that we want to recheck things sooner, do not panic. It does not mean that things are going wrong.    For appointments & questions Monday through Friday 8 AM-- 5 PM   please call [phone] or Toll free [phone].    On Nights, Weekends and Holidays  Call [phone] and ask for the adult hematologist/oncologist on call.    [name], AGPCNP-C, MSN  Nurse Practitioner  Hematologic Malignancies  [facility]    Nurse Navigator: Pete Glatter, RN  Questions and appointments M-F 8am - 5pm: [phone] or [phone]    [facility]  [address]  [place], [place] [address]  [email or web address]    Results for orders placed or performed in visit on 10/10/23   Comprehensive Metabolic Panel   Result Value Ref Range    Sodium 142 135 - 145 mmol/L    Potassium 4.2 3.4 - 4.8 mmol/L    Chloride 106 98 - 107 mmol/L    CO2 24.0 20.0 - 31.0 mmol/L    Anion Gap 12 5 - 14 mmol/L    BUN 53 (H) 9 - 23 mg/dL    Creatinine 8.54 (H) 0.55 - 1.02 mg/dL    BUN/Creatinine Ratio 32     eGFR CKD-EPI (2021) Female 32 (L) >=60 mL/min/1.2m2    Glucose 231 (H) 70 - 179 mg/dL    Calcium 9.8 8.7 - 62.7 mg/dL    Albumin 3.8 3.4 - 5.0 g/dL    Total Protein 6.7 5.7 - 8.2 g/dL    Total Bilirubin 0.5 0.3 - 1.2 mg/dL    AST 15 <=03 U/L    ALT <7 (L) 10 - 49 U/L    Alkaline Phosphatase 46 46 - 116 U/L   Lactate dehydrogenase   Result Value Ref Range    LDH 503 (H) 120 - 246 U/L   Magnesium Level   Result Value Ref Range    Magnesium 1.7 1.6 - 2.6 mg/dL   Renal Function Panel   Result Value Ref Range    Sodium 145 135 - 145 mmol/L    Potassium 4.3 3.4 - 4.8 mmol/L    Chloride 109 (H) 98 - 107 mmol/L    CO2 24.0 20.0 - 31.0 mmol/L    Anion Gap 12 5 - 14 mmol/L    BUN 53 (H) 9 - 23 mg/dL    Creatinine 5.00 (H) 0.55 - 1.02 mg/dL    BUN/Creatinine Ratio 31  eGFR CKD-EPI (2021) Female 32 (L) >=60 mL/min/1.72m2    Glucose 233 (H) 70 - 179 mg/dL    Calcium 16.1 8.7 - 09.6 mg/dL    Phosphorus 3.5 2.4 - 5.1 mg/dL    Albumin 3.8 3.4 - 5.0 g/dL   CBC w/ Differential   Result Value Ref Range    WBC 5.3 3.6 - 11.2 10*9/L    RBC 2.75 (L) 3.95 - 5.13 10*12/L    HGB 10.6 (L) 11.3 - 14.9 g/dL    HCT 04.5 (L) 40.9 - 44.0 %    MCV 115.8 (H) 77.6 - 95.7 fL    MCH 38.6 (H) 25.9 - 32.4 pg    MCHC 33.3 32.0 - 36.0 g/dL    RDW 81.1 (H) 91.4 - 15.2 %    MPV 7.8 6.8 - 10.7 fL    Platelet 849 (H) 150 - 450 10*9/L    Neutrophils % 81.1 %    Lymphocytes % 8.3 %    Monocytes % 8.8 %    Eosinophils % 0.8 %    Basophils % 1.0 %    Absolute Neutrophils 4.3 1.8 - 7.8 10*9/L    Absolute Lymphocytes 0.4 (L) 1.1 - 3.6 10*9/L    Absolute Monocytes 0.5 0.3 - 0.8 10*9/L    Absolute Eosinophils 0.0 0.0 - 0.5 10*9/L    Absolute Basophils 0.1 0.0 - 0.1 10*9/L    Macrocytosis Moderate (A) Not Present    Anisocytosis Slight (A) Not Present     *Note: Due to a large number of results and/or encounters for the requested time period, some results have not been displayed. A complete set of results can be found in Results Review.

## 2023-10-10 NOTE — Unmapped (Signed)
 Clinical Pharmacist Practitioner: [facility]     Erin Welch is a 73 y.o. female with CALR-mutated ET diagnosed 05/2003, progressing toward post-ET myelofibrosis, s/p renal transplant (04/04/2013) maintained on tacrolimus, stage 1 lung cancer s/p XRT 06/19/2021, DM2, COPD and pulmonary hypertension who I am seeing today for follow-up of hydroxyurea and darbepoetin.    Current hydroxyurea dose: 1g once daily  Current darbepoetin: 300 mcg q4wks prn hgb < 10 g/dL (not started yet)      Assessment and Plan:  -platelet count of 849, within target < 1000 x 10^9/L (anc 4.3, plt 849, hgb 10.6, labs ok to treat)  -adjust hydroxyurea dose back to prior dose of 1g alternating with 1.5 g  -continue darbepoetin 300 mcg every 4 weeks, hold if hgb > 10 g/dL  -next cbc/d at Erin Welch on 4/25, 6/6      Prescriptions:  -hydroxyurea #270 (90 days supply, 1 refill)  -Prescriptions sent electronically to [facility]'s preferred pharmacy [facility] on 10/11/23    F/u: 01/16/24 with Erin Welch  ___________________________________________________________________    Interim hydroxyurea history:    Patient reported missing 0 doses in the last month   Side effects: none  MCV today 115, was 116 on 09/19/23      Approximate face time spent with patient: 15 minutes     [name], PharmD, BCOP, CPP  Clinical Pharmacist Practitioner, Benign Hematology

## 2023-10-10 NOTE — Unmapped (Signed)
 Myeloproliferative Neoplasms & Bone Marrow Failure Clinic Follow Up Visit    Patient: Erin Welch  MRN: [id number]  DOB: 1951-03-13  Date of Visit: 10/10/2023     Reason for Visit   Erin Welch is a 73 y.o. with CALR-mutated ET diagnosed 05/2003, progressing toward post-ET myelofibrosis, s/p renal transplant (04/04/2013) maintained on tacrolimus, stage 1 lung cancer s/p XRT 06/19/2021, DM2, COPD and pulmonary hypertension who presents today in routine follow up.      Interval History   Since being seen2/19/25, Erin Welch has stopped pacritinib and restarted hydroxyurea.    History of Present Illness    The patient has a history of essential thrombocythemia and is currently managing her condition with Hydrea, taking sixteen 500 mg tablets weekly. Her platelet count was 669 on March 10th and has increased to 849. She had previously reduced her Hydrea dose to 16 weekly when her platelets were in the 600s. She has not experienced any oral or leg ulcers.    She reports experiencing two episodes of explosive diarrhea, one on Wednesday and another on Saturday. The diarrhea is described as 'watery with clumps in it' and was not accompanied by stomach cramps, nausea, or changes in appearance such as blood or unusual color. She mentions consuming a salad, half a sweet potato, and half a sirloin steak on Saturday, which might have contributed to the episode. No new foods or medications have been introduced, and she is unsure if it could be a stomach bug as her stomach does not bother her. She recalls a previous hospitalization due to diarrhea, which was more frequent and severe than her current symptoms, but notes that her current episodes are not as frequent.    Her hemoglobin was 11.2 three weeks ago and is currently 10.6. She has not yet started Aranesp injections as her hemoglobin levels have been stable. She received a blood transfusion on February 18th, prior to her last visit.    She has a history of high blood sugar levels and was taken off metformin after a hospitalization. Her blood sugar was 231 today. She resumed taking Jardiance about a month ago, which she believes has helped her hematocrit levels.    She has been wearing oxygen since August of last year due to shortness of breath, which persists without a clear cause. She does not require oxygen all the time and denies any new symptoms such as swelling in the ankles.        Review of Systems   10-systems otherwise reviewed and negative except as per Interval History.    MPN10: 28    Laboratory & Imaging Review   07/20/23 (reduced dose of HU due to anemia):  WBC 6.1, hgb 10.8, plts 1018  08/26/23 (on pacritinib x 11 days)  WBC 11.7, hgb 10.0, plts 1453  08/27/23: stopped pac, restarted HU  08/30/23: WBC 9.7, hgb 9.5, plts 1565    Assessment   #1 Essential thrombocytosis, CALR mutated  #2 Macrocytic anemia - transfusion-dependent again since 12/2022  #3 s/p renal transplant - etiology of renal failure is unclear  #4 CKD, eGFR 9ml/min - Now with CKD after kidney transplant in 2014.  NO LONGER ON DIALYSIS.  #5 Pulmonary HTN - severe pulm HTN noted on 08/19/23 echo, previously noted to be mild-moderate (01/2023).  Followed by Dr. Ala Welch    Assessment & Plan  Essential Thrombocythemia (ET)  Platelet count increased to 849,000 from 669,000. Currently on 16 tablets of 500 mg hydroxyurea weekly. Hemoglobin  stable at 10.6 g/dL. No bleeding, clotting, oral or leg ulcers from hydroxyurea. Liver function tests unchanged. Well-managed on current regimen. Hydroxyurea dosing adjusted to maintain platelet levels in the 600,000 range.  Re-initiation of Jardiance has improved hgb as well.  Has not needed Aranesp yet, likely due to this.    - Alternate hydroxyurea doses of 1500 mg and 1000 mg to maintain platelet levels in the 600,000 range.  - Monitor platelet counts and hemoglobin every four weeks.  - Administer Aranesp if hemoglobin drops below 10 g/dL to prevent transfusion.  - Ensure proper handling of hydroxyurea, including handwashing after handling.    Chronic Kidney Disease (CKD)  Creatinine at 1.69 mg/dL, indicating stable kidney function post-transplant. On tacrolimus, monitored by nephrology.  - Continue regular lab tests to monitor kidney function.      Diabetes Mellitus  Elevated blood sugar at 231 mg/dL. Previously on metformin and Jardiance, currently only on Jardiance. No past gastrointestinal side effects from metformin. Considering resuming metformin due to elevated blood sugar.  - Discuss resuming metformin with primary care physician for better glycemic control.  - Continue Jardiance as prescribed.    Intermittent Diarrhea  Two episodes of explosive diarrhea on separate days. No new foods or medications identified. No nausea, cramping, or changes in stool color or consistency. Previous hospitalization for diarrhea last year. Monitoring due to infrequency and lack of concerning symptoms.  - Monitor for recurrence or increased frequency of diarrhea.  - If diarrhea becomes frequent or persistent, collect stool sample for analysis through primary care.  - Consider dietary causes and monitor food intake.          Plan   Continue hydroxyurea alternating doses, though increase back to 17 cap/week: 1500mg /1000mg   Continue ASA 81mg  PO daily  Labs every 4 weeks at [facility] or [facility] lab  Follow up in 3 months in clinic  Start Aranesp subcutaneous q28 days for hgb <10 g/dL      Erin Jarvis, RN, MSN, AGPCNP-C  Nurse Practitioner  Hematologic Malignancies  [facility]  10/10/2023    I personally spent 40 minutes face-to-face and non-face-to-face in the care of this patient, which includes all pre, intra, and post visit time on the date of service. This time was spent in reviewing notes, labs and other test results in the chart, speaking with and examining the patient, communicating with other medical teams and documentation of the clinical encounter. All documented time was specific to the E/M visit and does not include any procedures that may have been performed.      History of Present Illness     Dylanie Quesenberry is a 73 y.o. woman with a history of chronic anemia, AVMs, thrombocytosis, ESRD s/p renal transplant (04/2013), hypertension, HFpEF, OSA, who presents for evaluation of chronic anemia in the setting of long-standing history of essential thrombocytosis.    She reports being initially diagnosed with thrombocytosis around 2004. At that time she was noted to have a platelet count > 2 million. This was incidentally noted on routine CBCs, and she was asymptomatic at the time. She had a bone marrow biopsy done (records not available to [place]). She was treated with anegrelide by a hematologist in [place], [place],  for many years which she did not tolerate it. She does not recall the side effects.      In 2011 she established with [facility] (Dr. Marcheta Grammes). She was switched to hydroxyurea. She reports a history of CKD dating back at  least to 2007. The etiology of her CKD was unclear. She underwent a renal transplant in 2014. Last year, her dose of hydroxyurea was increased and she developed worsening anemia. Evaluation of her anemia was remarkable for a borderline low vitamin B12 level, and an IgA kappa monoclonal gammopathy. She was treated with Aranesp monthly injection but reportedly the dose was too low (40 mcg/monthly) for many months and only recently increased to the appropriate level (200 mcg/monthly). She has not required any transfusion since October/November of last year. She completed four weekly doses of vitamin B12 injections.       Platelet history:        Hemoglobin history:        Hematology/Oncology History   Essential thrombocytosis   05/13/2003 Initial Diagnosis    Diagnosis = Essential thrombocythemia, 05/2003.  Dr. Sherrlyn Hock.   During a well-woman evaluation, patient was noted to have incidental thrombocytosis, reportedly to 2.2 million.  She was started on hydroxyurea + anagrelide and in ~2007 she developed anemia with low haptoglobin with concern for hemolysis though no clear source was found.  She was ultimately found to have multiple small bowel AVMs and required IV iron replacement and blood transfusions to manage.  She had genetic HHT evaluation due to the AVMs at [facility] which was negative in 2007.      Bone marrow biopsy = not done    Driver mutation = Not known.  JAK2 [id number]  Additional Myeloid Mutations = Not known.   Karyotype = Not known.     Thrombosis Risk =      05/13/2003 -  Chemotherapy    HYDROXYUREA + ANAGRELIDE  - started due to platelets of 2.2 million.    - unclear when stopped anagrelide     07/12/2012 -  Chemotherapy    HYDROXYUREA  Dropped to single agent hydroxyurea  (+) transfusion-dependent anemia, received pRBCs on:  --> 05/06/20 (2)  --> 06/09/20 (2)  Started on darbepoietin alfa (Aranesp) on 05/06/20    08/01/2020: WHOLE BODY PET-CT  - Mildly hypermetabolic right middle lobe nodule in the lateral segment as described above. Cannot exclude a low metabolic rate malignancy.      -Kidney transplant is noted in the left pelvis without abnormality noted. No obstruction is detected.      -Mild to moderate cardiomegaly is noted with small to moderate size pericardial effusion. This was not noted on preceding exams.     08/26/20: 1000mg  daily alternating with 500mg  daily  WBC 4.3. ANC 3.6. Hgb 9.8. MCV 124. Plts 542.     09/29/2020: Seen by Porfirio Oar, MD - [facility] (first visit)    [facility] 10/08/2020:  Markedly hypercellular bone marrow (70%) with features compatible with reported history of Essential Thrombocytosis and 4% blasts by manual aspirate differential (see Comment)  -  Mild and focally moderate reticulin fibrosis (MF1-2).  - Driver Mutation = CALR, Type 1. VAF 36%   - Additional Myeloid Mutations = not detected.   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 anc 3.7. Hgb 10.0. MCV 124. Plts 681.     11/09/2021: Seen by B. Reeves in follow up.   She continues on hydroyxurea, now back to 1000mg  po daily. She completed radiation therapy for lung cancer. Although she had been transfusion-dependent of red blood cells not responsive to ESAs and there was consideration for danazol, since starting Jardiance (SGL2 inhibitor) for diabetes, her hemoglobin has increased and she has not required a  blood transfusion since 05/11/2021.   WBC 4.7 with normal differential save for lymphopenia (ALC 0.3), hgb 10.7 g/dL with MCV 098 (from hydroxyurea), and platelets well-controlled at 552     04/13/2022: Seen by Shaune Pascal in follow up.   Continues on hydroxyurea 500mg  po BID (16 tablets/week)  Hgb 11.6 g/dL.  No transfusions.     09/02/2022: Hgb 12.0 g/dL  07/30/12: Hgb 78.2 g/dL  9/56/21: Hgb 8.9 g/dL  --> 09/17/63: 1U PRBC  02/23/23: Hgb 9.9 g/dL  --> 7/84/69: 1U PRBC  03/07/23: hgb 7.3 g/dL  --> 01/08/51: 2U PRBC  03/23/23: seen by Suezanne Cheshire in follow up.   --> continue HU 500mg  po BID (16 pills/wk)  03/28/23: Hgb 9.7 g/L.  Plts 1091.   --> increased HU to 17 pills/wk per Dr. Marcheta Grammes  05/09/23: Hgb 9.2 g/dL.  Plts 708. WBC 3.2.      05/09/2023 Biopsy    BONE MARROW BIOPSY  Indication = worsening anemia  @[facility]     -  Hypercellular bone marrow (60%) with features compatible with reported history of Essential Thrombocytosis and 3% blasts by manual aspirate differential (see Comment)  -  Mild reticulin fibrosis and no significant collagen fibrosis (MF-1)    Peripheral blood:   WBC 3.2, ANC 2.6, Hgb 9.2, MCV 1259, Plts 708.   No leukoerythroblastsosis    No LDH from 05/09/23, but from 03/08/23: 365 (ULN 246).      08/15/2023 - 08/27/2023 Chemotherapy    PACRITINIB    Indication = post-ET MF with substantial anemia on hydroxyurea while simultaneously has elevated platelets to 1018.  Ruxolitinib not used given anemia.  Momelotinib considered, but with the post-marketing reports of 20% renal injury, elected for pacritinib instead.     12-lead EKG ORDERED 07/26/23 as baseline before starting pacritinib. It has not yet been completed.     Stopped due to need for rapid cytoreduction while inpatient (plts 1546).  Resumed HU     08/27/2023 -  Chemotherapy    HYDROXYUREA - alternating 1500mg  and 1000mg  (17/wk)  08/28/23: WBC 9.2, hgb 9.7, plts 1546  08/30/23: WBC 9.7, hgb 9.5, plts 1565  09/19/23: WBC 3.9, hgb 11.2, plts 669  Self decreased to 16/wk  10/10/23: WBC 5.3, hgb 10.6, plts 849  Increased to 17/wk               Medications     Current Outpatient Medications:     acetaminophen (TYLENOL 8 HOUR) 650 MG CR tablet, Take 1 tablet (650 mg total) by mouth nightly., Disp: , Rfl:     albuterol HFA 90 mcg/actuation inhaler, Inhale 2 puffs every six (6) hours as needed for wheezing or shortness of breath., Disp: 1 Inhaler, Rfl: 0    allopurinol (ZYLOPRIM) 100 MG tablet, TAKE 2 TABLETS BY MOUTH IN THE MORNING, Disp: 180 tablet, Rfl: 3    amlodipine (NORVASC) 5 MG tablet, Take 1 tablet by mouth once daily, Disp: 90 tablet, Rfl: 0    aspirin (ECOTRIN) 81 MG tablet, Take 1 tablet (81 mg total) by mouth daily., Disp: , Rfl:     carvedilol (COREG) 25 MG tablet, Take 1 tablet by mouth twice daily, Disp: 180 tablet, Rfl: 0    cephalexin (KEFLEX) 250 MG capsule, Take 1 capsule by mouth once daily, Disp: 90 capsule, Rfl: 3    cholecalciferol, vitamin D3 25 mcg, 1,000 units,, 1,000 unit (25 mcg) tablet, Take 1 tablet (25 mcg total) by mouth daily. (Patient taking differently: Take 1 tablet (25  mcg total) by mouth two (2) times a day.), Disp: 90 tablet, Rfl: 3    colchicine (COLCRYS) 0.6 mg tablet, Take 1 tablet (0.6 mg total) by mouth two (2) times a day as needed. For acute gout, take 2 tablets by mouth, then 1 tablet one hour later on first day. May continue 1 tablet up to twice a day as needed., Disp: , Rfl:     darbepoetin alfa-polysorbate (ARANESP) 300 mcg/0.6 mL Syrg, Inject 0.6 mL (300 mcg total) under the skin every twenty-eight (28) days., Disp: 0.6 mL, Rfl: 11 empagliflozin (JARDIANCE) 10 mg tablet, Take 1 tablet by mouth once daily, Disp: 90 tablet, Rfl: 3    eplerenone (INSPRA) 25 MG tablet, Take 1 tablet (25 mg total) by mouth daily., Disp: , Rfl:     fluticasone propionate (FLONASE) 50 mcg/actuation nasal spray, Use 1 spray(s) in each nostril twice daily (Patient taking differently: 1 spray into each nostril daily as needed.), Disp: 16 g, Rfl: 11    furosemide (LASIX) 40 MG tablet, Take 1 tablet (40 mg total) by mouth two (2) times a day. (Patient taking differently: Take 2 tablets (80 mg total) by mouth daily.), Disp: 180 tablet, Rfl: 3    glucosamine-chondroitin 500-400 mg tablet, Take 1 tablet by mouth Two (2) times a day., Disp: , Rfl:     losartan (COZAAR) 50 MG tablet, Take 1 tablet by mouth once daily, Disp: 90 tablet, Rfl: 0    MEDICAL SUPPLY ITEM, 14 cm into each nostril nightly. CPAP Auto titrating machine 14cmH2O 3PR  [employer] is patient's DME provider., Disp: , Rfl:     melatonin 10 mg Tab, Take 1 tablet (10 mg total) by mouth nightly., Disp: , Rfl:     [Paused] metFORMIN (GLUCOPHAGE) 500 MG tablet, Take 1 tablet (500 mg total) by mouth in the morning and 1 tablet (500 mg total) in the evening. Take with meals., Disp: 180 tablet, Rfl: 3    MYFORTIC 180 mg EC tablet, Take 2 tablets (360 mg total) by mouth two (2) times a day., Disp: 120 tablet, Rfl: 11    OMEGA-3/DHA/EPA/FISH OIL (FISH OIL-OMEGA-3 FATTY ACIDS) 300-1,000 mg capsule, Take 1 capsule (1 g total) by mouth daily., Disp: , Rfl:     pantoprazole (PROTONIX) 40 MG tablet, Take 1 tablet by mouth once daily, Disp: 90 tablet, Rfl: 0    pravastatin (PRAVACHOL) 40 MG tablet, Take 1 tablet by mouth once daily, Disp: 90 tablet, Rfl: 3    PROGRAF 0.5 mg capsule, Take 2 capsules (1 mg total) by mouth in the morning. Take 1 mg in morning and 0.5 mg in evening., Disp: , Rfl:     sodium bicarbonate 650 mg tablet, Take 1 tablet (650 mg total) by mouth two (2) times a day. NEW DOSE, Disp: , Rfl:     umeclidinium (INCRUSE ELLIPTA) 62.5 mcg/actuation inhaler, Inhale 1 puff by mouth once daily, Disp: 90 each, Rfl: 3    zolpidem (AMBIEN) 5 MG tablet, Take 1 tablet (5 mg total) by mouth nightly as needed., Disp: 30 tablet, Rfl: 3    hydroxyurea (HYDREA) 500 mg capsule, Take 3 capsules (1500 mg total) every other day, with 2 capsules (1000 mg total) on the other days., Disp: 270 capsule, Rfl: 1    Allergies     Allergies   Allergen Reactions    Macrobid [Nitrofurantoin Monohyd/M-Cryst] Other (See Comments)     Duplicate    Nitrofurantoin Nausea And Vomiting  Past Medical and Surgical History     Past Medical History:   Diagnosis Date    Actinic keratosis     Acute injury of anterior cruciate ligament     Allergic Myfodic    Anemia, iron deficiency     AVM (arteriovenous malformation)     small bowel diagnosed by capsule camer    Basal cell carcinoma 2018    Cataract     Chronic kidney disease     Clotting disorder     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    Diabetes 2 09/02/2022    Diastolic CHF     RHC 10/2012: PCWP 25 MPAP 40 PVR 2.4    Dyspnea on exertion 02/07/2015    Essential thrombocytosis 2005    Heart disease 07/12/2014    History of renal transplant 03/2013    complicated by delayed onset graft function    Hypertension     Inverted nipple 01/19/2021    bilateral and pt states always have been inverted    Joint pain     Obstructive sleep apnea     on home CPAP therapy    Osteoarthritis     Pulmonary edema 01/07/2013    Pulmonary hypertension     Squamous cell skin cancer      Past Surgical History:   Procedure Laterality Date    HAND SURGERY      PR CYSTOSCOPY,REMV CALCULUS,SIMPLE  05/15/2013    Procedure: CYSTOURETHROSCOPY, WITH REMOVAL OF FOREIGN BODY, CALCULUS OR URETERAL STENT FROM URETHRA OR BLADDER; SIMPLE;  Surgeon: Randalyn Rhea, MD;  Location: MAIN OR [place];  Service: Urology    PR REMOVAL TUNNELED INTRAPERITONEAL CATHETER N/A 05/15/2013    Procedure: REMOVAL OF PERMANENT INTRAPERITONEAL CANNULA OR CATHETER; Surgeon: Purcell Mouton, MD;  Location: MAIN OR [facility];  Service: Transplant    PR RIGHT HEART CATH O2 SATURATION & CARDIAC OUTPUT N/A 03/24/2015    Procedure: Right Heart Catheterization;  Surgeon: Mittie Bodo, MD;  Location: [facility] CATH;  Service: Cardiology    PR TRANSPLANT,PREP CADAVER RENAL GRAFT N/A 04/04/2013    Procedure: [facility] STD PREP CAD DONR RENAL ALLOGFT PRIOR TO TRNSPLNT, INCL DISSEC/REM PERINEPH FAT, DIAPH/RTPER ATTAC;  Surgeon: Vivi Barrack, MD;  Location: MAIN OR [place];  Service: Transplant    PR TRANSPLANTATION OF KIDNEY N/A 04/04/2013    Procedure: RENAL ALLOTRANSPLANTATION, IMPLANTATION OF GRAFT; WITHOUT RECIPIENT NEPHRECTOMY;  Surgeon: Vivi Barrack, MD;  Location: MAIN OR [place];  Service: Transplant    SKIN BIOPSY      knee and back        Social History     Social History     Socioeconomic History    Marital status: Married    Number of children: 3   Occupational History    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 Yes    Tanning bed use? No    Are you easily burned? Yes    Excessive sun exposure? No    Blistering sunburns? No   Social History Narrative    Lives in one-level home in [place] with husband Marilu Favre) x 53 years.     Has 3 children (Buddy, Billy, Brad).     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     Current hobbies include: domestic travel, spending time with grandkids         ADLs:    Feeding: Independent    Dressing: Independent    Ambulation: Independent    Toileting: Independent    Bathing: Independent        IADLs:    Using the telephone: Independent    Shopping: Independent    Meal preparation: Independent    Medication management: Independent    Managing finances: Independent    Housework: Dependent    Transportation (driving or navigating public transit): Independent         Assistive devices: electric W/C for longer distances;  Has quad cane          ----        Lives at home with husband. Independent, drives. Has 3 adult children. Former smoker. Rare alcohol use. No recreational drugs.      Social Drivers of Psychologist, prison and probation services Strain: Low Risk  (03/03/2023)    Overall Financial Resource Strain (CARDIA)     Difficulty of Paying Living Expenses: Not hard at all   Food Insecurity: No Food Insecurity (03/03/2023)    Hunger Vital Sign     Worried About Running Out of Food in the Last Year: Never true     Ran Out of Food in the Last Year: Never true   Transportation Needs: No Transportation Needs (03/03/2023)    PRAPARE - Therapist, art (Medical): No     Lack of Transportation (Non-Medical): No   Housing: Low Risk  (03/03/2023)    Housing     Within the past 12 months, have you ever stayed: outside, in a car, in a tent, in an overnight shelter, or temporarily in someone else's home (i.e. couch-surfing)?: No     Are you worried about losing your housing?: No       Family History   Father had MI  4 siblings  No family history of blood cancers or disorders.    Physical Examination     Vitals:    10/10/23 1209   BP: 154/57   Pulse: 66   Resp: 17   Temp: 36.7 ??C (98.1 ??F)   TempSrc: Temporal   SpO2: 95%   Weight: 87.9 kg (193 lb 12.6 oz)             GENERAL:  Well-appearing and in no apparent distress.  No apparent dysmorphia.  Accompanied by her husband.  Nasal cannula in place.   HEENT:  Anicteric sclera.    CV:  RRR.  +II/VI murmur heard best at RUSB.  RESP:  Lung bases are clear. Speaking comfortably in full sentences.   EXTREMITIES:  No clubbing, cyanosis, nor edema.  NEURO:  AO X 4.   SKIN: No petechiae nor purpura.  No rashes.   PSYCH: Affect is full and congruent.  No psychomotor  agitation nor retardation.  Thought process is goal-directed and linear.      Laboratory Testing and Imaging     Results for orders placed or performed in visit on 10/10/23   Comprehensive Metabolic Panel   Result Value Ref Range    Sodium 142 135 - 145 mmol/L    Potassium 4.2 3.4 - 4.8 mmol/L    Chloride 106 98 - 107 mmol/L    CO2 24.0 20.0 - 31.0 mmol/L    Anion Gap 12 5 - 14 mmol/L    BUN 53 (H) 9 - 23 mg/dL    Creatinine 8.46 (H) 0.55 - 1.02 mg/dL    BUN/Creatinine Ratio 32     eGFR CKD-EPI (2021) Female 32 (L) >=60 mL/min/1.42m2    Glucose 231 (H) 70 - 179 mg/dL    Calcium 9.8 8.7 - 96.2 mg/dL    Albumin 3.8 3.4 - 5.0 g/dL    Total Protein 6.7 5.7 - 8.2 g/dL    Total Bilirubin 0.5 0.3 - 1.2 mg/dL    AST 15 <=95 U/L    ALT <7 (L) 10 - 49 U/L    Alkaline Phosphatase 46 46 - 116 U/L   Lactate dehydrogenase   Result Value Ref Range    LDH 503 (H) 120 - 246 U/L   Magnesium Level   Result Value Ref Range    Magnesium 1.7 1.6 - 2.6 mg/dL   Renal Function Panel   Result Value Ref Range    Sodium 145 135 - 145 mmol/L    Potassium 4.3 3.4 - 4.8 mmol/L    Chloride 109 (H) 98 - 107 mmol/L    CO2 24.0 20.0 - 31.0 mmol/L    Anion Gap 12 5 - 14 mmol/L    BUN 53 (H) 9 - 23 mg/dL    Creatinine 2.84 (H) 0.55 - 1.02 mg/dL    BUN/Creatinine Ratio 31     eGFR CKD-EPI (2021) Female 32 (L) >=60 mL/min/1.29m2    Glucose 233 (H) 70 - 179 mg/dL    Calcium 13.2 8.7 - 44.0 mg/dL    Phosphorus 3.5 2.4 - 5.1 mg/dL    Albumin 3.8 3.4 - 5.0 g/dL   Tacrolimus Level, Trough   Result Value Ref Range    Tacrolimus, Trough 4.7 (L) 5.0 - 15.0 ng/mL   CBC w/ Differential   Result Value Ref Range    WBC 5.3 3.6 - 11.2 10*9/L    RBC 2.75 (L) 3.95 - 5.13 10*12/L    HGB 10.6 (L) 11.3 - 14.9 g/dL    HCT 10.2 (L) 72.5 - 44.0 %    MCV 115.8 (H) 77.6 - 95.7 fL    MCH 38.6 (H) 25.9 - 32.4 pg    MCHC 33.3 32.0 - 36.0 g/dL    RDW 36.6 (H) 44.0 - 15.2 %    MPV 7.8 6.8 - 10.7 fL    Platelet 849 (H) 150 - 450 10*9/L    Neutrophils % 81.1 %    Lymphocytes % 8.3 %    Monocytes % 8.8 %    Eosinophils % 0.8 %    Basophils % 1.0 %    Absolute Neutrophils 4.3 1.8 - 7.8 10*9/L    Absolute Lymphocytes 0.4 (L) 1.1 - 3.6 10*9/L    Absolute Monocytes 0.5 0.3 - 0.8 10*9/L    Absolute Eosinophils 0.0 0.0 - 0.5 10*9/L    Absolute Basophils 0.1 0.0 - 0.1 10*9/L    Macrocytosis Moderate (A) Not Present  Anisocytosis Slight (A) Not Present     *Note: Due to a large number of results and/or encounters for the requested time period, some results have not been displayed. A complete set of results can be found in Results Review.

## 2023-10-11 MED ORDER — HYDROXYUREA 500 MG CAPSULE
ORAL_CAPSULE | 1 refills | 0 days | Status: CP
Start: 2023-10-11 — End: ?

## 2023-10-17 NOTE — Unmapped (Signed)
 Erin Welch has been contacted in regards to their refill of Aransep. At this time, they have declined refill due to  has not started therapy . Refill assessment call date has been updated per the patient's request.

## 2023-10-18 MED ORDER — JARDIANCE 10 MG TABLET
ORAL_TABLET | Freq: Every day | ORAL | 0 refills | 0.00 days
Start: 2023-10-18 — End: ?

## 2023-10-19 MED ORDER — JARDIANCE 10 MG TABLET
ORAL_TABLET | Freq: Every day | ORAL | 0 refills | 90.00 days | Status: CP
Start: 2023-10-19 — End: ?

## 2023-10-19 NOTE — Unmapped (Signed)
 Refill request received for patient.      Medication Requested: Erin Welch  Last Office Visit: 08/12/2023   Next Office Visit: 02/17/2024  Last Prescriber: Ermalinda Hays    Nurse refill requirements met? Yes  If not met, why: n/a    Sent to: Provider for signing  If sent to provider, which provider?: [facility]

## 2023-10-20 NOTE — Unmapped (Signed)
 Faxed orders and most recent office notes to [employer]. Copy imported to Media.

## 2023-11-04 ENCOUNTER — Ambulatory Visit: Admit: 2023-11-04 | Discharge: 2023-11-04 | Payer: MEDICARE

## 2023-11-04 DIAGNOSIS — R0600 Dyspnea, unspecified: Principal | ICD-10-CM

## 2023-11-04 DIAGNOSIS — I272 Pulmonary hypertension, unspecified: Principal | ICD-10-CM

## 2023-11-04 DIAGNOSIS — G4733 Obstructive sleep apnea (adult) (pediatric): Principal | ICD-10-CM

## 2023-11-04 DIAGNOSIS — Z940 Kidney transplant status: Principal | ICD-10-CM

## 2023-11-04 DIAGNOSIS — I5030 Unspecified diastolic (congestive) heart failure: Principal | ICD-10-CM

## 2023-11-04 LAB — CBC W/ AUTO DIFF
BASOPHILS ABSOLUTE COUNT: 0 10*9/L (ref 0.0–0.1)
BASOPHILS RELATIVE PERCENT: 0.6 %
EOSINOPHILS ABSOLUTE COUNT: 0 10*9/L (ref 0.0–0.5)
EOSINOPHILS RELATIVE PERCENT: 0.6 %
HEMATOCRIT: 31.8 % — ABNORMAL LOW (ref 34.0–44.0)
HEMOGLOBIN: 10.7 g/dL — ABNORMAL LOW (ref 11.3–14.9)
LYMPHOCYTES ABSOLUTE COUNT: 0.4 10*9/L — ABNORMAL LOW (ref 1.1–3.6)
LYMPHOCYTES RELATIVE PERCENT: 11.4 %
MEAN CORPUSCULAR HEMOGLOBIN CONC: 33.5 g/dL (ref 32.0–36.0)
MEAN CORPUSCULAR HEMOGLOBIN: 39.5 pg — ABNORMAL HIGH (ref 25.9–32.4)
MEAN CORPUSCULAR VOLUME: 118.1 fL — ABNORMAL HIGH (ref 77.6–95.7)
MEAN PLATELET VOLUME: 7.9 fL (ref 6.8–10.7)
MONOCYTES ABSOLUTE COUNT: 0.2 10*9/L — ABNORMAL LOW (ref 0.3–0.8)
MONOCYTES RELATIVE PERCENT: 6 %
NEUTROPHILS ABSOLUTE COUNT: 3.2 10*9/L (ref 1.8–7.8)
NEUTROPHILS RELATIVE PERCENT: 81.4 %
PLATELET COUNT: 679 10*9/L — ABNORMAL HIGH (ref 150–450)
RED BLOOD CELL COUNT: 2.7 10*12/L — ABNORMAL LOW (ref 3.95–5.13)
RED CELL DISTRIBUTION WIDTH: 18.4 % — ABNORMAL HIGH (ref 12.2–15.2)
WBC ADJUSTED: 4 10*9/L (ref 3.6–11.2)

## 2023-11-04 LAB — LACTATE DEHYDROGENASE: LACTATE DEHYDROGENASE: 386 U/L — ABNORMAL HIGH (ref 120–246)

## 2023-11-04 LAB — COMPREHENSIVE METABOLIC PANEL
ALBUMIN: 3.9 g/dL (ref 3.4–5.0)
ALKALINE PHOSPHATASE: 54 U/L (ref 46–116)
ALT (SGPT): 8 U/L — ABNORMAL LOW (ref 10–49)
ANION GAP: 12 mmol/L (ref 5–14)
AST (SGOT): 14 U/L (ref <=34)
BILIRUBIN TOTAL: 0.6 mg/dL (ref 0.3–1.2)
BLOOD UREA NITROGEN: 58 mg/dL — ABNORMAL HIGH (ref 9–23)
BUN / CREAT RATIO: 35
CALCIUM: 10 mg/dL (ref 8.7–10.4)
CHLORIDE: 107 mmol/L (ref 98–107)
CO2: 23 mmol/L (ref 20.0–31.0)
CREATININE: 1.64 mg/dL — ABNORMAL HIGH (ref 0.55–1.02)
EGFR CKD-EPI (2021) FEMALE: 33 mL/min/1.73m2 — ABNORMAL LOW (ref >=60)
GLUCOSE RANDOM: 232 mg/dL — ABNORMAL HIGH (ref 70–179)
POTASSIUM: 3.7 mmol/L (ref 3.4–4.8)
PROTEIN TOTAL: 6.7 g/dL (ref 5.7–8.2)
SODIUM: 142 mmol/L (ref 135–145)

## 2023-11-04 LAB — PHOSPHORUS: PHOSPHORUS: 3.7 mg/dL (ref 2.4–5.1)

## 2023-11-04 LAB — PRO-BNP: PRO-BNP: 2137 pg/mL — ABNORMAL HIGH (ref <=300.0)

## 2023-11-04 LAB — MAGNESIUM: MAGNESIUM: 1.9 mg/dL (ref 1.6–2.6)

## 2023-11-04 NOTE — Unmapped (Signed)
 [facility]  at [facility]  Pulmonary Hypertension Program                            Cassell Cliche, MD--Director (Pulmonary)  Randy Buttery. Tura Gaines, MD--Associate Director (Pulmonary)  Delberta Fee, MD (Cardiology)    Gerome Koyanagi, RN Nurse Coordinator  Cass Clerk, RN Nurse Coordinator      [phone] office  [phone] fax            Assessment:      [name] is a 73 y.o. 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      She should continue diuretics.  Follow with Nephrology as scheduled.      Also needs continued treatment with CPAP--she is using consistently.      PFT and imaging are c/w COPD.  Continue Incruse as she did not tolerate LABA/ICS.    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    Will proceed with RHC given recent echo findings and question about need for increased diuresis.         Plan:       [name] is a 73 y.o. 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     The patient's current NYHA functional class is  2 .     1) continue CPAP at 17 cmH2O.      2)  Continue Incruse      3) possible increase in diuretic is warranted but given recent echo will get RHC to determine hemodynamics and need for additional lasix.      4) pro BNP add on to today's labs.    5) RHC           Subjective:      PRIOR HPI: 73 y/o female with concern for pulmonary hypertension.  H/o renal transplant.      Had recent CPAP titration that prompted adjustment in settings.      She is now on the new settings.  No problems with new settings.  Seems to have slightly worse dyspnea in recent months.  Continued LE edema--was switched to daily Lasix at time of last cath attempt.  She is due to follow with Dr. Ferman Houston regarding possible dose increase.      No syncope.      Recent RHC attempt via RIJ unsuccessful due to concern for vascular stenosis.  Will plan to repeat with possible femoral approach but still working to optimize volume status and OSA tx as above.      Had cough--was started on flonase about 1 month ago with some improvement.    INTERVAL HPI 11/07/15:  CPAP titration done 11/16, CPAP increased and echo repeated 07/2015.      Today complains mostly of nasal congestion and rhinorrhea she attributes to pollen.  Using H1 blockers with some relief.  Uses flonase as well.      No syncope or near syncope.      INTERVAL HPI 12/27/19:    Patient last seen 4 years ago.  Was doing well until about 06/2019 at which time she got infected with COVID 19 and developed pneumonia.  She was discharged on supplemental O2 and had to use this until approx mid Feb 2021.      She now owns a POC that she uses when travelling to [place].      She has had persistent dyspnea on exertion since having COVID pneumonia.      She has noted her O2 saturation drops to 85% with exertion on room air.  She has not been using supplemental O2 during the day.     She has also noted increased LE edema and her pro-BNP has been higher since she had COVID 19.  She increased her diuretics on her own after returning from [place] 6 days ago.  This has helped the edema.      Husband has noticed patient breathes more rapidly with sleep even with CPAP and patient wakes frequently.  She has not had a PSG in many years.     INTERVAL HPI 04/01/20:    Patient seen 3 months ago.  Notes continued dyspnea.  CT chest with no clear ILD.    Diuretics were increased after last visit but no clear improvement in dyspnea.      Not on bronchodilator at this time.     No syncope or near syncope.  Denies chest pain.      INTERVAL HPI 10/03/20:    Currently on lasix 40 mg daily.  She was seen by IP for mildly PET avid pulm nodule that has been stable in size. Consideration for bronch with bx under GA vs serial radiographic monitoring.    Patient has some LE edema.  Denies significant LE edema per her account.     Continues with dyspnea, somewhat improved from last visit.      No chest pain.      NO syncope or near syncope.     Could not tolerate Breo due to headache and feeling anxious.      INTERVAL HPI 05/05/21:    Patient here for follow up.  There is now consideration of XRT to RML pulmonary nodule due to concern for malignancy given continued growth (slow).      No plan for procedure with sedation or anesthesia at this time.      She continues on autotitrating CPAP.       She is now on Incruse and tolerating.    Denies syncope or near syncope.    Some occasional LE edema.      Denies chest pain.      Continues to have dyspnea on exertion to same degree.    INTERVAL HPI 12/22/21:    Now has received XRT to pulmonary nodule and is undergoing serial imaging monitoring.    Continues on CPAP.  Did not qualify for new CPAP machine as current device <69 years old.    Continues on Incruse for moderate obstructive lung disease.      Was having increased dyspnea in context of shoulder pain. Got injection for this and is now improved with improved dyspnea.    She is now on Jardiance and tolerating this well.    No syncope or near syncope.     INTERVAL HPI 06/11/22:    Patient reports she has had intermittent episodes of increased dyspnea and oxygen desaturations to the mid 80s with exertion.    This has been occurring since approximately 3 months ago.  She has  had periods of improvement only to worsen again.      NO chest pain.  Has had some increased cough during this time.  No wheezing.  No hemoptysis.      No LE edema.    No syncope or near syncope.      Denies PND or orthopnea.    INTERVAL HPI 11/12/22:    Continues on Incruse.    No LE edema.      No syncope or near syncope.      Feels her dyspnea is stable on the whole.  No PND or orthopnea.      Recent CT chest with radiation fibrosis/pneumonitis.      Had recent ONO with some desaturation so CPAP titration was ordered.  This is pending.      INTERVAL HPI 05/20/23:    Follow up visit today.  Patient was admitted in August for volume depletion and hypokalemia related to diarrhea.      She was treated with K and IV fluids.      States that she has needed more Oxygen suppl since discharge.      Also had CPAP titration study since last visit.  Continues on her CPAP auto-titrating currently.      She does have orthopnea but no clear PND.      INTERVAL HPI 11/04/23:    Continues on suppl O2.  Anywhere from 1-3 LPM.    Was recently admitted in Feb with increased dyspnea--ruled out PE. Symptoms resolved.      No change in ongoing dyspnea.      Had echo during that admission that showed increased estimate PA pressures.    Pro BNP has continued to rise.      No syncope or near syncope.    Review of Systems  A 12 point review of systems was negative except for pertinent items noted in the HPI.    Allergies   Allergen Reactions    Macrobid [Nitrofurantoin Monohyd/M-Cryst] Other (See Comments)     Duplicate    Nitrofurantoin Nausea And Vomiting      Current Outpatient Medications   Medication Sig Dispense Refill    acetaminophen (TYLENOL 8 HOUR) 650 MG CR tablet Take 1 tablet (650 mg total) by mouth nightly.      albuterol HFA 90 mcg/actuation inhaler Inhale 2 puffs every six (6) hours as needed for wheezing or shortness of breath. 1 Inhaler 0    allopurinol (ZYLOPRIM) 100 MG tablet TAKE 2 TABLETS BY MOUTH IN THE MORNING 180 tablet 3    amlodipine (NORVASC) 5 MG tablet Take 1 tablet by mouth once daily 90 tablet 0    aspirin (ECOTRIN) 81 MG tablet Take 1 tablet (81 mg total) by mouth daily.      carvedilol (COREG) 25 MG tablet Take 1 tablet by mouth twice daily 180 tablet 0    cephalexin (KEFLEX) 250 MG capsule Take 1 capsule by mouth once daily 90 capsule 3    cholecalciferol, vitamin D3 25 mcg, 1,000 units,, 1,000 unit (25 mcg) tablet Take 1 tablet (25 mcg total) by mouth daily. (Patient taking differently: Take 1 tablet (25 mcg total) by mouth two (2) times a day.) 90 tablet 3    colchicine (COLCRYS) 0.6 mg tablet Take 1 tablet (0.6 mg total) by mouth two (2) times a day as needed. For acute gout, take 2 tablets by mouth, then 1 tablet one hour later on first day. May continue 1 tablet up to twice a  day as needed.      darbepoetin alfa-polysorbate (ARANESP) 300 mcg/0.6 mL Syrg Inject 0.6 mL (300 mcg total) under the skin every twenty-eight (28) days. 0.6 mL 11    eplerenone (INSPRA) 25 MG tablet Take 1 tablet (25 mg total) by mouth daily.      fluticasone propionate (FLONASE) 50 mcg/actuation nasal spray Use 1 spray(s) in each nostril twice daily (Patient taking differently: 1 spray into each nostril daily as needed.) 16 g 11    furosemide (LASIX) 40 MG tablet Take 1 tablet (40 mg total) by mouth two (2) times a day. (Patient taking differently: Take 2 tablets (80 mg total) by mouth daily.) 180 tablet 3    glucosamine-chondroitin 500-400 mg tablet Take 1 tablet by mouth Two (2) times a day.      hydroxyurea (HYDREA) 500 mg capsule Take 3 capsules (1500 mg total) every other day, with 2 capsules (1000 mg total) on the other days. 270 capsule 1    JARDIANCE 10 mg tablet Take 1 tablet by mouth once daily 90 tablet 0    losartan (COZAAR) 50 MG tablet Take 1 tablet by mouth once daily 90 tablet 0    MEDICAL SUPPLY ITEM 14 cm into each nostril nightly. CPAP Auto titrating machine 14cmH2O 3PR  [employer] is patient's DME provider.      melatonin 10 mg Tab Take 1 tablet (10 mg total) by mouth nightly.      [Paused] metFORMIN (GLUCOPHAGE) 500 MG tablet Take 1 tablet (500 mg total) by mouth in the morning and 1 tablet (500 mg total) in the evening. Take with meals. 180 tablet 3    MYFORTIC 180 mg EC tablet Take 2 tablets (360 mg total) by mouth two (2) times a day. 120 tablet 11    OMEGA-3/DHA/EPA/FISH OIL (FISH OIL-OMEGA-3 FATTY ACIDS) 300-1,000 mg capsule Take 1 capsule (1 g total) by mouth daily.      pantoprazole (PROTONIX) 40 MG tablet Take 1 tablet by mouth once daily 90 tablet 0    pravastatin (PRAVACHOL) 40 MG tablet Take 1 tablet by mouth once daily 90 tablet 3    PROGRAF 0.5 mg capsule Take 2 capsules (1 mg total) by mouth in the morning. Take 1 mg in morning and 0.5 mg in evening.      sodium bicarbonate 650 mg tablet Take 1 tablet (650 mg total) by mouth two (2) times a day. NEW DOSE      umeclidinium (INCRUSE ELLIPTA) 62.5 mcg/actuation inhaler Inhale 1 puff by mouth once daily 90 each 3    zolpidem (AMBIEN) 5 MG tablet Take 1 tablet (5 mg total) by mouth nightly as needed. 30 tablet 3     No current facility-administered medications for this visit.         Objective:   Objective   Physical Exam:     Vitals:    11/04/23 1418   BP: 134/68   BP Site: L Arm   BP Position: Sitting   BP Cuff Size: Large   Pulse: 69   Temp: 36.2 ??C (97.1 ??F)   TempSrc: Temporal   SpO2: 97%   Weight: 87.2 kg (192 lb 3.2 oz)       General Appearance:    Alert, cooperative, no distress, appears stated age   Head:    Normocephalic, without obvious abnormality, atraumatic   Eyes:    PERRL, conjunctiva clear, EOM's intact    Neck:   Supple, trachea midline, no adenopathy;   No  JVD   Lungs:     Good breath sounds b/l, no crackles or wheezes, respirations unlabored   Chest Wall:    No tenderness or deformity    Heart:    Regular rate and rhythm, S1 and S2 normal, no murmur, rub   or gallop   Abdomen:     Soft, non-tender   Extremities:   Extremities normal, no cyanosis, clubbing, + trace edema   Pulses:   2+ and symmetric all extremities   Skin:   No rashes or lesions   Lymph nodes:   No cervical or supraclavicular adenopathy Neurologic:   No focal neurological deficits       Echo 08/2023:    Summary    1. The left ventricle is normal in size with mildly increased wall  thickness.    2. The left ventricular systolic function is normal, LVEF is visually  estimated at > 55%.    3. Mitral annular calcification is present (moderate).    4. The mitral valve leaflets are mildly thickened with normal leaflet  mobility.    5. There is mild mitral valve regurgitation.    6. The aortic valve is probably trileaflet with moderately thickened  leaflets with moderately reduced excursion.    7. The left atrium is moderately dilated in size.    8. There is severe pulmonary hypertension.    9. The right ventricle is mildly dilated in size, with normal systolic  function.    10. The right atrium is mildly dilated in size.        Left Ventricle    The left ventricle is normal in size with mildly increased wall thickness.  The left ventricular systolic function is normal, LVEF is visually estimated  at > 55%. Left ventricular diastolic function cannot be accurately assessed.     Right Ventricle    The right ventricle is mildly dilated in size, with normal systolic  function.        Left Atrium    The left atrium is moderately dilated in size.     Right Atrium    The right atrium is mildly dilated in size.        Aortic Valve    The aortic valve is probably trileaflet with moderately thickened leaflets  with moderately reduced excursion. There is no significant aortic  regurgitation. There is no evidence of a significant transvalvular gradient.     Mitral Valve    Mitral annular calcification is present (moderate). The mitral valve  leaflets are mildly thickened with normal leaflet mobility. There is mild  mitral valve regurgitation.     Tricuspid Valve    The tricuspid valve leaflets are normal, with normal leaflet mobility. There  is mild tricuspid regurgitation. There is severe pulmonary hypertension. TR  maximum velocity: 4.2 m/s  Estimated PASP: 85 mmHg.     Pulmonic Valve    The pulmonic valve is poorly visualized, but probably normal. There is  trivial pulmonic regurgitation. There is no evidence of a significant  transvalvular gradient.        Aorta    The aorta is normal in size in the visualized segments.     Inferior Vena Cava    IVC size and inspiratory change suggest elevated right atrial pressure.  (10-20 mmHg).     Pericardium/Pleural    There is a trivial pericardial effusion.       Echo 05/2022:    Summary    1. The left ventricle is  normal in size with mildly increased wall  thickness.    2. The left ventricular systolic function is hyperdynamic, LVEF is visually  estimated at >70%.    3. The aortic valve is trileaflet with mildly thickened leaflets with mildly  reduced excursion.    4. The right ventricle is upper normal in size, with low normal systolic  function.    5. There is mild pulmonary hypertension.        Left Ventricle    The left ventricle is normal in size with mildly increased wall thickness.  The left ventricular systolic function is hyperdynamic, LVEF is visually  estimated at >70%. Left ventricular diastolic function cannot be accurately  assessed.     Right Ventricle    The right ventricle is upper normal in size, with low normal systolic  function.        Left Atrium    The left atrium is mildly dilated in size.     Right Atrium    The right atrium is mildly dilated in size.        Aortic Valve    The aortic valve is trileaflet with mildly thickened leaflets with mildly  reduced excursion. There is no significant aortic regurgitation. There is no  evidence of a significant transvalvular gradient.     Mitral Valve    The mitral valve leaflets are mildly thickened with normal leaflet mobility.  Mitral annular calcification is present. There is trivial mitral valve  regurgitation.     Tricuspid Valve    The tricuspid valve leaflets are normal, with normal leaflet mobility. There  is mild tricuspid regurgitation. There is mild pulmonary hypertension. TR  maximum velocity: 3.0 m/s  Estimated PASP: 41 mmHg.     Pulmonic Valve    The pulmonic valve is poorly visualized, but probably normal. There is no  significant pulmonic regurgitation. There is no evidence of a significant  transvalvular gradient.        Aorta    The aorta is normal in size in the visualized segments.     Inferior Vena Cava    IVC size and inspiratory change suggest normal right atrial pressure. (0-5  mmHg).     Pericardium/Pleural    There is a trivial, circumferential pericardial effusion.       Echo 08/2020:    Summary    1. The left ventricle is normal in size with mildly increased wall  thickness.    2. The left ventricular systolic function is normal, LVEF is visually  estimated at 60-65%.    3. There is grade II diastolic dysfunction (elevated filling pressure).    4. Mitral annular calcification is present.    5. The aortic valve is probably trileaflet with mildly thickened leaflets  with normal excursion.    6. The left atrium is mildly to moderately dilated in size.    7. The right ventricle is normal in size, with normal systolic function.    8. The right atrium is mildly dilated  in size.        Left Ventricle    The left ventricle is normal in size with mildly increased wall thickness.    The left ventricular systolic function is normal, LVEF is visually estimated  at 60-65%.    There is grade II diastolic dysfunction (elevated filling pressure).     Right Ventricle    The right ventricle is normal in size, with normal systolic function.        Left  Atrium    The left atrium is mildly to moderately dilated in size.     Right Atrium    The right atrium is mildly dilated  in size.        Aortic Valve    The aortic valve is probably trileaflet with mildly thickened leaflets with  normal excursion.    There is no significant aortic regurgitation.    There is no evidence of a significant transvalvular gradient.    Peak transvalvular velocity:  1.5 m/s.     Pulmonic Valve    The pulmonic valve is normal.    There is no significant pulmonic regurgitation.    There is no evidence of a significant transvalvular gradient.     Mitral Valve    The mitral valve leaflets are poorly visualized but probably normal with  normal leaflet mobility.    Mitral annular calcification is present.    There is no significant mitral valve regurgitation.     Tricuspid Valve    The tricuspid valve leaflets are normal, with normal leaflet mobility.    There is trivial tricuspid regurgitation.    Pulmonary systolic pressure cannot be estimated due to insufficient TR jet.        Pericardium/Pleural    There is no pericardial effusion.     Inferior Vena Cava    IVC size and inspiratory change suggest normal right atrial pressure. (0-5  mmHg).     Aorta    The aorta is normal in size in the visualized segments.       Echo 07/2019:    Summary    1. The left ventricle is normal in size with normal wall thickness.    2. The left ventricular systolic function is normal, LVEF is visually  estimated at > 55%.    3. There is grade II diastolic dysfunction (elevated filling pressure).    4. The mitral valve leaflets are mildly thickened with normal leaflet  mobility.    5. Mitral annular calcification is present.    6. The left atrium is mildly dilated in size.    7. The right ventricle is normal in size, with normal systolic function.        Left Ventricle    The left ventricle is normal in size with normal wall thickness.    The left ventricular systolic function is normal, LVEF is visually estimated  at > 55%.    There is grade II diastolic dysfunction (elevated filling pressure).    LV global longitudinal strain: -20.1 %.     Right Ventricle    The right ventricle is normal in size, with normal systolic function.        Left Atrium    The left atrium is mildly dilated in size.     Right Atrium    The right atrium is upper normal  in size.        Aortic Valve    The aortic valve is trileaflet with normal appearing leaflets with normal  excursion.    There is trivial aortic regurgitation.    There is no evidence of a significant transvalvular gradient.     Pulmonic Valve    The pulmonic valve is normal.    There is no significant pulmonic regurgitation.    There is no evidence of a significant transvalvular gradient.     Mitral Valve    The mitral valve leaflets are mildly thickened with normal leaflet mobility.    Mitral annular calcification  is present.    There is trivial mitral valve regurgitation.     Tricuspid Valve    The tricuspid valve leaflets are normal, with normal leaflet mobility.    There is no significant tricuspid regurgitation.    Pulmonary systolic pressure cannot be estimated due to insufficient TR jet.        Pericardium/Pleural    There is no pericardial effusion.     Inferior Vena Cava    IVC size and inspiratory change suggest normal right atrial pressure. (0-5  mmHg).     Aorta    The aorta is normal in size in the visualized segments.       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      Echo performed on 02/03/15:     Left Ventricle  There is normal left ventricular chamber size, wall thickness and  contraction.  The visually estimated left ventricular ejection fraction is 60-65%.  Diastolic transmitral flow profile and mitral annular tissue Doppler  signal characteristic of impaired left ventricular relaxation with  elevated filling pressures.  Mitral Valve  There is annular calcification and the mitral leaflets are mildly  thickened with normal mobility.  There is no echocardiographic evidence of pathologic mitral  regurgitation by color flow or continuous wave Doppler imaging.  Left Atrium  The left atrium is mildly dilated.  Aortic Valve  Suboptimally imaged aortic valve is mildly thickened.  There is no echocardiographic evidence of aortic regurgitation by  color flow or continuous wave Doppler examinations.  There is no evidence of hemodynamically important aortic transvalvular  gradient; the maximal instantaneous left ventricular outflow velocity  is 1.5 m/s.  Aorta  The thoracic aorta is normal in diameter at the level of the left  ventricular outflow tract, sinuses of Valsalva, sinotubular junction  and transverse arch.  Pulmonary Artery  The pulmonary artery is suboptimally imaged.  Pulmonic Valve  The pulmonary valve is suboptimally imaged.  There is trivial pulmonary regurgitation by color flow and continuous  wave Doppler imaging.  There is no evidence of hemodynamically important pulmonary  transvalvular gradient; the maximal instantaneous right ventricular  outflow velocity is approximately 1.2 m/s.  Right Ventricle  There is normal right ventricular chamber size, wall thickness and  contraction.  Tricuspid Valve  The tricuspid valve is structurally normal.  There is trivial tricuspid regurgitation by color flow and continuous  wave Doppler imaging.  The peak instantaneous tricuspid regurgitant jet velocity is 3.3 m/s  characteristic of moderately elevated right ventricular systolic  pressure (44 mm Hg).  Right Atrium  The right atrium is normal in size.  Inferior Vena Cava  The inferior vena cava is suboptimally imaged.  Pericardium  There is no evidence of pericardial effusion.

## 2023-11-04 NOTE — Unmapped (Signed)
 Thank you for visiting the [facility].    If you have any questions or problems between visits, please call:    [phone]    After hours, please call the Hospital Operator at [phone] and ask for the pulmonary fellow on call.

## 2023-11-07 NOTE — Unmapped (Signed)
 [facility] Home Delivery Pharmacy Clinical Assessment & Refill Coordination Note    Erin Welch, [place]: 1950-07-16  Phone: There are no phone numbers on file.    All above HIPAA information was verified with patient's family member, husband.     Was a Nurse, learning disability used for this call? No    Specialty Medication(s):   Hematology/Oncology: Aranesp     Current Outpatient Medications   Medication Sig Dispense Refill    acetaminophen (TYLENOL 8 HOUR) 650 MG CR tablet Take 1 tablet (650 mg total) by mouth nightly.      albuterol HFA 90 mcg/actuation inhaler Inhale 2 puffs every six (6) hours as needed for wheezing or shortness of breath. 1 Inhaler 0    allopurinol (ZYLOPRIM) 100 MG tablet TAKE 2 TABLETS BY MOUTH IN THE MORNING 180 tablet 3    amlodipine (NORVASC) 5 MG tablet Take 1 tablet by mouth once daily 90 tablet 0    aspirin (ECOTRIN) 81 MG tablet Take 1 tablet (81 mg total) by mouth daily.      carvedilol (COREG) 25 MG tablet Take 1 tablet by mouth twice daily 180 tablet 0    cephalexin (KEFLEX) 250 MG capsule Take 1 capsule by mouth once daily 90 capsule 3    cholecalciferol, vitamin D3 25 mcg, 1,000 units,, 1,000 unit (25 mcg) tablet Take 1 tablet (25 mcg total) by mouth daily. (Patient taking differently: Take 1 tablet (25 mcg total) by mouth two (2) times a day.) 90 tablet 3    colchicine (COLCRYS) 0.6 mg tablet Take 1 tablet (0.6 mg total) by mouth two (2) times a day as needed. For acute gout, take 2 tablets by mouth, then 1 tablet one hour later on first day. May continue 1 tablet up to twice a day as needed.      darbepoetin alfa-polysorbate (ARANESP) 300 mcg/0.6 mL Syrg Inject 0.6 mL (300 mcg total) under the skin every twenty-eight (28) days. 0.6 mL 11    eplerenone (INSPRA) 25 MG tablet Take 1 tablet (25 mg total) by mouth daily.      fluticasone propionate (FLONASE) 50 mcg/actuation nasal spray Use 1 spray(s) in each nostril twice daily (Patient taking differently: 1 spray into each nostril daily as needed.) 16 g 11    furosemide (LASIX) 40 MG tablet Take 1 tablet (40 mg total) by mouth two (2) times a day. (Patient taking differently: Take 2 tablets (80 mg total) by mouth daily.) 180 tablet 3    glucosamine-chondroitin 500-400 mg tablet Take 1 tablet by mouth Two (2) times a day.      hydroxyurea (HYDREA) 500 mg capsule Take 3 capsules (1500 mg total) every other day, with 2 capsules (1000 mg total) on the other days. 270 capsule 1    JARDIANCE 10 mg tablet Take 1 tablet by mouth once daily 90 tablet 0    losartan (COZAAR) 50 MG tablet Take 1 tablet by mouth once daily 90 tablet 0    MEDICAL SUPPLY ITEM 14 cm into each nostril nightly. CPAP Auto titrating machine 14cmH2O 3PR  [employer] is patient's DME provider.      melatonin 10 mg Tab Take 1 tablet (10 mg total) by mouth nightly.      [Paused] metFORMIN (GLUCOPHAGE) 500 MG tablet Take 1 tablet (500 mg total) by mouth in the morning and 1 tablet (500 mg total) in the evening. Take with meals. 180 tablet 3    MYFORTIC 180 mg EC tablet Take 2 tablets (  360 mg total) by mouth two (2) times a day. 120 tablet 11    OMEGA-3/DHA/EPA/FISH OIL (FISH OIL-OMEGA-3 FATTY ACIDS) 300-1,000 mg capsule Take 1 capsule (1 g total) by mouth daily.      pantoprazole (PROTONIX) 40 MG tablet Take 1 tablet by mouth once daily 90 tablet 0    pravastatin (PRAVACHOL) 40 MG tablet Take 1 tablet by mouth once daily 90 tablet 3    PROGRAF 0.5 mg capsule Take 2 capsules (1 mg total) by mouth in the morning. Take 1 mg in morning and 0.5 mg in evening.      sodium bicarbonate 650 mg tablet Take 1 tablet (650 mg total) by mouth two (2) times a day. NEW DOSE      umeclidinium (INCRUSE ELLIPTA) 62.5 mcg/actuation inhaler Inhale 1 puff by mouth once daily 90 each 3    zolpidem (AMBIEN) 5 MG tablet Take 1 tablet (5 mg total) by mouth nightly as needed. 30 tablet 3     No current facility-administered medications for this visit.        Changes to medications: [name] reports no changes at this time.    Medication list has been reviewed and updated in Epic:  last 4/28    Allergies   Allergen Reactions    Macrobid [Nitrofurantoin Monohyd/M-Cryst] Other (See Comments)     Duplicate    Nitrofurantoin Nausea And Vomiting       Changes to allergies: No    Allergies have been reviewed and updated in Epic:  last 4/28    SPECIALTY MEDICATION ADHERENCE     Aranesp 300/0.6  mcg/ml : 0 doses of medicine on hand       Medication Adherence    Patient reported X missed doses in the last month: 0  Specialty Medication: Aranesp  Informant: spouse          Specialty medication(s) dose(s) confirmed: Regimen is correct and unchanged.     Are there any concerns with adherence? No    Adherence counseling provided? Not needed    CLINICAL MANAGEMENT AND INTERVENTION      Clinical Benefit Assessment:    Do you feel the medicine is effective or helping your condition? Yes    Clinical Benefit counseling provided? Not needed    Adverse Effects Assessment:    Are you experiencing any side effects? No    Are you experiencing difficulty administering your medicine? No    Quality of Life Assessment:    Quality of Life    Rheumatology  Oncology  Dermatology  Cystic Fibrosis          How many days over the past month did your condition  keep you from your normal activities? For example, brushing your teeth or getting up in the morning. 0    Have you discussed this with your provider? Not needed    Acute Infection Status:    Acute infections noted within Epic:  No active infections    Patient reported infection: None    Therapy Appropriateness:    Is therapy appropriate based on current medication list, adverse reactions, adherence, clinical benefit and progress toward achieving therapeutic goals? Yes, therapy is appropriate and should be continued     Clinical Intervention:    Was an intervention completed as part of this clinical assessment? No    DISEASE/MEDICATION-SPECIFIC INFORMATION      For patients on injectable medications: Patient currently has 0 doses left.  Next injection is scheduled for in a week.  Oncology: Is the patient receiving adequate infection prevention treatment? Not applicable  Does the patient have adequate nutritional support? Not applicable    PATIENT SPECIFIC NEEDS     Does the patient have any physical, cognitive, or cultural barriers? No    Is the patient high risk? No    Does the patient require physician intervention or other additional services (i.e., nutrition, smoking cessation, social work)? No    Does the patient have an additional or emergency contact listed in their chart? Yes    SOCIAL DETERMINANTS OF HEALTH     At the [facility], we have learned that life circumstances - like trouble affording food, housing, utilities, or transportation can affect the health of many of our patients.   That is why we wanted to ask: are you currently experiencing any life circumstances that are negatively impacting your health and/or quality of life? No    Social Drivers of Health     Food Insecurity: No Food Insecurity (03/03/2023)    Hunger Vital Sign     Worried About Running Out of Food in the Last Year: Never true     Ran Out of Food in the Last Year: Never true   Tobacco Use: Medium Risk (08/12/2023)    Patient History     Smoking Tobacco Use: Former     Smokeless Tobacco Use: Never     Passive Exposure: Not on file   Transportation Needs: No Transportation Needs (03/03/2023)    PRAPARE - Therapist, art (Medical): No     Lack of Transportation (Non-Medical): No   Alcohol Use: Not on file   Housing: Low Risk  (03/03/2023)    Housing     Within the past 12 months, have you ever stayed: outside, in a car, in a tent, in an overnight shelter, or temporarily in someone else's home (i.e. couch-surfing)?: No     Are you worried about losing your housing?: No   Physical Activity: Not on file   Utilities: Low Risk  (03/03/2023)    Utilities     Within the past 12 months, have you been unable to get utilities (heat, electricity) when it was really needed?: No   Stress: Not on file   Interpersonal Safety: Not on file   Substance Use: Not on file (05/16/2023)   Intimate Partner Violence: Not on file   Social Connections: Not on file   Financial Resource Strain: Low Risk  (03/03/2023)    Overall Financial Resource Strain (CARDIA)     Difficulty of Paying Living Expenses: Not hard at all   Health Literacy: Not on file   Internet Connectivity: Not on file       Would you be willing to receive help with any of the needs that you have identified today? Not applicable       SHIPPING     Specialty Medication(s) to be Shipped:   Hematology/Oncology: Aranesp    Other medication(s) to be shipped: No additional medications requested for fill at this time     Changes to insurance: No    Cost and Payment: Patient has a $0 copay, payment information is not required.    Delivery Scheduled: Yes, Expected medication delivery date: 11/08/33.     Medication will be delivered via Same Day Courier to the confirmed prescription address in [facility].    The patient will receive a drug information handout for each medication shipped and additional FDA Medication Guides as  required.  Verified that patient has previously received a Conservation officer, historic buildings and a Surveyor, mining.    The patient or caregiver noted above participated in the development of this care plan and knows that they can request review of or adjustments to the care plan at any time.      All of the patient's questions and concerns have been addressed.    Shama Monfils Dennard Fisher, PharmD   [facility] Specialty Pharmacist

## 2023-11-09 ENCOUNTER — Inpatient Hospital Stay: Admit: 2023-11-09 | Discharge: 2023-11-09 | Payer: MEDICARE

## 2023-11-09 MED ADMIN — iohexol (OMNIPAQUE) 300 mg iodine/mL solution: INTRAVENOUS | @ 17:00:00 | Stop: 2023-11-09

## 2023-11-09 MED ADMIN — lidocaine (PF) (XYLOCAINE-MPF) 20 mg/mL (2 %) injection: @ 17:00:00 | Stop: 2023-11-09

## 2023-11-09 MED ADMIN — heparin (porcine) in NS Manifold Flush: @ 17:00:00 | Stop: 2023-11-09

## 2023-11-09 MED ADMIN — fentaNYL (PF) (SUBLIMAZE) injection: INTRAVENOUS | @ 17:00:00 | Stop: 2023-11-09

## 2023-11-09 MED FILL — ARANESP 300 MCG/0.6 ML (IN POLYSORBATE) INJECTION SYRINGE: SUBCUTANEOUS | 28 days supply | Qty: 0.6 | Fill #1

## 2023-11-09 NOTE — Unmapped (Signed)
 Cardiac Catheterization Laboratory  [facility]   [place], [place]  Tel: [phone]     Fax: [phone]    PRELIMINARY CARDIAC CATHETERIZATION REPORT  (Full Report to Follow within 72 hours)  ____________________________________________________________________________    Findings:  Pulomnary hypertension with mean PA pressure of 36 mm Hg  Minimal SVC/RA stenosis with no pressure gradient    Recommendations:    Per primary cardiologist       ____________________________________________________________________________     Date of Cardiac Procedure: 11/09/2023    Pre-op Diagnosis: OSA on CPAP [G47.33]  Heart failure with preserved left ventricular function (HFpEF) [I50.30]  Dyspnea, unspecified type [R06.00]  Kidney 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 [Z94.0]  Pulmonary hypertension [I27.20]    Post-op Diagnosis: same       Performing Service: Cardiology  Surgeons and Role:     * Issachar Broady, Westly Hammersmith, MD - Primary     * Scarfone, Loreta Rome, MD - Fellow - Diagnostic    Procedures performed:  Right Heart Catheterization    Venous Access Site:  Right femoral vein    Right Heart Catheterization     RA mean: 0 mmHg    RV systolic: 65 mmHg    RV end diastolic: 3 mmHg    PA (mean): 65/17 (36) mmHg    PCWP mean: 8 mmHg    RA Sat: 66%  PA Sat: 67 %  Art Sat: 100 % (by pulse oximetry)    Fick Cardiac output: 5.5 L/min  Fick Cardiac index: 3.0 L/min/m2  Thermo Cardiac output: 5.0 L/min  Thermo Cardiac index: 2.7 L/min/m2    I was present during the entire procedure     Deadra Everts MD

## 2023-11-10 NOTE — Unmapped (Signed)
 Pt had cardiac cath yesterday. She will hydrate well today and repeat labs on Monday to check her kidney function, she agreed

## 2023-11-11 MED ORDER — SILDENAFIL (PULMONARY HYPERTENSION) 20 MG TABLET
ORAL_TABLET | Freq: Three times a day (TID) | ORAL | 11 refills | 30.00000 days | Status: CP
Start: 2023-11-11 — End: 2024-11-10

## 2023-11-14 ENCOUNTER — Ambulatory Visit: Admit: 2023-11-14 | Discharge: 2023-11-15 | Payer: MEDICARE

## 2023-11-14 DIAGNOSIS — Z79899 Other long term (current) drug therapy: Principal | ICD-10-CM

## 2023-11-14 DIAGNOSIS — Z940 Kidney transplant status: Principal | ICD-10-CM

## 2023-11-14 LAB — CBC W/ AUTO DIFF
BASOPHILS ABSOLUTE COUNT: 0 10*9/L (ref 0.0–0.1)
BASOPHILS RELATIVE PERCENT: 0.6 %
EOSINOPHILS ABSOLUTE COUNT: 0 10*9/L (ref 0.0–0.5)
EOSINOPHILS RELATIVE PERCENT: 1 %
HEMATOCRIT: 29.8 % — ABNORMAL LOW (ref 34.0–44.0)
HEMOGLOBIN: 9.9 g/dL — ABNORMAL LOW (ref 11.3–14.9)
LYMPHOCYTES ABSOLUTE COUNT: 0.4 10*9/L — ABNORMAL LOW (ref 1.1–3.6)
LYMPHOCYTES RELATIVE PERCENT: 10.1 %
MEAN CORPUSCULAR HEMOGLOBIN CONC: 33.4 g/dL (ref 32.0–36.0)
MEAN CORPUSCULAR HEMOGLOBIN: 39.9 pg — ABNORMAL HIGH (ref 25.9–32.4)
MEAN CORPUSCULAR VOLUME: 119.3 fL — ABNORMAL HIGH (ref 77.6–95.7)
MEAN PLATELET VOLUME: 7.9 fL (ref 6.8–10.7)
MONOCYTES ABSOLUTE COUNT: 0.3 10*9/L (ref 0.3–0.8)
MONOCYTES RELATIVE PERCENT: 7.4 %
NEUTROPHILS ABSOLUTE COUNT: 3.4 10*9/L (ref 1.8–7.8)
NEUTROPHILS RELATIVE PERCENT: 80.9 %
PLATELET COUNT: 664 10*9/L — ABNORMAL HIGH (ref 150–450)
RED BLOOD CELL COUNT: 2.49 10*12/L — ABNORMAL LOW (ref 3.95–5.13)
RED CELL DISTRIBUTION WIDTH: 19.3 % — ABNORMAL HIGH (ref 12.2–15.2)
WBC ADJUSTED: 4.2 10*9/L (ref 3.6–11.2)

## 2023-11-14 LAB — RENAL FUNCTION PANEL
ALBUMIN: 3.7 g/dL (ref 3.4–5.0)
ANION GAP: 11 mmol/L (ref 5–14)
BLOOD UREA NITROGEN: 55 mg/dL — ABNORMAL HIGH (ref 9–23)
BUN / CREAT RATIO: 25
CALCIUM: 10.2 mg/dL (ref 8.7–10.4)
CHLORIDE: 106 mmol/L (ref 98–107)
CO2: 24 mmol/L (ref 20.0–31.0)
CREATININE: 2.16 mg/dL — ABNORMAL HIGH (ref 0.55–1.02)
EGFR CKD-EPI (2021) FEMALE: 24 mL/min/1.73m2 — ABNORMAL LOW (ref >=60)
GLUCOSE RANDOM: 295 mg/dL — ABNORMAL HIGH (ref 70–179)
PHOSPHORUS: 3.6 mg/dL (ref 2.4–5.1)
POTASSIUM: 4.3 mmol/L (ref 3.4–4.8)
SODIUM: 141 mmol/L (ref 135–145)

## 2023-11-14 LAB — TACROLIMUS LEVEL, TROUGH: TACROLIMUS, TROUGH: 4.5 ng/mL — ABNORMAL LOW (ref 5.0–15.0)

## 2023-11-14 LAB — MAGNESIUM: MAGNESIUM: 2 mg/dL (ref 1.6–2.6)

## 2023-11-14 NOTE — Unmapped (Signed)
 Creatinine 2.1 after cardiac cath with contrast, reviewed with Dr  Ferman Houston, will repeat in the am.

## 2023-11-14 NOTE — Unmapped (Signed)
 Sildenafil approved, media uploaded in chart

## 2023-11-15 ENCOUNTER — Ambulatory Visit: Admit: 2023-11-15 | Discharge: 2023-11-16 | Payer: MEDICARE

## 2023-11-15 LAB — CBC W/ AUTO DIFF
BASOPHILS ABSOLUTE COUNT: 0 10*9/L (ref 0.0–0.1)
BASOPHILS RELATIVE PERCENT: 0.6 %
EOSINOPHILS ABSOLUTE COUNT: 0 10*9/L (ref 0.0–0.5)
EOSINOPHILS RELATIVE PERCENT: 0.9 %
HEMATOCRIT: 29.2 % — ABNORMAL LOW (ref 34.0–44.0)
HEMOGLOBIN: 10 g/dL — ABNORMAL LOW (ref 11.3–14.9)
LYMPHOCYTES ABSOLUTE COUNT: 0.4 10*9/L — ABNORMAL LOW (ref 1.1–3.6)
LYMPHOCYTES RELATIVE PERCENT: 9.1 %
MEAN CORPUSCULAR HEMOGLOBIN CONC: 34.2 g/dL (ref 32.0–36.0)
MEAN CORPUSCULAR HEMOGLOBIN: 41 pg — ABNORMAL HIGH (ref 25.9–32.4)
MEAN CORPUSCULAR VOLUME: 119.8 fL — ABNORMAL HIGH (ref 77.6–95.7)
MEAN PLATELET VOLUME: 7.8 fL (ref 6.8–10.7)
MONOCYTES ABSOLUTE COUNT: 0.4 10*9/L (ref 0.3–0.8)
MONOCYTES RELATIVE PERCENT: 9.8 %
NEUTROPHILS ABSOLUTE COUNT: 3.4 10*9/L (ref 1.8–7.8)
NEUTROPHILS RELATIVE PERCENT: 79.6 %
PLATELET COUNT: 634 10*9/L — ABNORMAL HIGH (ref 150–450)
RED BLOOD CELL COUNT: 2.44 10*12/L — ABNORMAL LOW (ref 3.95–5.13)
RED CELL DISTRIBUTION WIDTH: 18.5 % — ABNORMAL HIGH (ref 12.2–15.2)
WBC ADJUSTED: 4.3 10*9/L (ref 3.6–11.2)

## 2023-11-15 LAB — RENAL FUNCTION PANEL
ALBUMIN: 3.6 g/dL (ref 3.4–5.0)
ANION GAP: 10 mmol/L (ref 5–14)
BLOOD UREA NITROGEN: 59 mg/dL — ABNORMAL HIGH (ref 9–23)
BUN / CREAT RATIO: 30
CALCIUM: 10 mg/dL (ref 8.7–10.4)
CHLORIDE: 105 mmol/L (ref 98–107)
CO2: 24 mmol/L (ref 20.0–31.0)
CREATININE: 1.97 mg/dL — ABNORMAL HIGH (ref 0.55–1.02)
EGFR CKD-EPI (2021) FEMALE: 26 mL/min/1.73m2 — ABNORMAL LOW (ref >=60)
GLUCOSE RANDOM: 190 mg/dL — ABNORMAL HIGH (ref 70–99)
PHOSPHORUS: 3.7 mg/dL (ref 2.4–5.1)
POTASSIUM: 4.2 mmol/L (ref 3.4–4.8)
SODIUM: 139 mmol/L (ref 135–145)

## 2023-11-15 NOTE — Unmapped (Signed)
 Creatinine from yesterday was 2.1. Reviewed with Dr. Ferman Houston, repeat today. Creatinine now 1.9. She is cleared to leave town as planned and will repeat labs when she gets back in 10 days

## 2023-11-28 NOTE — Unmapped (Signed)
 [facility] Special[facility] and Home Delivery Pharmacy Refill Coordination Note    Erin Welch, [place]: 1951-05-20  Phone: There are no phone numbers on file.      All above HIPAA information was verified with patient.         11/28/2023    10:02 AM   Specialty Rx Medication Refill Questionnaire   Which Medications would you like refilled and shipped? ARANESP   Please list all current allergies: Nitrofurantoin   Have you missed any doses in the last 30 days? No   Have you had any changes to your medication(s) since your last refill? No   How many days remaining of each medication do you have at home? 0   Have you experienced any side effects in the last 30 days? No   Please enter the full address (street address, city, state, zip code) where you would like your medication(s) to be delivered to. [address] [place], [address], [place], [place] [address]   Please specify on which day you would like your medication(s) to arrive. Note: if you need your medication(s) within 3 days, please call the pharmacy to schedule your order at [phone]  11/07/2023   Has your insurance changed since your last refill? No   Would you like a pharmacist to call you to discuss your medication(s)? No   Do you require a signature for your package? (Note: if we are billing Medicare Part B or your order contains a controlled substance, we will require a signature) No   I have been provided my out of pocket cost for my medication and approve the pharmacy to charge the amount to my credit card on file. Yes         Completed refill call assessment today to schedule patient's medication shipment from the [facility] Delivery [facility] [phone]).  All relevant notes have been reviewed.       Confirmed patient received a Conservation officer, historic buildings and a Surveyor, mining with first shipment. The patient will receive a drug information handout for each medication shipped and additional FDA Medication Guides as required.         REFERRAL TO PHARMACIST     Referral to the pharmacist: Not needed      [facility]     Shipping address confirmed in Epic.     Delivery Scheduled: Yes, Expected medication delivery date: 12/07/23.     Medication will be delivered via Same Day Courier to the prescription address in Epic [facility].    Alden Bensinger   [facility] and Home Delivery Pharm[facility] Specialty Technician

## 2023-11-30 DIAGNOSIS — J449 Chronic obstructive pulmonary disease, unspecified: Principal | ICD-10-CM

## 2023-11-30 MED ORDER — CARVEDILOL 25 MG TABLET
ORAL_TABLET | ORAL | 0 refills | 0.00000 days
Start: 2023-11-30 — End: ?

## 2023-11-30 MED ORDER — LOSARTAN 50 MG TABLET
ORAL_TABLET | Freq: Every day | ORAL | 0 refills | 0.00000 days
Start: 2023-11-30 — End: ?

## 2023-11-30 MED ORDER — PANTOPRAZOLE 40 MG TABLET,DELAYED RELEASE
ORAL_TABLET | Freq: Every day | ORAL | 0 refills | 0.00000 days
Start: 2023-11-30 — End: ?

## 2023-11-30 MED ORDER — INCRUSE ELLIPTA 62.5 MCG/ACTUATION POWDER FOR INHALATION
RESPIRATORY_TRACT | 0 refills | 0.00000 days
Start: 2023-11-30 — End: ?

## 2023-11-30 MED ORDER — AMLODIPINE 5 MG TABLET
ORAL_TABLET | Freq: Every day | ORAL | 0 refills | 0.00000 days
Start: 2023-11-30 — End: ?

## 2023-11-30 MED ORDER — JARDIANCE 10 MG TABLET
ORAL_TABLET | Freq: Every day | ORAL | 0 refills | 90.00000 days | Status: CP
Start: 2023-11-30 — End: ?

## 2023-11-30 NOTE — Unmapped (Signed)
 Refill request received for patient.      Medication Requested: Evone Hoh  Last Office Visit: 08/12/2023   Next Office Visit: 02/17/2024  Last Prescriber: Hinderliter    Nurse refill requirements met? Yes  If not met, why:     Sent to: Pharmacy per protocol  If sent to provider, which provider?:

## 2023-12-01 MED ORDER — AMLODIPINE 5 MG TABLET
ORAL_TABLET | Freq: Every day | ORAL | 3 refills | 90.00000 days | Status: CP
Start: 2023-12-01 — End: ?

## 2023-12-01 MED ORDER — LOSARTAN 50 MG TABLET
ORAL_TABLET | Freq: Every day | ORAL | 3 refills | 90.00000 days | Status: CP
Start: 2023-12-01 — End: ?

## 2023-12-01 MED ORDER — PANTOPRAZOLE 40 MG TABLET,DELAYED RELEASE
ORAL_TABLET | Freq: Every day | ORAL | 3 refills | 90.00000 days | Status: CP
Start: 2023-12-01 — End: ?

## 2023-12-01 MED ORDER — CARVEDILOL 25 MG TABLET
ORAL_TABLET | ORAL | 3 refills | 0.00000 days | Status: CP
Start: 2023-12-01 — End: ?

## 2023-12-01 MED ORDER — INCRUSE ELLIPTA 62.5 MCG/ACTUATION POWDER FOR INHALATION
RESPIRATORY_TRACT | 0 refills | 0.00000 days | Status: CP
Start: 2023-12-01 — End: ?

## 2023-12-07 MED FILL — ARANESP 300 MCG/0.6 ML (IN POLYSORBATE) INJECTION SYRINGE: SUBCUTANEOUS | 28 days supply | Qty: 0.6 | Fill #2

## 2023-12-14 NOTE — Unmapped (Signed)
 [facility] Geriatrics Clinic Note     Date of visit: 12/16/2023  Reason for visit: Follow-up (Pulmonary hypotension )  Patient's PCP: Lissa Riding, MD    Assessment/Plan:  73 y.o. female with chronic hypoxia, pulmonary hypertension, essential thrombocytosis, s/p DDKT, hypertension who is seen today for follow up. Our first visit today.     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 Dyann Glaser in pulmonary hypertension clinic. Breathing ok, oxygen drops on exertion. Wears 3LNC at home. Tried sildenafil but did not tolerate and stopped 2-3 weeks ago. Things are stable at this time. Looks euvolemic on current lasix dosing.  -underwent RHC with PH with mean PA pressure 36mm, minimal SVC/RA stenosis   -continue incruse   -diuretic- lasix 80mg  daily     Essential thrombocytosis: follows with heme, last seen 3/31. On hydrea. Following with Dr. Leveda Rea.  -continue hydrea and hematology follow up  -check CBC w diff today     S/p deceased donor kidney transplant 2014: follows with Dr. Ferman Houston in transplant nephrology. Last creatinine 2.   -continue tacrolimus per Dr. Ferman Houston  -myfortic 360mg  BID  -check labs today     History of recurrent UTI:   -on keflex 250mg  daily    Hypertension: usually well controlled with systolics 120s or 130s  -on eplerenone 25mg  daily  -losartan 50mg  daily  -furosemide 80mg  daily   -amlodipine 5mg  daily  -carvedilol 25mg  BID    T2DM:   -last A1c 8.4, recheck today  -on jardiance 10mg  daily   -she is off the metformin     Right middle lobe nodule, probably stage 1 lung cancer:   -she was treated with radiation therapy x 3 fractions, completed 07/2020  -CT chest stable 07/2023    HLD:   -on pravastatin 40mg  daily  -check lipid panel     Gout:   -allopurinol 100mg  daily     Adrenal adenoma: noted, no plans for intervention    Mobility: uses a scooter to get around. Immobility is due to lung disease, knee pain, weakness and leg buckling. Off an d on for several years.     Vaccines:   -COVID booster today   Discuss preventative measures at next visit    RTC 3 months for follow up    Follow up: Return in about 3 months (around 03/17/2024) for Recheck.  Future Appointments   Date Time Provider Department Center   12/20/2023  1:30 PM Donavon Fudge, MD DERMMRKT [facility]   12/29/2023  2:30 PM [facility]  RM 1 IMGUSET [place] - [facility]   12/29/2023  3:30 PM Detwiler, Hampton Levins, MD UNCKIDTRSET [facility]   01/16/2024 10:45 AM ADULT ONC LAB [facility]   01/16/2024 11:45 AM Worthy Heads, MD HONC2UCA [facility]   01/16/2024  1:00 PM Deneen Finical, CPP HONC2UCA [facility]   01/24/2024  2:00 PM [facility] CT RM 4 ICTUNH [place]   01/24/2024  3:00 PM Fernanda Howell, MD [facility]   01/25/2024  8:15 AM Eugenia Hess, MD DERMMOHSMRKT [facility]   02/17/2024  2:40 PM Hinderliter, Almon Jaegers, MD [facility]   03/21/2024  3:15 PM Rayce Brahmbhatt, Carlyon Chill, MD UNCGERIATET [facility]   05/08/2024  2:00 PM Conchetta Deeds, MD UNCPULSPCLET [facility]       -------------------------------------------------------------------------------    HPI:  73 y.o. female with chronic hypoxia, pulmonary hypertension, essential thrombocytosis, s/p DDKT, hypertension who is seen today for follow up. Our  first visit today.     [place] up in [place], then [place]    With her husband for 57 years  3 sons, 8 grandkids, 7 great grandkids   2 sons live here, one in [place]     Doing ok     Breathing is stable   Wearing 3LNC now     Tried sildenafil, bad side effects    ET is stable on hydrea     No issues with tac or myfortic right now     Uses scooter to get around     who presents for Follow-up (Pulmonary hypotension )        Medications:  Outpatient Medications Prior to Visit   Medication Sig Dispense Refill    acetaminophen (TYLENOL 8 HOUR) 650 MG CR tablet Take 1 tablet (650 mg total) by mouth nightly.      albuterol HFA 90 mcg/actuation inhaler Inhale 2 puffs every six (6) hours as needed for wheezing or shortness of breath. 1 Inhaler 0    allopurinol (ZYLOPRIM) 100 MG tablet TAKE 2 TABLETS BY MOUTH IN THE MORNING 180 tablet 3    amlodipine (NORVASC) 5 MG tablet Take 1 tablet by mouth once daily 90 tablet 3    aspirin (ECOTRIN) 81 MG tablet Take 1 tablet (81 mg total) by mouth daily.      carvedilol (COREG) 25 MG tablet Take 1 tablet by mouth twice daily 180 tablet 3    cephalexin (KEFLEX) 250 MG capsule Take 1 capsule by mouth once daily 90 capsule 3    cholecalciferol, vitamin D3 25 mcg, 1,000 units,, 1,000 unit (25 mcg) tablet Take 1 tablet (25 mcg total) by mouth daily. (Patient taking differently: Take 1 tablet (25 mcg total) by mouth two (2) times a day.) 90 tablet 3    colchicine (COLCRYS) 0.6 mg tablet Take 1 tablet (0.6 mg total) by mouth two (2) times a day as needed. For acute gout, take 2 tablets by mouth, then 1 tablet one hour later on first day. May continue 1 tablet up to twice a day as needed.      darbepoetin alfa-polysorbate (ARANESP) 300 mcg/0.6 mL Syrg Inject 0.6 mL (300 mcg total) under the skin every twenty-eight (28) days. 0.6 mL 11    eplerenone (INSPRA) 25 MG tablet Take 1 tablet (25 mg total) by mouth daily.      fluticasone propionate (FLONASE) 50 mcg/actuation nasal spray Use 1 spray(s) in each nostril twice daily (Patient taking differently: 1 spray into each nostril daily as needed.) 16 g 11    furosemide (LASIX) 40 MG tablet Take 1 tablet (40 mg total) by mouth two (2) times a day. (Patient taking differently: Take 2 tablets (80 mg total) by mouth daily.) 180 tablet 3    glucosamine-chondroitin 500-400 mg tablet Take 1 tablet by mouth Two (2) times a day.      hydroxyurea (HYDREA) 500 mg capsule Take 3 capsules (1500 mg total) every other day, with 2 capsules (1000 mg total) on the other days. 270 capsule 1    INCRUSE ELLIPTA 62.5 mcg/actuation inhaler Inhale 1 puff by mouth once daily 90 each 0    JARDIANCE 10 mg tablet Take 1 tablet by mouth once daily 90 tablet 0    losartan (COZAAR) 50 MG tablet Take 1 tablet by mouth once daily 90 tablet 3    MEDICAL SUPPLY ITEM 14 cm into each nostril nightly. CPAP Auto titrating machine 14cmH2O 3PR  [employer] is patient's DME provider.  melatonin 10 mg Tab Take 1 tablet (10 mg total) by mouth nightly.      metFORMIN (GLUCOPHAGE) 500 MG tablet Take 1 tablet (500 mg total) by mouth in the morning and 1 tablet (500 mg total) in the evening. Take with meals. 180 tablet 3    MYFORTIC 180 mg EC tablet Take 2 tablets (360 mg total) by mouth two (2) times a day. 120 tablet 11    OMEGA-3/DHA/EPA/FISH OIL (FISH OIL-OMEGA-3 FATTY ACIDS) 300-1,000 mg capsule Take 1 capsule (1 g total) by mouth daily.      pantoprazole (PROTONIX) 40 MG tablet Take 1 tablet by mouth once daily 90 tablet 3    pravastatin (PRAVACHOL) 40 MG tablet Take 1 tablet by mouth once daily 90 tablet 3    PROGRAF 0.5 mg capsule Take 2 capsules (1 mg total) by mouth in the morning. Take 1 mg in morning and 0.5 mg in evening.      sodium bicarbonate 650 mg tablet Take 1 tablet (650 mg total) by mouth two (2) times a day. NEW DOSE      zolpidem (AMBIEN) 5 MG tablet Take 1 tablet (5 mg total) by mouth nightly as needed. 30 tablet 3    sildenafiL, pulm.hypertension, (REVATIO) 20 mg tablet Take 1 tablet (20 mg total) by mouth Three (3) times a day. 90 tablet 11     No facility-administered medications prior to visit.       Physical Exam:  BP 140/58 (BP Site: L Arm, BP Position: Sitting, BP Cuff Size: Large)  - Pulse 67  - Temp 36.7 ??C (98.1 ??F) (Temporal)  - Ht 154.9 cm (5' 0.98)  - Wt 87.3 kg (192 lb 6.4 oz)  - BMI 36.37 kg/m??     Older female wearing oxygen via nasal cannula in no distress  CV: RRR, systolic murmur noted  Lungs fairly clear today   Abdomen soft and nontender  No significant LE edema     I personally spent 40 minutes face-to-face and non-face-to-face in the care of this patient, which includes all pre, intra, and post visit time on the date of service.    Fleet Huh, MD

## 2023-12-16 ENCOUNTER — Ambulatory Visit
Admit: 2023-12-16 | Discharge: 2023-12-17 | Payer: MEDICARE | Attending: Student in an Organized Health Care Education/Training Program | Primary: Student in an Organized Health Care Education/Training Program

## 2023-12-16 DIAGNOSIS — E1169 Type 2 diabetes mellitus with other specified complication: Principal | ICD-10-CM

## 2023-12-16 DIAGNOSIS — I10 Essential (primary) hypertension: Principal | ICD-10-CM

## 2023-12-16 DIAGNOSIS — I272 Pulmonary hypertension, unspecified: Principal | ICD-10-CM

## 2023-12-16 DIAGNOSIS — Z940 Kidney transplant status: Principal | ICD-10-CM

## 2023-12-16 DIAGNOSIS — E785 Hyperlipidemia, unspecified: Principal | ICD-10-CM

## 2023-12-16 DIAGNOSIS — D473 Essential (hemorrhagic) thrombocythemia: Principal | ICD-10-CM

## 2023-12-16 DIAGNOSIS — E559 Vitamin D deficiency, unspecified: Principal | ICD-10-CM

## 2023-12-16 LAB — CBC W/ AUTO DIFF
BASOPHILS ABSOLUTE COUNT: 0 10*9/L (ref 0.0–0.1)
BASOPHILS RELATIVE PERCENT: 0.3 %
EOSINOPHILS ABSOLUTE COUNT: 0 10*9/L (ref 0.0–0.5)
EOSINOPHILS RELATIVE PERCENT: 0.2 %
HEMATOCRIT: 31 % — ABNORMAL LOW (ref 34.0–44.0)
HEMOGLOBIN: 10.5 g/dL — ABNORMAL LOW (ref 11.3–14.9)
LYMPHOCYTES ABSOLUTE COUNT: 0.3 10*9/L — ABNORMAL LOW (ref 1.1–3.6)
LYMPHOCYTES RELATIVE PERCENT: 7.7 %
MEAN CORPUSCULAR HEMOGLOBIN CONC: 34 g/dL (ref 32.0–36.0)
MEAN CORPUSCULAR HEMOGLOBIN: 41 pg — ABNORMAL HIGH (ref 25.9–32.4)
MEAN CORPUSCULAR VOLUME: 120.8 fL — ABNORMAL HIGH (ref 77.6–95.7)
MEAN PLATELET VOLUME: 7.9 fL (ref 6.8–10.7)
MONOCYTES ABSOLUTE COUNT: 0.3 10*9/L (ref 0.3–0.8)
MONOCYTES RELATIVE PERCENT: 8.3 %
NEUTROPHILS ABSOLUTE COUNT: 3.4 10*9/L (ref 1.8–7.8)
NEUTROPHILS RELATIVE PERCENT: 83.5 %
PLATELET COUNT: 726 10*9/L — ABNORMAL HIGH (ref 150–450)
RED BLOOD CELL COUNT: 2.57 10*12/L — ABNORMAL LOW (ref 3.95–5.13)
RED CELL DISTRIBUTION WIDTH: 16.5 % — ABNORMAL HIGH (ref 12.2–15.2)
WBC ADJUSTED: 4.1 10*9/L (ref 3.6–11.2)

## 2023-12-16 LAB — COMPREHENSIVE METABOLIC PANEL
ALBUMIN: 3.7 g/dL (ref 3.4–5.0)
ALKALINE PHOSPHATASE: 54 U/L (ref 46–116)
ALT (SGPT): <7 U/L — ABNORMAL LOW (ref 10–49)
ANION GAP: 13 mmol/L (ref 5–14)
AST (SGOT): 14 U/L (ref <=34)
BILIRUBIN TOTAL: 0.4 mg/dL (ref 0.3–1.2)
BLOOD UREA NITROGEN: 56 mg/dL — ABNORMAL HIGH (ref 9–23)
BUN / CREAT RATIO: 34
CALCIUM: 9.4 mg/dL (ref 8.7–10.4)
CHLORIDE: 104 mmol/L (ref 98–107)
CO2: 24.5 mmol/L (ref 20.0–31.0)
CREATININE: 1.67 mg/dL — ABNORMAL HIGH (ref 0.55–1.02)
EGFR CKD-EPI (2021) FEMALE: 32 mL/min/1.73m2 — ABNORMAL LOW (ref >=60)
GLUCOSE RANDOM: 265 mg/dL — ABNORMAL HIGH (ref 70–179)
POTASSIUM: 4.4 mmol/L (ref 3.4–4.8)
PROTEIN TOTAL: 6.7 g/dL (ref 5.7–8.2)
SODIUM: 141 mmol/L (ref 135–145)

## 2023-12-16 LAB — LIPID PANEL
CHOLESTEROL: 149 mg/dL (ref <200)
HDL CHOLESTEROL: 35 mg/dL — ABNORMAL LOW (ref >50)
LDL CHOLESTEROL CALCULATED: 88 mg/dL (ref <100)
NON-HDL CHOLESTEROL: 114 mg/dL (ref <130)
TRIGLYCERIDES: 169 mg/dL — ABNORMAL HIGH (ref <150)

## 2023-12-16 LAB — HEMOGLOBIN A1C
ESTIMATED AVERAGE GLUCOSE: 177 mg/dL
HEMOGLOBIN A1C: 7.8 % — ABNORMAL HIGH (ref 4.8–5.6)

## 2023-12-16 NOTE — Unmapped (Signed)
 Great meeting you today     Let's check bloodwork today     I will be in touch with all the results    COVID booster today     Tdap sometime at the pharmacy    Best,    Brydon Spahr G Paulena Servais, MD

## 2023-12-20 LAB — VITAMIN D 25 HYDROXY: VITAMIN D, TOTAL (25OH): 48.7 ng/mL (ref 20.0–80.0)

## 2023-12-22 NOTE — Unmapped (Signed)
 Received call requesting prescription for CPAP supply ?  Caller with accent asked repeatedly what was needed  caller kept asking for a script but also stated she had a script  requested to speak with another co worker as Clinical research associate could not understand need       Caller also gave different contact info then what was on the phone [phone] [employer]     While on hold went into the chart order found for CPAP renewl supplies in January [facility] care listed as       Spoke with [facility] supervisor who needs new prescription for CPAP supplies  Per [facility] some information is missing / [facility] will send request to the fax number

## 2023-12-27 DIAGNOSIS — Z940 Kidney transplant status: Principal | ICD-10-CM

## 2023-12-27 DIAGNOSIS — Z79899 Other long term (current) drug therapy: Principal | ICD-10-CM

## 2023-12-29 ENCOUNTER — Ambulatory Visit: Admit: 2023-12-29 | Discharge: 2023-12-29 | Payer: MEDICARE | Attending: Nephrology | Primary: Nephrology

## 2023-12-29 ENCOUNTER — Ambulatory Visit: Admit: 2023-12-29 | Discharge: 2023-12-29 | Payer: MEDICARE

## 2023-12-29 ENCOUNTER — Inpatient Hospital Stay: Admit: 2023-12-29 | Discharge: 2023-12-29 | Payer: MEDICARE

## 2023-12-29 DIAGNOSIS — D473 Essential (hemorrhagic) thrombocythemia: Principal | ICD-10-CM

## 2023-12-29 DIAGNOSIS — Z940 Kidney transplant status: Principal | ICD-10-CM

## 2023-12-29 DIAGNOSIS — I10 Essential (primary) hypertension: Principal | ICD-10-CM

## 2023-12-29 DIAGNOSIS — D849 Immunodeficiency, unspecified: Principal | ICD-10-CM

## 2023-12-29 DIAGNOSIS — Z8739 Personal history of other diseases of the musculoskeletal system and connective tissue: Principal | ICD-10-CM

## 2023-12-29 DIAGNOSIS — Z79899 Other long term (current) drug therapy: Principal | ICD-10-CM

## 2023-12-29 LAB — URINALYSIS WITH MICROSCOPY
BACTERIA: NONE SEEN /HPF
BILIRUBIN UA: NEGATIVE
BLOOD UA: NEGATIVE
GLUCOSE UA: 500 — AB
HYALINE CASTS: 1 /LPF (ref 0–1)
KETONES UA: NEGATIVE
LEUKOCYTE ESTERASE UA: NEGATIVE
NITRITE UA: NEGATIVE
PH UA: 5 (ref 5.0–9.0)
PROTEIN UA: NEGATIVE
RBC UA: <1 /HPF (ref <=4)
SPECIFIC GRAVITY UA: 1.005 (ref 1.003–1.030)
SQUAMOUS EPITHELIAL: <1 /HPF (ref 0–5)
UROBILINOGEN UA: <2
WBC UA: <1 /HPF (ref 0–5)

## 2023-12-29 LAB — CBC W/ AUTO DIFF
BASOPHILS ABSOLUTE COUNT: 0 10*9/L (ref 0.0–0.1)
BASOPHILS RELATIVE PERCENT: 0.4 %
EOSINOPHILS ABSOLUTE COUNT: 0 10*9/L (ref 0.0–0.5)
EOSINOPHILS RELATIVE PERCENT: 0.2 %
HEMATOCRIT: 28.4 % — ABNORMAL LOW (ref 34.0–44.0)
HEMOGLOBIN: 9.8 g/dL — ABNORMAL LOW (ref 11.3–14.9)
LYMPHOCYTES ABSOLUTE COUNT: 0.3 10*9/L — ABNORMAL LOW (ref 1.1–3.6)
LYMPHOCYTES RELATIVE PERCENT: 4.2 %
MEAN CORPUSCULAR HEMOGLOBIN CONC: 34.6 g/dL (ref 32.0–36.0)
MEAN CORPUSCULAR HEMOGLOBIN: 41.9 pg — ABNORMAL HIGH (ref 25.9–32.4)
MEAN CORPUSCULAR VOLUME: 121.2 fL — ABNORMAL HIGH (ref 77.6–95.7)
MEAN PLATELET VOLUME: 7.5 fL (ref 6.8–10.7)
MONOCYTES ABSOLUTE COUNT: 0.4 10*9/L (ref 0.3–0.8)
MONOCYTES RELATIVE PERCENT: 7.1 %
NEUTROPHILS ABSOLUTE COUNT: 5.4 10*9/L (ref 1.8–7.8)
NEUTROPHILS RELATIVE PERCENT: 88.1 %
PLATELET COUNT: 726 10*9/L — ABNORMAL HIGH (ref 150–450)
RED BLOOD CELL COUNT: 2.34 10*12/L — ABNORMAL LOW (ref 3.95–5.13)
RED CELL DISTRIBUTION WIDTH: 16.2 % — ABNORMAL HIGH (ref 12.2–15.2)
WBC ADJUSTED: 6.1 10*9/L (ref 3.6–11.2)

## 2023-12-29 LAB — RENAL FUNCTION PANEL
ALBUMIN: 3.7 g/dL (ref 3.4–5.0)
ANION GAP: 11 mmol/L (ref 5–14)
BLOOD UREA NITROGEN: 48 mg/dL — ABNORMAL HIGH (ref 9–23)
BUN / CREAT RATIO: 32
CALCIUM: 9.3 mg/dL (ref 8.7–10.4)
CHLORIDE: 105 mmol/L (ref 98–107)
CO2: 23.5 mmol/L (ref 20.0–31.0)
CREATININE: 1.49 mg/dL — ABNORMAL HIGH (ref 0.55–1.02)
EGFR CKD-EPI (2021) FEMALE: 37 mL/min/1.73m2 — ABNORMAL LOW (ref >=60)
GLUCOSE RANDOM: 226 mg/dL — ABNORMAL HIGH (ref 70–99)
PHOSPHORUS: 3.8 mg/dL (ref 2.4–5.1)
POTASSIUM: 3.8 mmol/L (ref 3.4–4.8)
SODIUM: 139 mmol/L (ref 135–145)

## 2023-12-29 LAB — MAGNESIUM: MAGNESIUM: 1.9 mg/dL (ref 1.6–2.6)

## 2023-12-29 LAB — ALBUMIN / CREATININE URINE RATIO
ALBUMIN QUANT URINE: 0.5 mg/dL
ALBUMIN/CREATININE RATIO: 32.9 ug/mg — ABNORMAL HIGH (ref 0.0–30.0)
CREATININE, URINE: 15.2 mg/dL

## 2023-12-29 LAB — PROTEIN / CREATININE RATIO, URINE
CREATININE, URINE: 15.2 mg/dL
PROTEIN URINE: 7.3 mg/dL
PROTEIN/CREAT RATIO, URINE: 0.48

## 2023-12-29 MED ORDER — CEPHALEXIN 250 MG CAPSULE
ORAL_CAPSULE | Freq: Every day | ORAL | 3 refills | 90.00000 days | Status: CP
Start: 2023-12-29 — End: ?

## 2023-12-29 MED ORDER — METFORMIN 500 MG TABLET
ORAL_TABLET | Freq: Two times a day (BID) | ORAL | 3 refills | 90.00000 days | Status: CP
Start: 2023-12-29 — End: ?

## 2023-12-29 MED ORDER — PANTOPRAZOLE 40 MG TABLET,DELAYED RELEASE
ORAL_TABLET | Freq: Every day | ORAL | 3 refills | 90.00000 days | Status: CP
Start: 2023-12-29 — End: ?

## 2023-12-29 MED ORDER — MYFORTIC 180 MG TABLET,DELAYED RELEASE
ORAL_TABLET | Freq: Two times a day (BID) | ORAL | 11 refills | 30.00000 days | Status: CP
Start: 2023-12-29 — End: 2024-12-28

## 2023-12-29 MED ORDER — PRAVASTATIN 40 MG TABLET
ORAL_TABLET | Freq: Every day | ORAL | 3 refills | 90.00000 days | Status: CP
Start: 2023-12-29 — End: ?

## 2023-12-29 MED ORDER — COLCHICINE 0.6 MG TABLET
ORAL_TABLET | Freq: Two times a day (BID) | ORAL | 3 refills | 45.00000 days | Status: CP | PRN
Start: 2023-12-29 — End: ?

## 2023-12-29 MED ORDER — FUROSEMIDE 40 MG TABLET
ORAL_TABLET | Freq: Every day | ORAL | 4 refills | 90.00000 days | Status: CP
Start: 2023-12-29 — End: 2024-12-28

## 2023-12-29 MED ORDER — EMPAGLIFLOZIN 25 MG TABLET
ORAL_TABLET | Freq: Every day | ORAL | 3 refills | 90.00000 days | Status: CP
Start: 2023-12-29 — End: ?

## 2023-12-29 MED ORDER — LOSARTAN 50 MG TABLET
ORAL_TABLET | Freq: Every day | ORAL | 3 refills | 90.00000 days | Status: CP
Start: 2023-12-29 — End: ?

## 2023-12-29 MED ORDER — ALLOPURINOL 100 MG TABLET
ORAL_TABLET | Freq: Every day | ORAL | 3 refills | 90.00000 days | Status: CP
Start: 2023-12-29 — End: ?

## 2023-12-29 MED ORDER — EPLERENONE 25 MG TABLET
ORAL_TABLET | Freq: Every day | ORAL | 3 refills | 100.00000 days | Status: CP
Start: 2023-12-29 — End: ?

## 2023-12-29 MED ORDER — OMEGA 3-DHA-EPA-FISH OIL 300 MG-1,000 MG CAPSULE
ORAL_CAPSULE | Freq: Every day | ORAL | 3 refills | 90.00000 days | Status: CP
Start: 2023-12-29 — End: ?

## 2023-12-29 MED ORDER — CHOLECALCIFEROL (VITAMIN D3) 25 MCG (1,000 UNIT) TABLET
ORAL_TABLET | Freq: Every day | ORAL | 3 refills | 90.00000 days | Status: CP
Start: 2023-12-29 — End: ?

## 2023-12-29 MED ORDER — SODIUM BICARBONATE 650 MG TABLET
ORAL_TABLET | Freq: Two times a day (BID) | ORAL | 3 refills | 90.00000 days | Status: CP
Start: 2023-12-29 — End: ?

## 2023-12-29 MED ORDER — CARVEDILOL 25 MG TABLET
ORAL_TABLET | Freq: Two times a day (BID) | ORAL | 3 refills | 90.00000 days | Status: CP
Start: 2023-12-29 — End: ?

## 2023-12-29 MED ORDER — PROGRAF 0.5 MG CAPSULE
ORAL_CAPSULE | Freq: Every day | ORAL | 11 refills | 45.00000 days | Status: CP
Start: 2023-12-29 — End: ?

## 2023-12-29 MED ORDER — EMPAGLIFLOZIN 10 MG TABLET
ORAL_TABLET | Freq: Every day | ORAL | 0 refills | 90.00000 days | Status: CP
Start: 2023-12-29 — End: 2023-12-29

## 2023-12-29 NOTE — Unmapped (Signed)
 I spent 15 minutes with the patient at her clinic visit. I reviewed and updated her medications. She took her tacrolimus at 10am this morning. The last tacrolimus level was a good trough. Refills sent on prescriptions needed.    Fever: No  Chills: No    Pain: yes: no    Painful urination:No    Nausea: No  Vomiting: No  Diarrhea: No    Chest Pain: No    Tremors: No  Headaches: No  Numbness/Tingling: No    Edema: No--on lasix  SOB: No  Intake:   Output:    BP readings: 125-135/60's  Dizziness/Lightheadedness:No    Other issues/concerns: has been holding metformin, will restart

## 2023-12-29 NOTE — Unmapped (Addendum)
 Transplant Nephrology Clinic Visit    Assessment and Plan    Erin Welch is a 73 y.o. 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 She is seen for follow up of her transplant and associated medical problems.     # Status post kidney transplant   - Serum creatinine is 1.49 mg/dL and has returned to baseline after recent episodes of AKI   - UPC is 0.480, UACR 32.9  - DSA screens historically negative (Most recent 08/26/23)  - Renal US  12/29/23 without obstruction  - Will continue Jardiance  for CKD and heart disease    # Immunosuppression management.   - Tacrolimus  level today is not a trough level. Last trough was 4.5 on 11/14/23 (trough target 4-7).   - Will continue tacrolimus  1 mg AM/0.5 mg PM and Myfortic  360 mg bid     # Essential thrombocythemia, CALR mutated.   - Platelet count 726,000   - Had recent transition from hydroxyurea  to pacritinib  them back to hydroxyurea    - Bone marrow biopsy 05/09/23 with hypercellular marrow with features of ET with 3% blasts. Has known CALR mutation   - Dr. Jerrye and [place] Adjutant, AGNP will follow    # History of recurrent UTI.    - She currently has no symptoms of UTI    - UA not suggestive of UTI    # Hypertension management  # Adrenal Adenoma  - BP today is 137/50  - Right adrenal adenoma 3.0 cm in size noted on chest CT 07/16/22 (was 2.7 cm 12/27/2014).  - Will continue eplerenone , losartan , furosemide , amlodipine , carvedilol     # Hypokalemia, metabolic acidosis, hypomagnesemia  - K 3.8, CO2 23.5  - Mg normalized on Jardiance   - Continue NaHCO3    Macrocytic anemia.  - Hemoglobin 9.8   - Iron stores have been adequate, B12 normal  - SPEP with immunofixation revealed a low level IgA kappa monoclonal antibody. If proteinuria worsens may need to investigate for possible MGRS.   - Continue aranesp      Right middle lobe lung nodule, possible stage 1 lung CA  - PET CT scan 08/01/20 with mildly hypermetabolic right middle lobe nodule. This was done for follow up on a pulmonary nodule noted on chest CT in 01/2020. Repeat chest CT 01/20/21 revealed stable 1.3 cm RML nodule. CT 09/18/21 after radiation revealed reduction in size to 0.8 cm.   - Radiation therapy with 1800cGy x 3 fractions completed 06/19/21.   - Follow up chest CT most recently 07/22/23 revealed stable nodule in RML without evidence of recurrence     History of COPD and pulmonary HTN with sleep disordered breathing.   - Echocardiogram with severe pulmonary HTN on 08/19/23   - Right heart cath 11/09/23 with pulmonary HTN with a mean PA pressure of 36 mm Hg, PCWP of 8 mm Hg, minimal SVC/RA stenosis.  - She will continue to follow up with Dr. Primus and Dr. Burlene  - On CPAP, Flonase , inhalers, home O2     History of HFpEF with LV diastolic dysfunction.   - On furosemide , losartan , eplerenone , carvedilol , amlodipine  prior to today  - Follow up with Dr. Burlene.    DM, type 2  - On Jardiance    - Metformin  recently held due to AKI.   - Managed by Dr. Salli, [facility]    Gout.  - Allopurinol  100 mg daily to be continued  Bilateral knee osteoarthritis.   - Surgery has been deferred in light of recent illnesses.     Immunizations.   -  Influenza vaccine 04/14/23   - Shingrix x 2 completed (04/15/17, 10/11/17).   - COVID-19 12/16/23  - Pneumovax 23 07/09/20.  Prevnar 13 04/16/14  - RSV vaccine 02/25/22    Cancer screening.    - She sees a dermatologist regularly for history of NMSC.   - RUS 12/29/23 no renal masses     Asymptomatic cholelithiasis noted on multiple prior ultrasound studies.  - A large (3.6 cm) gallstone confirmed on 05/11/20 US   - No further therapy is presently warranted per surgery despite the large stone noted (3.6 cm).   - Denies symptoms related to cholelithiasis    Plan Summary and Follow-up.   - Continue current immunosuppression  - Transplant clinic 3 months      History of Present Illness  Erin Welch is 73 y.o.and received a deceased donor kidney transplant on 04/04/2013. She has no history of rejection, proteinuria or DSAs.      She was hospitalized 12/15/20-12/17/20 for parainfluenza associated pneumonia, AKI, and gout flare. Follow up chest CT on 01/20/21 revealed a stable 1.3 cm lung nodule, but also revealed new pulmonary infiltrates felt likely to be residual inflammation related to the parinfluenza infection. She was seen in pulmonary clinic 04/21/21 with concern that the lung nodule was slowly increasing in size and FDG avid. She was seen in radiation oncology clinic 05/21/21 with plans for empiric SBRT. She was not felt to be a candidate for lung biopsy. She started radiation therapy on 06/15/21 and completed 5400 cGy of therapy by 06/19/21. Follow up CT 09/18/21 revealed the lung nodule had diminished in size to 8 mm. Active surveillance is now ongoing without further therapy with CT scans 01/08/22, 04/09/22, 07/16/22 and 01/28/23  showing evolving post-radiation fibrosis/pneumonitis changes.     She was hospitalized 02/28/23-03/03/23 after presenting with diarrhea of 2 weeks duration. GIPP and C.diff were negative. Admission labs included K 2.3, serum CO2 13, Na 133, creatinine 3.0, Mg 1.1, hemoglobin 9.1. She was treated with IV fluids, Mg and K replacement and sodium bicarbonate . Metformin , lasix , eplerenone , and losartan  were all held. Sodium bicarbonate  was increased to 650 mg tid. Myfortic  was reduced to 180 mg bid on discharge and tacrolimus  to 0.5 mg daily. She was also transfused 1 unit pRBCs on 03/01/23 and 1 unit on 03/08/23.     She was seen by Dr. Primus 05/20/23 in pulmonary HTN clinic for pulmonary HTN related to diastolic dysfunction, COPD, and sleep apnea. Echocardiogram revealed no pulmonary HTN in August so no right heart cath was pursued.    She saw Dr. Jerrye on 05/16/23 after a bone marrow biopsy 05/09/23 revealed no progression to MF with CALR-mutated ET in chronic phase without MDS. Aspirin  was continued and hydroxyurea  reduced to ten 500 mg tabs per week. Due to anemia ESA therapy is being considered. A transition to the JAKi pacrtinib was made within the past 2 weeks.     Interval History  She was hospitalized 08/26/23-08/28/23 after presenting with cough, flank and back pain, and pleuritic chest pain in the setting of worsing thrombocytosis (platelets >1.5 million). LE dopplers were negative for DVT. V/Q scan done after discharge on 09/05/23 revealed no evidence of pulmonary embolism. She did have a pleural effusion. ProBNP was 4261. Serum creatinine was up to 2.24 mg/dL on admission and 8.04 mg/dL on discharge. Jardiance  and metformin  were stopped on  discharge. Pacritinib  was stopped and hydroxyurea  was restarted.     She was seen by Dr. Primus on 11/04/23 in pulmonary clinic for pulmonary HTN secondary to LV diastolic dysfunction with COPD/sleep disordered breathing. A RHC was done on 11/09/23 revealing pulmonary HTN with a mean PA pressure of 36 mm Hg, PCWP of 8 mm Hg, minimal SVC/RA stenosis. She did not tolerate sildenafil  and it has been stopped.    She was seen in Geriatrics clinic by Dr. Estell on 12/16/23. No changes in her medications were made at that visit.    Today she notes continued DOE especially if she is off oxygen . She denies chest pain and edema is stable on furosemide  80 mg daily. Home BP has been 120-135/60's. She denies nausea, vomiting, diarrhea or abdominal pain. She is administering aranesp  at home.     Immunosuppression: tacrolimus  1 mg AM/0.5 mg PM, Myfortic  360 mg bid.    Last dose of tacrolimus : 10 PM    Transplant History:  1. ESRD of unknown etiology.   2. Deceased donor kidney transplant 04/03/2013. CMV High Risk: D+/R-. EBV: D+/R+. Implantation biopsy with mild to moderate arteriolar hyalinosis.  3. Thymooglobulin induction  4. Delayed graft function  5. Maintenance immunosuppression tacrolimus , mycophenolate , prednisone .  6. 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 Recent screens have been negative as of 09/21/16.  7. Baseline creatinine 1.3-1.6 mg/dL.    Previous Medical History:  1. Native kidney disease unknown etiology  2. Pulmonary hypertension, controlled.  3. Heart failure with preserved left ventricular function., controlled.  4. Post-traumatic osteoarthritis of bilateral knee.  5. Anemia of chronic kidney failure, hydroxyurea  dosing.  6. Essential thrombocytosis.  7. History of gout.  8. History of COPD, well-compensated.  9. OSA  10. Small intestine AVMs  11. Covid Infection (BAM on 07/12/19)  12. Lung nodule, probable lung CA, s/p RT 06/2021  13. Adrenal adenoma    Review of Systems    All other systems are reviewed and are negative. A 10 systems review was completed.    Medications  Current Outpatient Medications   Medication Sig Dispense Refill    acetaminophen  (TYLENOL  8 HOUR) 650 MG CR tablet Take 1 tablet (650 mg total) by mouth nightly.      albuterol  HFA 90 mcg/actuation inhaler Inhale 2 puffs every six (6) hours as needed for wheezing or shortness of breath. 1 Inhaler 0    allopurinol  (ZYLOPRIM ) 100 MG tablet Take 2 tablets (200 mg total) by mouth daily. 180 tablet 3    amlodipine  (NORVASC ) 5 MG tablet Take 1 tablet by mouth once daily 90 tablet 3    aspirin  (ECOTRIN) 81 MG tablet Take 1 tablet (81 mg total) by mouth daily.      carvedilol  (COREG ) 25 MG tablet Take 1 tablet (25 mg total) by mouth two (2) times a day. 180 tablet 3    cephalexin  (KEFLEX ) 250 MG capsule Take 1 capsule (250 mg total) by mouth daily. 90 capsule 3    cholecalciferol , vitamin D3 25 mcg, 1,000 units,, 1,000 unit (25 mcg) tablet Take 1 tablet (25 mcg total) by mouth daily. 90 tablet 3    colchicine  (COLCRYS ) 0.6 mg tablet Take 1 tablet (0.6 mg total) by mouth two (2) times a day as needed. For acute gout, take 2 tablets by mouth, then 1 tablet one hour later on first day. May continue 1 tablet up to twice a day as needed. 90 tablet 3    darbepoetin alfa -polysorbate (ARANESP ) 300 mcg/0.6 mL Syrg Inject 0.6  mL (300 mcg total) under the skin every twenty-eight (28) days. 0.6 mL 11    empagliflozin  (JARDIANCE ) 10 mg tablet Take 1 tablet (10 mg total) by mouth daily. 90 tablet 0    eplerenone  (INSPRA ) 25 MG tablet Take 1 tablet (25 mg total) by mouth daily. 100 tablet 3    fish oil -omega-3 fatty acids 300-1,000 mg capsule Take 1 capsule (1 g total) by mouth daily. 90 capsule 3    fluticasone  propionate (FLONASE ) 50 mcg/actuation nasal spray Use 1 spray(s) in each nostril twice daily (Patient taking differently: 1 spray into each nostril daily as needed.) 16 g 11    furosemide  (LASIX ) 40 MG tablet Take 2 tablets (80 mg total) by mouth in the morning. 180 tablet 4    glucosamine-chondroitin 500-400 mg tablet Take 1 tablet by mouth Two (2) times a day.      hydroxyurea  (HYDREA ) 500 mg capsule Take 3 capsules (1500 mg total) every other day, with 2 capsules (1000 mg total) on the other days. 270 capsule 1    INCRUSE ELLIPTA  62.5 mcg/actuation inhaler Inhale 1 puff by mouth once daily 90 each 0    losartan  (COZAAR ) 50 MG tablet Take 1 tablet (50 mg total) by mouth daily. 90 tablet 3    MEDICAL SUPPLY ITEM 14 cm into each nostril nightly. CPAP Auto titrating machine 14cmH2O 3PR  [employer] is patient's DME provider.      melatonin 10 mg Tab Take 1 tablet (10 mg total) by mouth nightly.      metFORMIN  (GLUCOPHAGE ) 500 MG tablet Take 1 tablet (500 mg total) by mouth in the morning and 1 tablet (500 mg total) in the evening. Take with meals. 180 tablet 3    MYFORTIC  180 mg EC tablet Take 2 tablets (360 mg total) by mouth two (2) times a day. 120 tablet 11    pantoprazole  (PROTONIX ) 40 MG tablet Take 1 tablet (40 mg total) by mouth daily. 90 tablet 3    pravastatin  (PRAVACHOL ) 40 MG tablet Take 1 tablet (40 mg total) by mouth daily. 90 tablet 3    PROGRAF  0.5 mg capsule Take 2 capsules (1 mg total) by mouth in the morning. Take 1 mg in morning and 0.5 mg in evening. 90 capsule 11    sodium bicarbonate  650 mg tablet Take 1 tablet (650 mg total) by mouth two (2) times a day. NEW DOSE 180 tablet 3    zolpidem  (AMBIEN ) 5 MG tablet Take 1 tablet (5 mg total) by mouth nightly as needed. 30 tablet 3     No current facility-administered medications for this visit.       Physical Exam  BP 137/50 (BP Site: L Arm, BP Position: Sitting, BP Cuff Size: Large)  - Pulse 68  - Temp 36.1 ??C (97 ??F) (Temporal)  - Wt 87 kg (191 lb 12.8 oz)  - BMI 36.26 kg/m??   General: Patient is a pleasant female in no apparent distress.  Eyes: Sclera anicteric.  Neck: Supple without LAD/JVD/bruits.  Lungs: Clear to auscultation bilaterally, no wheezes/rales/rhonchi.  Cardiovascular:  A grade 2/6 midsystolic murmur is audible. .  Abdomen: Soft, notender/nondistended. Positive bowel sounds. No hepatosplenomegaly, masses or bruits appreciated.  Extremities: Without edema, joints without evidence of synovitis  Skin: Without rash  Neurological:  Patient is alert and oriented.  There are no motor or sensory deficits. .  Psychiatric: Mood and affect appropriate.      Laboratory Results and Imaging Reviewed in EMR

## 2023-12-30 LAB — CMV DNA, QUANTITATIVE, PCR: CMV VIRAL LD: NOT DETECTED

## 2023-12-30 LAB — TACROLIMUS LEVEL, TROUGH: TACROLIMUS, TROUGH: 11.7 ng/mL (ref 5.0–15.0)

## 2023-12-30 LAB — BK VIRUS QUANTITATIVE PCR, BLOOD: BK BLOOD RESULT: NOT DETECTED

## 2024-01-03 MED ORDER — FUROSEMIDE 40 MG TABLET
ORAL_TABLET | Freq: Every day | ORAL | 4 refills | 180.00000 days
Start: 2024-01-03 — End: 2025-01-02

## 2024-01-03 NOTE — Unmapped (Signed)
 Pt reports in MyChart she is only taking 40mg  Lasix  daily instead of 80mg  daily. Updated med list to reflect this correction.

## 2024-01-06 NOTE — Unmapped (Signed)
 The [facility] has made a second and final attempt to reach this patient to refill the following medication:ARANESP  (IN POLYSORBATE) 300 mcg/0.6 mL Syrg (darbepoetin alfa -polysorbate).      We have left voicemails on the following phone numbers: [phone] and [phone], have sent a MyChart message, have sent a text message to the following phone numbers: [phone] and [phone], and have sent a Mychart questionnaire..    Dates contacted: 6/20 -6/27  Last scheduled delivery: 12/07/23 (28d/s)    The patient may be at risk of non-compliance with this medication. The patient should call the [facility] at [phone]  Option 4, then Option 1: Oncology to refill medication.    Art therapist

## 2024-01-07 NOTE — Unmapped (Signed)
 [facility] Refill Coordination Note    Erin Welch, Erin Welch  Phone: There are no phone numbers on file.      All above HIPAA information was verified with patient.         01/06/2024     7:24 PM   Specialty Rx Medication Refill Questionnaire   Which Medications would you like refilled and shipped? ARANESP    Please list all current allergies: MACROBID   Have you missed any doses in the last 30 days? No   Have you had any changes to your medication(s) since your last refill? No   How many days remaining of each medication do you have at home? 30   If receiving an injectable medication, next injection date is 01/12/2024   Have you experienced any side effects in the last 30 days? No   Please enter the full address (street address, city, state, zip code) where you would like your medication(s) to be delivered to. [address] [place], [place] [address]   Please specify on which day you would like your medication(s) to arrive. Note: if you need your medication(s) within 3 days, please call the pharmacy to schedule your order at [phone]  01/10/2024   Has your insurance changed since your last refill? No   Would you like a pharmacist to call you to discuss your medication(s)? No   Do you require a signature for your package? (Note: if we are billing Medicare Part B or your order contains a controlled substance, we will require a signature) No   I have been provided my out of pocket cost for my medication and approve the pharmacy to charge the amount to my credit card on file. Yes         Completed refill call assessment today to schedule patient's medication shipment from the [facility] [phone]).  All relevant notes have been reviewed.       Confirmed patient received a Conservation officer, historic buildings and a Surveyor, mining with first shipment. The patient will receive a drug information handout for each medication shipped and additional FDA Medication Guides as required.         REFERRAL TO PHARMACIST     Referral to the pharmacist: Not needed      [facility]     Shipping address confirmed in Epic.     Delivery Scheduled: Yes, Expected medication delivery date: 01/10/24.     Medication will be delivered via Same Day Courier to the prescription address in Epic WAM.    Erin Welch   [facility] Specialty Technician

## 2024-01-10 DIAGNOSIS — N183 Anemia of chronic renal failure, stage 3 (moderate), unspecified whether stage 3a or 3b CKD (CMS-HCC): Principal | ICD-10-CM

## 2024-01-10 DIAGNOSIS — D631 Anemia in chronic kidney disease: Principal | ICD-10-CM

## 2024-01-10 MED FILL — ARANESP 300 MCG/0.6 ML (IN POLYSORBATE) INJECTION SYRINGE: SUBCUTANEOUS | 28 days supply | Qty: 0.6 | Fill #3

## 2024-01-20 NOTE — Unmapped (Signed)
 Radiation Oncology Follow Up Visit Note    Patient Name: Erin Welch  Patient Age: 73 y.o.  Encounter Date: 01/24/2024    Referring Physician:   Referring, Info Unavailable  No address on file    Primary Care Provider:  Christell Ozell Eans, MD    Diagnoses:  1. Lung nodule        Treatment Site: RML, 1800cGy x 3 fractions = 5400cGy, completed 06/19/21     Interval Since Completion of Treatment: 2 years and 7 months      Assessment: 73 yo  former smoker (quit >15 years ago) with history of renal transplant (03/2013) maintained on tacrolimus , essential thrombocytopenia diagnosed 05/2003 and managed by Dr Daphne Cave, pulmonary hypertension with left heart disease managed by Dr Primus, COPD, DM2, and radiographic stage 1 lung cancer s/p XRT 06/19/2021.  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    CT Chest today was personally reviewed and reviewed and discussed with Dr Jacklin, demonstrating stable findings.      Plan:     -Disease Status: No evidence of progression on CT scan from today.        -Follow-up: 6 months with repeat CT chest      Interval History:  [name] returns today for a regularly scheduled follow-up, she was last seen in clinic 6 months ago.    States she is dependent on 3 L O2 [place]. Began using it August 2024 after she was hospitalized for hypokalemia. Unclear why she became O2 dependent after. Feels like she couldn't walk around a block. Baseline cough still present.     Going to have work done on skin cancer on [employer].    Review of Systems: All other systems reviewed are negative. Pertinent positives and negatives are above in interval history.    Past Medical, Surgical, Family and Social Histories reviewed and updated in the electronic medical record.    Hematology/Oncology History   Essential thrombocytosis      05/13/2003 Initial Diagnosis    Diagnosis = Essential thrombocythemia, 05/2003.  Dr. Marina.   During a well-woman evaluation, patient was noted to have incidental thrombocytosis, reportedly to 2.2 million.  She was started on hydroxyurea  + anagrelide and in ~2007 she developed anemia with low haptoglobin with concern for hemolysis though no clear source was found.  She was ultimately found to have multiple small bowel AVMs and required IV iron replacement and blood transfusions to manage.  She had genetic HHT evaluation due to the AVMs at [facility] which was negative in 2007.      Bone marrow biopsy = not done    Driver mutation = Not known.  JAK2 [id number]  Additional Myeloid Mutations = Not known.   Karyotype = Not known.     Thrombosis Risk =      05/13/2003 -  Chemotherapy    HYDROXYUREA  + ANAGRELIDE  - started due to platelets of 2.2 million.    - unclear when stopped anagrelide     07/12/2012 -  Chemotherapy    HYDROXYUREA   Dropped to single agent hydroxyurea   (+) transfusion-dependent anemia, received pRBCs on:  --> 05/06/20 (2)  --> 06/09/20 (2)  Started on darbepoietin alfa (Aranesp ) on 05/06/20    08/01/2020: WHOLE BODY PET-CT  - Mildly hypermetabolic right middle lobe nodule in the lateral segment as described above. Cannot exclude a low metabolic rate malignancy.      -  Kidney transplant is noted in the left pelvis without abnormality noted. No obstruction is detected.      -Mild to moderate cardiomegaly is noted with small to moderate size pericardial effusion. This was not noted on preceding exams.     08/26/20: 1000mg  daily alternating with 500mg  daily  WBC 4.3. ANC 3.6. Hgb 9.8. MCV 124. Plts 542.     09/29/2020: Seen by Daphne Cave, MD - [facility] (first visit)    Bmbx 10/08/2020:  Markedly hypercellular bone marrow (70%) with features compatible with reported history of Essential Thrombocytosis and 4% blasts by manual aspirate differential (see Comment)  -  Mild and focally moderate reticulin fibrosis (MF1-2).  - Driver Mutation = CALR, Type 1. VAF 36%   - Additional Myeloid Mutations = not detected. 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 anc 3.7. Hgb 10.0. MCV 124. Plts 681.     11/09/2021: Seen by B. Reeves in follow up.   She continues on hydroyxurea, now back to 1000mg  po daily. She completed radiation therapy for lung cancer. Although she had been transfusion-dependent of red blood cells not responsive to ESAs and there was consideration for danazol, since starting Jardiance  (SGL2 inhibitor) for diabetes, her hemoglobin has increased and she has not required a blood transfusion since 05/11/2021.   WBC 4.7 with normal differential save for lymphopenia (ALC 0.3), hgb 10.7 g/dL with MCV 874 (from hydroxyurea ), and platelets well-controlled at 552     04/13/2022: Seen by KATHEE Cave in follow up.   Continues on hydroxyurea  500mg  po BID (16 tablets/week)  Hgb 11.6 g/dL.  No transfusions.     09/02/2022: Hgb 12.0 g/dL  5/74/75: Hgb 88.9 g/dL  3/80/75: Hgb 8.9 g/dL  --> 3/78/75: 1U PRBC  02/23/23: Hgb 9.9 g/dL  --> 1/79/75: 1U PRBC  03/07/23: hgb 7.3 g/dL  --> 1/72/75: 2U PRBC  03/23/23: seen by JONELLE Adjutant in follow up.   --> continue HU 500mg  po BID (16 pills/wk)  03/28/23: Hgb 9.7 g/L.  Plts 1091.   --> increased HU to 17 pills/wk per Dr. Honor  05/09/23: Hgb 9.2 g/dL.  Plts 708. WBC 3.2.      05/09/2023 Biopsy    BONE MARROW BIOPSY  Indication = worsening anemia  @     -  Hypercellular bone marrow (60%) with features compatible with reported history of Essential Thrombocytosis and 3% blasts by manual aspirate differential (see Comment)  -  Mild reticulin fibrosis and no significant collagen fibrosis (MF-1)    Peripheral blood:   WBC 3.2, ANC 2.6, Hgb 9.2, MCV 1259, Plts 708.   No leukoerythroblastsosis    No LDH from 05/09/23, but from 03/08/23: 365 (ULN 246).      08/15/2023 - 08/27/2023 Chemotherapy    PACRITINIB     Indication = post-ET MF with substantial anemia on hydroxyurea  while simultaneously has elevated platelets to 1018.  Ruxolitinib not used given anemia.  Momelotinib considered, but with the post-marketing reports of 20% renal injury, elected for pacritinib  instead.     12-lead EKG ORDERED 07/26/23 as baseline before starting pacritinib . It has not yet been completed.     Stopped due to need for rapid cytoreduction while inpatient (plts 1546).  Resumed HU     08/27/2023 -  Chemotherapy    HYDROXYUREA  - alternating 1500mg  and 1000mg  (17/wk)  08/28/23: WBC 9.2, hgb 9.7, plts 1546  08/30/23: WBC 9.7, hgb 9.5, plts 1565  09/19/23: WBC 3.9, hgb 11.2, plts 669  Self  decreased to 16/wk  10/10/23: WBC 5.3, hgb 10.6, plts 849  Increased to 17/wk           Physical Exam:  Vital Signs for this encounter:  BP 151/68  - Pulse 62  - Temp 36.9 ??C (98.4 ??F) (Temporal)  - Resp 18  - Ht 154.9 cm (5' 1)  - Wt 87.2 kg (192 lb 4.8 oz)  - SpO2 97%  - BMI 36.33 kg/m??   Last weight:    Wt Readings from Last 4 Encounters:   01/24/24 87.2 kg (192 lb 4.8 oz)   12/29/23 87 kg (191 lb 12.8 oz)   12/16/23 87.3 kg (192 lb 6.4 oz)   11/09/23 86.2 kg (190 lb)     Karnofsky/Lansky Performance Status: 70, Cares for self; unable to carry on normal activity or to do active work (ECOG equivalent 1)  General:   No acute distress, alert and oriented X 4   HEENT:  Normocephalic and atraumatic.  Neck/lymphatics:  Supple.  Cardiovascular:  Extremities warm and well perfused.  Respiratory:  Breathing is non-labored.  Extremities:  No cyanosis or edema.  Neuro:   Alert and oriented x4.      Radiology  Results for orders placed during the hospital encounter of 01/24/24    CT Chest Wo Contrast    Impression  Stable post treatment changes in the right middle lobe with no findings of recurrent or new metastatic disease in the chest.    Signed by: Krystal Charlie Must, MD on 01/24/2024  3:32 PM     Karilyn Pop, PA-C  Department of Radiation Oncology  [facility]  [id number]  [place], [place] [address]  O: [phone]  01/24/24 3:42 PM

## 2024-01-20 NOTE — Unmapped (Signed)
 See comments

## 2024-01-24 ENCOUNTER — Inpatient Hospital Stay: Admit: 2024-01-24 | Discharge: 2024-01-24 | Payer: MEDICARE

## 2024-01-24 ENCOUNTER — Inpatient Hospital Stay
Admit: 2024-01-24 | Discharge: 2024-01-25 | Payer: MEDICARE | Attending: Radiation Oncology | Primary: Radiation Oncology

## 2024-01-24 NOTE — Unmapped (Signed)
 Pt here for a follow up with Dr. Jacklin today, pt reports no new c/o related to the visit today, no pain or discomfort related to the visit today, no N/V, no diarrhea or constipation, good appetite, no sleeping very well.

## 2024-01-27 NOTE — Unmapped (Signed)
 Patient Erin Welch was called in attempt number 1 to reach them regarding rescheduling their canceled appointments on 01/16/24.     The call resulted in: Message left    Please reschedule the patient upon their return call on next available for the following:     Lab Appointment and Provider or APP and CPP    Thank you,    [name]

## 2024-01-30 ENCOUNTER — Ambulatory Visit: Admit: 2024-01-30 | Payer: MEDICARE

## 2024-02-02 ENCOUNTER — Ambulatory Visit: Admit: 2024-02-02 | Discharge: 2024-02-03 | Payer: MEDICARE

## 2024-02-02 LAB — SLIDE REVIEW

## 2024-02-02 LAB — RENAL FUNCTION PANEL
ALBUMIN: 3.7 g/dL (ref 3.4–5.0)
ANION GAP: 10 mmol/L (ref 5–14)
BLOOD UREA NITROGEN: 46 mg/dL — ABNORMAL HIGH (ref 9–23)
BUN / CREAT RATIO: 28
CALCIUM: 10.1 mg/dL (ref 8.7–10.4)
CHLORIDE: 106 mmol/L (ref 98–107)
CO2: 26 mmol/L (ref 20.0–31.0)
CREATININE: 1.66 mg/dL — ABNORMAL HIGH (ref 0.55–1.02)
EGFR CKD-EPI (2021) FEMALE: 32 mL/min/1.73m2 — ABNORMAL LOW (ref >=60)
GLUCOSE RANDOM: 194 mg/dL — ABNORMAL HIGH (ref 70–99)
PHOSPHORUS: 3.3 mg/dL (ref 2.4–5.1)
POTASSIUM: 4 mmol/L (ref 3.4–4.8)
SODIUM: 142 mmol/L (ref 135–145)

## 2024-02-02 LAB — CBC W/ AUTO DIFF
BASOPHILS ABSOLUTE COUNT: 0 10*9/L (ref 0.0–0.1)
BASOPHILS RELATIVE PERCENT: 0.4 %
EOSINOPHILS ABSOLUTE COUNT: 0 10*9/L (ref 0.0–0.5)
EOSINOPHILS RELATIVE PERCENT: 0.4 %
HEMATOCRIT: 26.7 % — ABNORMAL LOW (ref 34.0–44.0)
HEMOGLOBIN: 9.2 g/dL — ABNORMAL LOW (ref 11.3–14.9)
LYMPHOCYTES ABSOLUTE COUNT: 0.3 10*9/L — ABNORMAL LOW (ref 1.1–3.6)
LYMPHOCYTES RELATIVE PERCENT: 13.4 %
MEAN CORPUSCULAR HEMOGLOBIN CONC: 34.6 g/dL (ref 32.0–36.0)
MEAN CORPUSCULAR HEMOGLOBIN: 43.2 pg — ABNORMAL HIGH (ref 25.9–32.4)
MEAN CORPUSCULAR VOLUME: 124.8 fL — ABNORMAL HIGH (ref 77.6–95.7)
MEAN PLATELET VOLUME: 8.3 fL (ref 6.8–10.7)
MONOCYTES ABSOLUTE COUNT: 0.2 10*9/L — ABNORMAL LOW (ref 0.3–0.8)
MONOCYTES RELATIVE PERCENT: 7.1 %
NEUTROPHILS ABSOLUTE COUNT: 1.9 10*9/L (ref 1.8–7.8)
NEUTROPHILS RELATIVE PERCENT: 78.7 %
PLATELET COUNT: 555 10*9/L — ABNORMAL HIGH (ref 150–450)
RED BLOOD CELL COUNT: 2.14 10*12/L — ABNORMAL LOW (ref 3.95–5.13)
RED CELL DISTRIBUTION WIDTH: 16.7 % — ABNORMAL HIGH (ref 12.2–15.2)
WBC ADJUSTED: 2.4 10*9/L — ABNORMAL LOW (ref 3.6–11.2)

## 2024-02-02 LAB — MAGNESIUM: MAGNESIUM: 1.8 mg/dL (ref 1.6–2.6)

## 2024-02-02 NOTE — Unmapped (Signed)
 Called the patient to reschedule her appointment. Left message.

## 2024-02-03 LAB — TACROLIMUS LEVEL, TROUGH: TACROLIMUS, TROUGH: 4.4 ng/mL — ABNORMAL LOW (ref 5.0–15.0)

## 2024-02-10 NOTE — Unmapped (Signed)
 The [facility] has made a second and final attempt to reach this patient to refill the following medication:ARANESP  (IN POLYSORBATE) 300 mcg/0.6 mL Syrg (darbepoetin alfa -polysorbate).      We have left voicemails on the following phone numbers: [phone] and [phone], have sent a MyChart message, have sent a text message to the following phone numbers: [phone], and have sent a Mychart questionnaire..    Dates contacted: 7/24 and 8/1  Last scheduled delivery: 01/10/24 (28d/s)    The patient may be at risk of non-compliance with this medication. The patient should call the [facility] at [phone]  Option 4, then Option 1: Oncology to refill medication.    Art therapist

## 2024-02-16 DIAGNOSIS — Z79899 Other long term (current) drug therapy: Principal | ICD-10-CM

## 2024-02-16 DIAGNOSIS — Z940 Kidney transplant status: Principal | ICD-10-CM

## 2024-02-17 ENCOUNTER — Ambulatory Visit: Admit: 2024-02-17 | Discharge: 2024-02-18 | Payer: MEDICARE

## 2024-02-17 DIAGNOSIS — I151 Hypertension secondary to other renal disorders: Principal | ICD-10-CM

## 2024-02-17 DIAGNOSIS — Z79899 Other long term (current) drug therapy: Principal | ICD-10-CM

## 2024-02-17 DIAGNOSIS — Z940 Kidney transplant status: Principal | ICD-10-CM

## 2024-02-17 DIAGNOSIS — I5030 Unspecified diastolic (congestive) heart failure: Principal | ICD-10-CM

## 2024-02-17 DIAGNOSIS — I272 Pulmonary hypertension, unspecified: Principal | ICD-10-CM

## 2024-02-17 LAB — RENAL FUNCTION PANEL
ALBUMIN: 3.5 g/dL (ref 3.4–5.0)
ANION GAP: 10 mmol/L (ref 5–14)
BLOOD UREA NITROGEN: 40 mg/dL — ABNORMAL HIGH (ref 9–23)
BUN / CREAT RATIO: 26
CALCIUM: 9.6 mg/dL (ref 8.7–10.4)
CHLORIDE: 106 mmol/L (ref 98–107)
CO2: 25.5 mmol/L (ref 20.0–31.0)
CREATININE: 1.51 mg/dL — ABNORMAL HIGH (ref 0.55–1.02)
EGFR CKD-EPI (2021) FEMALE: 36 mL/min/1.73m2 — ABNORMAL LOW (ref >=60)
GLUCOSE RANDOM: 262 mg/dL — ABNORMAL HIGH (ref 70–179)
PHOSPHORUS: 2.9 mg/dL (ref 2.4–5.1)
POTASSIUM: 3.7 mmol/L (ref 3.4–4.8)
SODIUM: 141 mmol/L (ref 135–145)

## 2024-02-17 LAB — CBC W/ AUTO DIFF
BASOPHILS ABSOLUTE COUNT: 0 10*9/L (ref 0.0–0.1)
BASOPHILS RELATIVE PERCENT: 0.5 %
EOSINOPHILS ABSOLUTE COUNT: 0 10*9/L (ref 0.0–0.5)
EOSINOPHILS RELATIVE PERCENT: 0.3 %
HEMATOCRIT: 24.7 % — ABNORMAL LOW (ref 34.0–44.0)
HEMOGLOBIN: 8.4 g/dL — ABNORMAL LOW (ref 11.3–14.9)
LYMPHOCYTES ABSOLUTE COUNT: 0.3 10*9/L — ABNORMAL LOW (ref 1.1–3.6)
LYMPHOCYTES RELATIVE PERCENT: 6.7 %
MEAN CORPUSCULAR HEMOGLOBIN CONC: 33.9 g/dL (ref 32.0–36.0)
MEAN CORPUSCULAR HEMOGLOBIN: 43.3 pg — ABNORMAL HIGH (ref 25.9–32.4)
MEAN CORPUSCULAR VOLUME: 127.7 fL — ABNORMAL HIGH (ref 77.6–95.7)
MEAN PLATELET VOLUME: 8.2 fL (ref 6.8–10.7)
MONOCYTES ABSOLUTE COUNT: 0.2 10*9/L — ABNORMAL LOW (ref 0.3–0.8)
MONOCYTES RELATIVE PERCENT: 5.2 %
NEUTROPHILS ABSOLUTE COUNT: 3.3 10*9/L (ref 1.8–7.8)
NEUTROPHILS RELATIVE PERCENT: 87.3 %
PLATELET COUNT: 567 10*9/L — ABNORMAL HIGH (ref 150–450)
RED BLOOD CELL COUNT: 1.93 10*12/L — ABNORMAL LOW (ref 3.95–5.13)
RED CELL DISTRIBUTION WIDTH: 17.5 % — ABNORMAL HIGH (ref 12.2–15.2)
WBC ADJUSTED: 3.8 10*9/L (ref 3.6–11.2)

## 2024-02-17 LAB — SLIDE REVIEW

## 2024-02-17 LAB — MAGNESIUM: MAGNESIUM: 1.8 mg/dL (ref 1.6–2.6)

## 2024-02-17 NOTE — Unmapped (Addendum)
 It was a pleasure to see you in the [facility].    Please continue your present medications.    Thank you for choosing [facility] for your healthcare needs.    We are committed to delivering exceptional care and continuously improving our services. After your visit, you may receive a brief survey by text or email regarding your experience.    We invite you to share your thoughts about the care you received today and you???d like to recognize a team member who made your visit special, your feedback is truly appreciated.    Provider Name: Dale LITTIE Pennant, MD   Care Partner Name: Zeljko Simeunovic, RN    When the patient survey arrives, we hope you???ll take a few moments to complete it. Your insights help us  grow and continue to maintain a five star experience for every patient.

## 2024-02-17 NOTE — Unmapped (Unsigned)
 Cardiology Clinic Followup Note    Referring Provider: Macie Benedetta Drape, *   Primary Provider: Christell Ozell Eans, MD     Reason for Visit:  ***    Assessment & Plan:  1. 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 Heart failure with preserved left ventricular function (HFpEF)     ***    3. Pulmonary hypertension     ***        History of Present Illness:  ***    Cardiovascular History:  ***    10-yr Cardiovascular Risk:  The 10-year ASCVD risk score (Arnett DK, et al., 2019) is: 19.9%    Values used to calculate the score:      Age: 73 years      Clincally relevant sex: Female      Is Non-Hispanic African American: No      Diabetic: Yes      Tobacco smoker: No      Systolic Blood Pressure: 96 mmHg      Is BP treated: Yes      HDL Cholesterol: 35 mg/dL      Total Cholesterol: 149 mg/dL    Note: For patients with SBP <90 or >200, Total Cholesterol <130 or >320, HDL <20 or >100 which are outside of the allowable range, the calculator will use these upper or lower values to calculate the patient???s risk score.       Past Medical & Surgical History:  Reviewed in EMR.    Allergies:  Reviewed in EMR.    Pertinent Medications (complete listing reviewed in EMR):    Prior to Admission medications   Medication Sig Start Date End Date Taking? Authorizing Provider   acetaminophen  (TYLENOL  8 HOUR) 650 MG CR tablet Take 1 tablet (650 mg total) by mouth nightly.   Yes [provider]   albuterol  HFA 90 mcg/actuation inhaler Inhale 2 puffs every six (6) hours as needed for wheezing or shortness of breath. 07/10/19  Yes Liggett, Leita Norris, AGNP   allopurinol  (ZYLOPRIM ) 100 MG tablet Take 2 tablets (200 mg total) by mouth daily. 12/29/23  Yes Detwiler, Benedetta Drape, MD   amlodipine  (NORVASC ) 5 MG tablet Take 1 tablet by mouth once daily 12/01/23  Yes Detwiler, Benedetta Drape, MD   aspirin  (ECOTRIN) 81 MG tablet Take 1 tablet (81 mg total) by mouth daily.   Yes [provider] carvedilol  (COREG ) 25 MG tablet Take 1 tablet (25 mg total) by mouth two (2) times a day. 12/29/23  Yes Detwiler, Benedetta Drape, MD   cephalexin  (KEFLEX ) 250 MG capsule Take 1 capsule (250 mg total) by mouth daily. 12/29/23  Yes Detwiler, Benedetta Drape, MD   cholecalciferol , vitamin D3 25 mcg, 1,000 units,, 1,000 unit (25 mcg) tablet Take 1 tablet (25 mcg total) by mouth daily. 12/29/23  Yes Detwiler, Benedetta Drape, MD   colchicine  (COLCRYS ) 0.6 mg tablet Take 1 tablet (0.6 mg total) by mouth two (2) times a day as needed. For acute gout, take 2 tablets by mouth, then 1 tablet one hour later on first day. May continue 1 tablet up to twice a day as needed. 12/29/23  Yes Detwiler, Benedetta Drape, MD   darbepoetin alfa -polysorbate (ARANESP ) 300 mcg/0.6 mL Syrg Inject 0.6 mL (300 mcg total) under the skin every twenty-eight (28) days.  Patient taking differently: Inject 0.6 mL (300 mcg total) under the skin every twenty-eight (28) days. 08/30/23  Yes Sawchak, Rebecca Knowlton, AGNP   empagliflozin  (JARDIANCE )  25 mg tablet Take 1 tablet (25 mg total) by mouth daily. 12/29/23  Yes Detwiler, Benedetta Drape, MD   eplerenone  (INSPRA ) 25 MG tablet Take 1 tablet (25 mg total) by mouth daily. 12/29/23  Yes Detwiler, Benedetta Drape, MD   fish oil -omega-3 fatty acids 300-1,000 mg capsule Take 1 capsule (1 g total) by mouth daily. 12/29/23  Yes Detwiler, Benedetta Drape, MD   fluticasone  propionate (FLONASE ) 50 mcg/actuation nasal spray Use 1 spray(s) in each nostril twice daily 06/07/23  Yes Detwiler, Benedetta Drape, MD   furosemide  (LASIX ) 40 MG tablet Take 1 tablet (40 mg total) by mouth in the morning. 01/03/24 01/02/25 Yes Detwiler, Benedetta Drape, MD   glucosamine-chondroitin 500-400 mg tablet Take 1 tablet by mouth Two (2) times a day.   Yes [provider]   hydroxyurea  (HYDREA ) 500 mg capsule Take 3 capsules (1500 mg total) every other day, with 2 capsules (1000 mg total) on the other days. 10/11/23  Yes Jerrye Daphne Mast, MD   INCRUSE ELLIPTA  62.5 mcg/actuation inhaler Inhale 1 puff by mouth once daily 12/01/23  Yes Primus Rosalita Agent, MD   losartan  (COZAAR ) 50 MG tablet Take 1 tablet (50 mg total) by mouth daily. 12/29/23  Yes Detwiler, Benedetta Drape, MD   melatonin 10 mg Tab Take 1 tablet (10 mg total) by mouth nightly.   Yes [provider]   MYFORTIC  180 mg EC tablet Take 2 tablets (360 mg total) by mouth two (2) times a day. 12/29/23 12/28/24 Yes Detwiler, Benedetta Drape, MD   pantoprazole  (PROTONIX ) 40 MG tablet Take 1 tablet (40 mg total) by mouth daily. 12/29/23  Yes Detwiler, Benedetta Drape, MD   pravastatin  (PRAVACHOL ) 40 MG tablet Take 1 tablet (40 mg total) by mouth daily. 12/29/23  Yes Detwiler, Benedetta Drape, MD   PROGRAF  0.5 mg capsule Take 2 capsules (1 mg total) by mouth in the morning. Take 1 mg in morning and 0.5 mg in evening.  Patient taking differently: Take 2 capsules (1 mg total) by mouth in the morning. Take 1 mg in morning and 0.5 mg in evening. 12/29/23  Yes Detwiler, Benedetta Drape, MD   sodium bicarbonate  650 mg tablet Take 1 tablet (650 mg total) by mouth two (2) times a day. NEW DOSE 12/29/23  Yes Detwiler, Benedetta Drape, MD   zolpidem  (AMBIEN ) 5 MG tablet Take 1 tablet (5 mg total) by mouth nightly as needed. 07/01/22  Yes Detwiler, Benedetta Drape, MD   MEDICAL SUPPLY ITEM 14 cm into each nostril nightly. CPAP Auto titrating machine 14cmH2O 3PR  [employer] is patient's DME provider.    [provider]   metFORMIN  (GLUCOPHAGE ) 500 MG tablet Take 1 tablet (500 mg total) by mouth in the morning and 1 tablet (500 mg total) in the evening. Take with meals.  Patient not taking: Reported on 02/17/2024 12/29/23   Macie Benedetta Drape, MD       Review of Systems:  ***    Physical Exam:  VITAL SIGNS: BP 96/52 (BP Site: L Arm, BP Position: Sitting, BP Cuff Size: Large)  - Pulse 65  - Resp 18  - Ht 165.1 cm (5' 5)  - Wt 88.9 kg (196 lb)  - SpO2 97%  - BMI 32.62 kg/m??   GENERAL: Appears comfortable.   NECK: Jugular venous pressure ***. Normal carotid upstrokes.  CARDIOVASCULAR: ***  RESPIRATORY: Normal respiratory effort without use of accessory muscles. Clear to auscultation bilaterally.  EXTREMITIES:  Palpable dorsalis pedis and posterior tibial pulses. ***  peripheral edema.    Pertinent Laboratory Studies:   Lab Results   Component Value Date    Triglycerides 169 (H) 12/16/2023    Triglycerides 107 09/17/2014    HDL 42 09/17/2014    Cholesterol, HDL 35 (L) 12/16/2023    Cholesterol, Non-HDL, Calculated 114 12/16/2023    LDL Calculated 90 09/17/2014    Cholesterol, LDL, Calculated 88 12/16/2023    Creatinine 1.66 (H) 02/02/2024    Creatinine 1.63 (H) 09/26/2015    Potassium 4.0 02/02/2024    Potassium 4.7 09/26/2015       Pertinent Test Results:

## 2024-02-18 LAB — TACROLIMUS LEVEL, TROUGH: TACROLIMUS, TROUGH: 7.1 ng/mL (ref 5.0–15.0)

## 2024-02-20 NOTE — Unmapped (Signed)
 I spoke with patient Erin Welch to confirm appointments on the following date(s):     02/22/24    [name]

## 2024-02-20 NOTE — Unmapped (Signed)
 Copied from CRM [id number]. Topic: Return Scheduling - Return Request  >> Feb 20, 2024 10:14 AM Almeda NOVAK wrote:      Hello,    We received a call regarding patient Erin Welch. Caller is requesting the following return appointments: labs and follow up appt.    Please reach out to the patient to provide further assistance. She would like an afternoon appt    Thank you,   Almeda GORMAN Avena  [facility]  [phone]

## 2024-02-20 NOTE — Unmapped (Signed)
 Copied from CRM #[id number]. Topic: Return Scheduling - Infusion Request  >> Feb 20, 2024 12:29 PM Gwendlyn FALCON wrote:  Patient Erin Welch has called the phone room to reschedule their Infusion Appointment.     Patient's preference is to cancel 02/21/24 transfusion and 02/23/24 transfusion. Patient does not want appointments.    Thank you,  Gwendlyn Janne Coup  [facility]  [phone]

## 2024-02-20 NOTE — Unmapped (Signed)
 Copied from CRM #[id number]. Topic: Care Management - Discuss Care Plan  >> Feb 20, 2024 10:12 AM Almeda NOVAK wrote:      Erskin Arrant contacted the Communication Center requesting to speak with the care team of [facility] to discuss:    Requests a call back from [employer]    Please contact Arrant  at [phone].    Thank you,   Almeda GORMAN Avena  [facility] Communication Center   [phone]

## 2024-02-20 NOTE — Unmapped (Signed)
 12:41 - returned call to Ms. Chabot. She currently had lab check scheduled for 8/12, 8/13, and 8/14. She would like the 8/12 and 8/14 appointments cancelled.     She would like to keep the 8/13 lab, visit with Rhoda Adjutant, AGNP, and infusion appointment.

## 2024-02-21 NOTE — Unmapped (Signed)
 Specialty Medication(s): Aranesp     Ms.Barefoot has been dis-enrolled from the [facility] specialty pharmacy services as a result of patient's medication is no longer on the [facility] Specialty Drug List..    Additional information provided to the patient:      Yadir Zentner M Yitzhak Awan, PharmD  [facility] Specialty Pharmacist

## 2024-02-22 ENCOUNTER — Ambulatory Visit: Admit: 2024-02-22 | Discharge: 2024-02-22 | Payer: MEDICARE | Attending: Adult Health | Primary: Adult Health

## 2024-02-22 ENCOUNTER — Inpatient Hospital Stay: Admit: 2024-02-22 | Discharge: 2024-02-22 | Payer: MEDICARE

## 2024-02-22 ENCOUNTER — Other Ambulatory Visit: Admit: 2024-02-22 | Discharge: 2024-02-22 | Payer: MEDICARE

## 2024-02-22 DIAGNOSIS — N1832 Anemia of chronic renal failure, stage 3b (CMS-HCC): Principal | ICD-10-CM

## 2024-02-22 DIAGNOSIS — I272 Pulmonary hypertension, unspecified: Principal | ICD-10-CM

## 2024-02-22 DIAGNOSIS — D631 Anemia in chronic kidney disease: Principal | ICD-10-CM

## 2024-02-22 DIAGNOSIS — Z940 Kidney transplant status: Principal | ICD-10-CM

## 2024-02-22 DIAGNOSIS — Z79899 Other long term (current) drug therapy: Principal | ICD-10-CM

## 2024-02-22 DIAGNOSIS — D473 Essential (hemorrhagic) thrombocythemia: Principal | ICD-10-CM

## 2024-02-25 DIAGNOSIS — J449 Chronic obstructive pulmonary disease, unspecified: Principal | ICD-10-CM

## 2024-02-25 MED ORDER — INCRUSE ELLIPTA 62.5 MCG/ACTUATION POWDER FOR INHALATION
RESPIRATORY_TRACT | 0 refills | 0.00000 days
Start: 2024-02-25 — End: ?

## 2024-02-27 MED ORDER — INCRUSE ELLIPTA 62.5 MCG/ACTUATION POWDER FOR INHALATION
RESPIRATORY_TRACT | 0 refills | 0.00000 days | Status: CP
Start: 2024-02-27 — End: ?

## 2024-03-02 MED FILL — ARANESP 300 MCG/0.6 ML (IN POLYSORBATE) INJECTION SYRINGE: SUBCUTANEOUS | 28 days supply | Qty: 0.6 | Fill #4

## 2024-03-22 ENCOUNTER — Ambulatory Visit: Admit: 2024-03-22 | Discharge: 2024-03-23 | Payer: MEDICARE

## 2024-04-02 MED FILL — ARANESP 300 MCG/0.6 ML (IN POLYSORBATE) INJECTION SYRINGE: SUBCUTANEOUS | 28 days supply | Qty: 0.6 | Fill #5

## 2024-04-04 ENCOUNTER — Ambulatory Visit: Admit: 2024-04-04 | Discharge: 2024-04-05 | Payer: MEDICARE

## 2024-04-06 ENCOUNTER — Ambulatory Visit: Admit: 2024-04-06 | Discharge: 2024-04-07 | Payer: MEDICARE

## 2024-04-06 ENCOUNTER — Encounter: Admit: 2024-04-06 | Discharge: 2024-04-07 | Payer: MEDICARE | Attending: Adult Health | Primary: Adult Health

## 2024-04-06 DIAGNOSIS — N1832 Anemia of chronic renal failure, stage 3b (CMS-HCC): Principal | ICD-10-CM

## 2024-04-06 DIAGNOSIS — N183 Anemia of chronic renal failure, stage 3 (moderate), unspecified whether stage 3a or 3b CKD (CMS-HCC): Principal | ICD-10-CM

## 2024-04-06 DIAGNOSIS — Z79899 Other long term (current) drug therapy: Principal | ICD-10-CM

## 2024-04-06 DIAGNOSIS — Z940 Kidney transplant status: Principal | ICD-10-CM

## 2024-04-06 DIAGNOSIS — D631 Anemia in chronic kidney disease: Principal | ICD-10-CM

## 2024-04-07 ENCOUNTER — Inpatient Hospital Stay: Admit: 2024-04-07 | Discharge: 2024-04-08 | Payer: MEDICARE

## 2024-04-23 DIAGNOSIS — D631 Anemia in chronic kidney disease: Principal | ICD-10-CM

## 2024-04-23 DIAGNOSIS — N1832 Anemia of chronic renal failure, stage 3b (CMS-HCC): Principal | ICD-10-CM

## 2024-04-23 DIAGNOSIS — Z940 Kidney transplant status: Principal | ICD-10-CM

## 2024-04-23 DIAGNOSIS — Z79899 Other long term (current) drug therapy: Principal | ICD-10-CM

## 2024-04-26 ENCOUNTER — Ambulatory Visit: Admit: 2024-04-26 | Discharge: 2024-04-27 | Payer: MEDICARE

## 2024-04-27 ENCOUNTER — Ambulatory Visit: Admit: 2024-04-27 | Discharge: 2024-04-28 | Payer: MEDICARE | Attending: Nephrology | Primary: Nephrology

## 2024-04-27 DIAGNOSIS — Z940 Kidney transplant status: Principal | ICD-10-CM

## 2024-04-27 DIAGNOSIS — D631 Anemia in chronic kidney disease: Principal | ICD-10-CM

## 2024-04-27 DIAGNOSIS — Z79899 Other long term (current) drug therapy: Principal | ICD-10-CM

## 2024-04-27 DIAGNOSIS — N1832 Anemia of chronic renal failure, stage 3b (CMS-HCC): Principal | ICD-10-CM

## 2024-04-27 MED ORDER — CHOLECALCIFEROL (VITAMIN D3) 25 MCG (1,000 UNIT) TABLET
ORAL_TABLET | Freq: Every day | ORAL | 3 refills | 45.00000 days
Start: 2024-04-27 — End: ?

## 2024-05-01 MED FILL — ARANESP 300 MCG/0.6 ML (IN POLYSORBATE) INJECTION SYRINGE: SUBCUTANEOUS | 28 days supply | Qty: 0.6 | Fill #6

## 2024-05-03 MED ORDER — ZOLPIDEM 5 MG TABLET
ORAL_TABLET | Freq: Every evening | ORAL | 3 refills | 30.00000 days | Status: CP | PRN
Start: 2024-05-03 — End: ?

## 2024-05-07 ENCOUNTER — Ambulatory Visit: Admit: 2024-05-07 | Discharge: 2024-05-08 | Payer: MEDICARE

## 2024-05-07 DIAGNOSIS — R0600 Dyspnea, unspecified: Principal | ICD-10-CM

## 2024-05-07 DIAGNOSIS — Z79899 Other long term (current) drug therapy: Principal | ICD-10-CM

## 2024-05-07 DIAGNOSIS — R911 Solitary pulmonary nodule: Principal | ICD-10-CM

## 2024-05-07 DIAGNOSIS — I5030 Unspecified diastolic (congestive) heart failure: Principal | ICD-10-CM

## 2024-05-07 DIAGNOSIS — G4733 Obstructive sleep apnea (adult) (pediatric): Principal | ICD-10-CM

## 2024-05-07 DIAGNOSIS — Z23 Encounter for immunization: Principal | ICD-10-CM

## 2024-05-07 DIAGNOSIS — J449 Chronic obstructive pulmonary disease, unspecified: Principal | ICD-10-CM

## 2024-05-07 DIAGNOSIS — Z940 Kidney transplant status: Principal | ICD-10-CM

## 2024-05-07 MED ORDER — BREZTRI AEROSPHERE 160 MCG-9MCG-4.8MCG/ACTUATION HFA AEROSOL INHALER
Freq: Two times a day (BID) | RESPIRATORY_TRACT | 3 refills | 0.00000 days | Status: CP
Start: 2024-05-07 — End: ?

## 2024-05-08 DIAGNOSIS — L578 Other skin changes due to chronic exposure to nonionizing radiation: Principal | ICD-10-CM

## 2024-05-08 DIAGNOSIS — Z85828 Personal history of other malignant neoplasm of skin: Principal | ICD-10-CM

## 2024-05-08 DIAGNOSIS — L570 Actinic keratosis: Principal | ICD-10-CM

## 2024-05-08 DIAGNOSIS — Z9225 Personal history of immunosupression therapy: Principal | ICD-10-CM

## 2024-05-09 MED ORDER — METFORMIN ER 500 MG TABLET,EXTENDED RELEASE 24 HR
ORAL_TABLET | Freq: Every day | ORAL | 3 refills | 100.00000 days | Status: CP
Start: 2024-05-09 — End: 2025-06-13

## 2024-05-11 ENCOUNTER — Encounter: Admit: 2024-05-11 | Discharge: 2024-05-11 | Payer: MEDICARE | Attending: Adult Health | Primary: Adult Health

## 2024-05-11 ENCOUNTER — Other Ambulatory Visit: Admit: 2024-05-11 | Discharge: 2024-05-11 | Payer: MEDICARE

## 2024-05-11 ENCOUNTER — Inpatient Hospital Stay: Admit: 2024-05-11 | Discharge: 2024-05-11 | Payer: MEDICARE

## 2024-05-11 DIAGNOSIS — N1832 Anemia of chronic renal failure, stage 3b (CMS-HCC): Principal | ICD-10-CM

## 2024-05-11 DIAGNOSIS — D631 Anemia in chronic kidney disease: Principal | ICD-10-CM

## 2024-05-11 DIAGNOSIS — N183 Anemia of chronic renal failure, stage 3 (moderate), unspecified whether stage 3a or 3b CKD (CMS-HCC): Principal | ICD-10-CM

## 2024-05-21 ENCOUNTER — Ambulatory Visit: Admit: 2024-05-21 | Discharge: 2024-05-22 | Payer: MEDICARE

## 2024-05-22 ENCOUNTER — Encounter: Admit: 2024-05-22 | Discharge: 2024-05-23 | Payer: MEDICARE | Attending: Adult Health | Primary: Adult Health

## 2024-05-22 DIAGNOSIS — Z940 Kidney transplant status: Principal | ICD-10-CM

## 2024-05-22 DIAGNOSIS — N1832 Anemia of chronic renal failure, stage 3b (CMS-HCC): Principal | ICD-10-CM

## 2024-05-22 DIAGNOSIS — I272 Pulmonary hypertension, unspecified: Principal | ICD-10-CM

## 2024-05-22 DIAGNOSIS — I5089 Other heart failure: Principal | ICD-10-CM

## 2024-05-22 DIAGNOSIS — N183 Anemia of chronic renal failure, stage 3 (moderate), unspecified whether stage 3a or 3b CKD (CMS-HCC): Principal | ICD-10-CM

## 2024-05-22 DIAGNOSIS — D631 Anemia in chronic kidney disease: Principal | ICD-10-CM

## 2024-05-22 DIAGNOSIS — I5030 Unspecified diastolic (congestive) heart failure: Principal | ICD-10-CM

## 2024-05-22 DIAGNOSIS — D473 Essential (hemorrhagic) thrombocythemia: Principal | ICD-10-CM

## 2024-05-22 MED ORDER — DARBEPOETIN ALFA 300 MCG/0.6 ML IN POLYSORBATE INJECTION SYRINGE
SUBCUTANEOUS | 11 refills | 28.00000 days | Status: CP
Start: 2024-05-22 — End: ?
  Filled 2024-05-28: qty 1.2, 28d supply, fill #0

## 2024-05-23 MED ORDER — FUROSEMIDE 40 MG TABLET
ORAL_TABLET | Freq: Every day | ORAL | 4 refills | 90.00000 days | Status: CP
Start: 2024-05-23 — End: 2025-05-23

## 2024-05-29 DIAGNOSIS — D473 Essential (hemorrhagic) thrombocythemia: Principal | ICD-10-CM

## 2024-05-29 MED ORDER — HYDROXYUREA 500 MG CAPSULE
ORAL_CAPSULE | 0 refills | 0.00000 days
Start: 2024-05-29 — End: ?

## 2024-05-30 MED ORDER — HYDROXYUREA 500 MG CAPSULE
ORAL_CAPSULE | ORAL | 3 refills | 0.00000 days | Status: CP
Start: 2024-05-30 — End: ?

## 2024-06-05 ENCOUNTER — Ambulatory Visit: Admit: 2024-06-05 | Discharge: 2024-06-06 | Payer: MEDICARE

## 2024-06-06 DIAGNOSIS — Z940 Kidney transplant status: Principal | ICD-10-CM

## 2024-06-06 DIAGNOSIS — J441 Chronic obstructive pulmonary disease with (acute) exacerbation: Principal | ICD-10-CM

## 2024-06-06 DIAGNOSIS — Z79899 Other long term (current) drug therapy: Principal | ICD-10-CM

## 2024-06-20 ENCOUNTER — Ambulatory Visit
Admit: 2024-06-20 | Discharge: 2024-06-20 | Payer: MEDICARE | Attending: Student in an Organized Health Care Education/Training Program | Primary: Student in an Organized Health Care Education/Training Program

## 2024-06-20 ENCOUNTER — Ambulatory Visit: Admit: 2024-06-20 | Discharge: 2024-06-20 | Payer: MEDICARE

## 2024-06-20 DIAGNOSIS — E1169 Type 2 diabetes mellitus with other specified complication: Principal | ICD-10-CM

## 2024-06-20 DIAGNOSIS — I10 Essential (primary) hypertension: Principal | ICD-10-CM

## 2024-06-20 DIAGNOSIS — G4700 Insomnia, unspecified: Principal | ICD-10-CM

## 2024-06-20 DIAGNOSIS — I5030 Unspecified diastolic (congestive) heart failure: Principal | ICD-10-CM

## 2024-06-20 DIAGNOSIS — Z940 Kidney transplant status: Principal | ICD-10-CM

## 2024-06-20 DIAGNOSIS — D473 Essential (hemorrhagic) thrombocythemia: Principal | ICD-10-CM

## 2024-06-21 DIAGNOSIS — E1169 Type 2 diabetes mellitus with other specified complication: Principal | ICD-10-CM

## 2024-06-27 MED FILL — ARANESP 300 MCG/0.6 ML (IN POLYSORBATE) INJECTION SYRINGE: SUBCUTANEOUS | 28 days supply | Qty: 1.2 | Fill #1

## 2024-07-13 DIAGNOSIS — Z940 Kidney transplant status: Principal | ICD-10-CM

## 2024-07-19 ENCOUNTER — Ambulatory Visit: Admit: 2024-07-19 | Discharge: 2024-07-20 | Payer: MEDICARE

## 2024-07-27 DIAGNOSIS — N183 Anemia of chronic renal failure, stage 3 (moderate), unspecified whether stage 3a or 3b CKD (CMS-HCC): Principal | ICD-10-CM

## 2024-07-27 DIAGNOSIS — D631 Anemia in chronic kidney disease: Principal | ICD-10-CM

## 2024-08-03 ENCOUNTER — Ambulatory Visit: Admit: 2024-08-03 | Discharge: 2024-08-11 | Payer: MEDICARE | Attending: Nephrology | Primary: Nephrology

## 2024-08-03 ENCOUNTER — Inpatient Hospital Stay: Admit: 2024-08-03 | Discharge: 2024-08-04 | Payer: MEDICARE

## 2024-08-03 ENCOUNTER — Ambulatory Visit: Admit: 2024-08-03 | Discharge: 2024-08-11 | Payer: MEDICARE

## 2024-08-06 DIAGNOSIS — Z940 Kidney transplant status: Principal | ICD-10-CM

## 2024-08-06 DIAGNOSIS — Z79899 Other long term (current) drug therapy: Secondary | ICD-10-CM

## 2024-08-09 DIAGNOSIS — E118 Type 2 diabetes mellitus with unspecified complications: Secondary | ICD-10-CM

## 2024-08-09 DIAGNOSIS — Z940 Kidney transplant status: Principal | ICD-10-CM

## 2024-08-09 DIAGNOSIS — Z79899 Other long term (current) drug therapy: Secondary | ICD-10-CM

## 2024-08-09 MED ORDER — METFORMIN ER 500 MG TABLET,EXTENDED RELEASE 24 HR
ORAL_TABLET | Freq: Every day | ORAL | 3 refills | 100.00000 days | Status: CP
Start: 2024-08-09 — End: 2025-09-13

## 2024-08-15 MED FILL — ARANESP 300 MCG/0.6 ML (IN POLYSORBATE) INJECTION SYRINGE: SUBCUTANEOUS | 28 days supply | Qty: 1.2 | Fill #2
